# Patient Record
Sex: Male | Born: 1960
Health system: Southern US, Community
[De-identification: ages and names within clinical notes are randomized; demographics above are authoritative.]

## PROBLEM LIST (undated history)

## (undated) DIAGNOSIS — F32A Depression, unspecified: Secondary | ICD-10-CM

## (undated) DIAGNOSIS — K219 Gastro-esophageal reflux disease without esophagitis: Secondary | ICD-10-CM

## (undated) DIAGNOSIS — F329 Major depressive disorder, single episode, unspecified: Secondary | ICD-10-CM

## (undated) DIAGNOSIS — J302 Other seasonal allergic rhinitis: Secondary | ICD-10-CM

## (undated) HISTORY — DX: Depression, unspecified: F32.A

## (undated) HISTORY — DX: Gastro-esophageal reflux disease without esophagitis: K21.9

## (undated) HISTORY — DX: Major depressive disorder, single episode, unspecified: F32.9

---

## 2001-02-21 ENCOUNTER — Emergency Department (HOSPITAL_COMMUNITY): Admission: EM | Admit: 2001-02-21 | Discharge: 2001-02-21 | Payer: Self-pay | Admitting: Emergency Medicine

## 2001-02-21 ENCOUNTER — Encounter: Payer: Self-pay | Admitting: Emergency Medicine

## 2004-08-03 ENCOUNTER — Emergency Department (HOSPITAL_COMMUNITY): Admission: EM | Admit: 2004-08-03 | Discharge: 2004-08-03 | Payer: Self-pay | Admitting: *Deleted

## 2006-08-13 ENCOUNTER — Emergency Department (HOSPITAL_COMMUNITY): Admission: EM | Admit: 2006-08-13 | Discharge: 2006-08-13 | Payer: Self-pay | Admitting: Emergency Medicine

## 2006-09-19 ENCOUNTER — Emergency Department (HOSPITAL_COMMUNITY): Admission: EM | Admit: 2006-09-19 | Discharge: 2006-09-19 | Payer: Self-pay | Admitting: Emergency Medicine

## 2009-06-28 ENCOUNTER — Emergency Department (HOSPITAL_COMMUNITY): Admission: EM | Admit: 2009-06-28 | Discharge: 2009-06-30 | Payer: Self-pay | Admitting: Emergency Medicine

## 2010-11-11 LAB — DIFFERENTIAL
Basophils Absolute: 0 10*3/uL (ref 0.0–0.1)
Basophils Relative: 0 % (ref 0–1)
Eosinophils Absolute: 0.1 10*3/uL (ref 0.0–0.7)
Eosinophils Relative: 1 % (ref 0–5)
Lymphocytes Relative: 31 % (ref 12–46)
Lymphs Abs: 1.7 10*3/uL (ref 0.7–4.0)
Monocytes Absolute: 0.2 10*3/uL (ref 0.1–1.0)
Monocytes Relative: 4 % (ref 3–12)
Neutro Abs: 3.4 10*3/uL (ref 1.7–7.7)
Neutrophils Relative %: 64 % (ref 43–77)

## 2010-11-11 LAB — BASIC METABOLIC PANEL
BUN: 8 mg/dL (ref 6–23)
CO2: 29 mEq/L (ref 19–32)
Calcium: 9.7 mg/dL (ref 8.4–10.5)
Chloride: 107 mEq/L (ref 96–112)
Creatinine, Ser: 0.84 mg/dL (ref 0.4–1.5)
GFR calc Af Amer: >60 mL/min (ref >60)
GFR calc non Af Amer: >60 mL/min (ref >60)
Glucose, Bld: 87 mg/dL (ref 70–99)
Potassium: 3.9 mEq/L (ref 3.5–5.1)
Sodium: 143 mEq/L (ref 135–145)

## 2010-11-11 LAB — CBC
HCT: 46.9 % (ref 39.0–52.0)
Hemoglobin: 15.5 g/dL (ref 13.0–17.0)
MCHC: 33.1 g/dL (ref 30.0–36.0)
MCV: 92.9 fL (ref 78.0–100.0)
Platelets: 268 10*3/uL (ref 150–400)
RBC: 5.05 MIL/uL (ref 4.22–5.81)
RDW: 13.8 % (ref 11.5–15.5)
WBC: 5.4 10*3/uL (ref 4.0–10.5)

## 2010-11-11 LAB — RAPID URINE DRUG SCREEN, HOSP PERFORMED
Amphetamines: NOT DETECTED
Barbiturates: NOT DETECTED
Benzodiazepines: NOT DETECTED
Cocaine: NOT DETECTED
Opiates: NOT DETECTED
Tetrahydrocannabinol: POSITIVE — AB

## 2010-11-11 LAB — ETHANOL: Alcohol, Ethyl (B): <5 mg/dL (ref 0–10)

## 2012-09-03 ENCOUNTER — Emergency Department (HOSPITAL_COMMUNITY)
Admission: EM | Admit: 2012-09-03 | Discharge: 2012-09-03 | Disposition: A | Discharge status: Home / Self Care | Payer: Self-pay | Attending: Emergency Medicine | Admitting: Emergency Medicine

## 2012-09-03 ENCOUNTER — Encounter (HOSPITAL_COMMUNITY): Payer: Self-pay | Admitting: Emergency Medicine

## 2012-09-03 DIAGNOSIS — R112 Nausea with vomiting, unspecified: Secondary | ICD-10-CM | POA: Insufficient documentation

## 2012-09-03 DIAGNOSIS — R42 Dizziness and giddiness: Secondary | ICD-10-CM | POA: Insufficient documentation

## 2012-09-03 DIAGNOSIS — R001 Bradycardia, unspecified: Secondary | ICD-10-CM

## 2012-09-03 DIAGNOSIS — R1033 Periumbilical pain: Secondary | ICD-10-CM | POA: Insufficient documentation

## 2012-09-03 DIAGNOSIS — F17200 Nicotine dependence, unspecified, uncomplicated: Secondary | ICD-10-CM | POA: Insufficient documentation

## 2012-09-03 DIAGNOSIS — R109 Unspecified abdominal pain: Secondary | ICD-10-CM

## 2012-09-03 DIAGNOSIS — I498 Other specified cardiac arrhythmias: Secondary | ICD-10-CM | POA: Insufficient documentation

## 2012-09-03 DIAGNOSIS — R1110 Vomiting, unspecified: Secondary | ICD-10-CM

## 2012-09-03 LAB — COMPREHENSIVE METABOLIC PANEL
AST: 19 U/L (ref 0–37)
Albumin: 3.5 g/dL (ref 3.5–5.2)
CO2: 27 mEq/L (ref 19–32)
Calcium: 9 mg/dL (ref 8.4–10.5)
Creatinine, Ser: 0.95 mg/dL (ref 0.50–1.35)
GFR calc non Af Amer: >90 mL/min (ref >90)

## 2012-09-03 LAB — CBC WITH DIFFERENTIAL/PLATELET
Basophils Absolute: 0 10*3/uL (ref 0.0–0.1)
Basophils Relative: 0 % (ref 0–1)
Eosinophils Relative: 1 % (ref 0–5)
HCT: 41.2 % (ref 39.0–52.0)
MCH: 30.5 pg (ref 26.0–34.0)
MCHC: 33.5 g/dL (ref 30.0–36.0)
MCV: 90.9 fL (ref 78.0–100.0)
Monocytes Absolute: 0.4 10*3/uL (ref 0.1–1.0)
RDW: 12.9 % (ref 11.5–15.5)

## 2012-09-03 LAB — URINALYSIS, ROUTINE W REFLEX MICROSCOPIC
Glucose, UA: NEGATIVE mg/dL
Leukocytes, UA: NEGATIVE
Protein, ur: 30 mg/dL — AB

## 2012-09-03 LAB — ETHANOL: Alcohol, Ethyl (B): <11 mg/dL (ref 0–11)

## 2012-09-03 LAB — URINE MICROSCOPIC-ADD ON

## 2012-09-03 MED ORDER — ONDANSETRON HCL 4 MG/2ML IJ SOLN
4.0000 mg | Freq: Once | INTRAMUSCULAR | Status: AC
  Administered 2012-09-03: 4 mg via INTRAVENOUS
  Filled 2012-09-03: qty 2

## 2012-09-03 NOTE — ED Provider Notes (Signed)
History     CSN: [id number]  Arrival date & time 09/03/12  0043   First MD Initiated Contact with Patient 09/03/12 0103      Chief Complaint  Patient presents with  . Abdominal Pain     Patient is a 52 y.o. male presenting with abdominal pain. The history is provided by the patient.  Abdominal Pain The primary symptoms of the illness include abdominal pain, nausea and vomiting. The primary symptoms of the illness do not include fever, shortness of breath, diarrhea, hematochezia or dysuria. The current episode started 1 to 2 hours ago. The onset of the illness was sudden. The problem has been rapidly improving.  The patient has not had a change in bowel habit. Symptoms associated with the illness do not include constipation or back pain.  pt reports he ate fish, and soon after developed periumbilical pain and vomiting.  He denies diarrhea - he reports he actually had normal nonbloody BM.  No cp.  No sob.  No syncope.  He reports he did have brief dizziness immediately after vomiting but now resolved.  He reports his symptoms are essentially resolved.  He is otherwise well without any medical problems  PMH - none  Past Surgical History  Procedure Date  . No past surgeries     History reviewed. No pertinent family history.  History  Substance Use Topics  . Smoking status: Current Every Day Smoker -- 1.0 packs/day  . Smokeless tobacco: Not on file  . Alcohol Use: No      Review of Systems  Constitutional: Negative for fever.  Respiratory: Negative for chest tightness and shortness of breath.   Cardiovascular: Negative for chest pain.  Gastrointestinal: Positive for nausea, vomiting and abdominal pain. Negative for diarrhea, constipation, blood in stool and hematochezia.  Genitourinary: Negative for dysuria and testicular pain.  Musculoskeletal: Negative for back pain.  Neurological: Negative for syncope and weakness.  Psychiatric/Behavioral: Negative for agitation.  All  other systems reviewed and are negative.    Allergies  Review of patient's allergies indicates no known allergies.  Home Medications  No current outpatient prescriptions on file.  BP 108/42  Pulse 43  Temp 97.7 F (36.5 C) (Oral)  Resp 18  SpO2 100%  Physical Exam CONSTITUTIONAL: Well developed/well nourished HEAD AND FACE: Normocephalic/atraumatic EYES: EOMI/PERRL, no icterus ENMT: Mucous membranes moist NECK: supple no meningeal signs SPINE:entire spine nontender CV: S1/S2 noted, no murmurs/rubs/gallops noted LUNGS: Lungs are clear to auscultation bilaterally, no apparent distress ABDOMEN: soft, nontender, no rebound or guarding, +BS GU:no cva tenderness NEURO: Pt is awake/alert, moves all extremitiesx4 EXTREMITIES: pulses normal, full ROM SKIN: warm, color normal PSYCH: no abnormalities of mood noted  ED Course  Procedures    Labs Reviewed  CBC WITH DIFFERENTIAL  COMPREHENSIVE METABOLIC PANEL  LIPASE, BLOOD  URINALYSIS, ROUTINE W REFLEX MICROSCOPIC   By the time of my eval pt was already feeling better He had no symptoms here.  His abdominal exam was benign without focal tenderness on repeat exam.  He had no vomiting.  He reports feeling well.  Labs reassuring.  He had no lower abdominal/inguinal pain.  My suspicion for acute abdominal  process is low.  I gave him strict return precautions and what to monitor for over next 8-12 hours  For his sinus bradycardia - he is asymptomatic without any dizziness/syncope.  His HR quickly improves while in the room.  I have referred him to cardiology for this.     MDM  Nursing notes including past medical history and social history reviewed and considered in documentation Labs/vital reviewed and considered        Date: 09/03/2012  Rate: 41  Rhythm: sinus bradycardia  QRS Axis: normal  Intervals: normal  ST/T Wave abnormalities: normal  Conduction Disutrbances:none  Narrative Interpretation:   Old EKG Reviewed:  none available at time of interpretation    Joya Gaskins, MD 09/03/12 [phone]

## 2012-09-03 NOTE — ED Notes (Signed)
Pt states no pain at this time. Pt denies any complaints at this time.

## 2012-09-03 NOTE — ED Notes (Signed)
Patient complaining of abdominal pain, nausea, and vomiting that started two hours ago.  Denies diarrhea and changes in urine.  Denies chest pain.

## 2012-09-10 ENCOUNTER — Emergency Department (HOSPITAL_COMMUNITY)
Admission: EM | Admit: 2012-09-10 | Discharge: 2012-09-11 | Disposition: A | Discharge status: Home / Self Care | Payer: Self-pay | Attending: Emergency Medicine | Admitting: Emergency Medicine

## 2012-09-10 ENCOUNTER — Encounter (HOSPITAL_COMMUNITY): Payer: Self-pay

## 2012-09-10 DIAGNOSIS — R112 Nausea with vomiting, unspecified: Secondary | ICD-10-CM | POA: Insufficient documentation

## 2012-09-10 DIAGNOSIS — R509 Fever, unspecified: Secondary | ICD-10-CM | POA: Insufficient documentation

## 2012-09-10 DIAGNOSIS — K4090 Unilateral inguinal hernia, without obstruction or gangrene, not specified as recurrent: Secondary | ICD-10-CM | POA: Insufficient documentation

## 2012-09-10 DIAGNOSIS — R109 Unspecified abdominal pain: Secondary | ICD-10-CM | POA: Insufficient documentation

## 2012-09-10 DIAGNOSIS — F17200 Nicotine dependence, unspecified, uncomplicated: Secondary | ICD-10-CM | POA: Insufficient documentation

## 2012-09-10 NOTE — ED Notes (Signed)
Patient presents with c/o abdominal pain, nausea and vomiting that started about 10 pm. States that he ate some homemade baked beans around 4 pm although no one else who ate this is having any sx.

## 2012-09-11 ENCOUNTER — Encounter (HOSPITAL_COMMUNITY): Payer: Self-pay | Admitting: Radiology

## 2012-09-11 ENCOUNTER — Emergency Department (HOSPITAL_COMMUNITY): Payer: Self-pay

## 2012-09-11 LAB — URINALYSIS, ROUTINE W REFLEX MICROSCOPIC
Hgb urine dipstick: NEGATIVE
Ketones, ur: 15 mg/dL — AB
Leukocytes, UA: NEGATIVE
Protein, ur: NEGATIVE mg/dL
Urobilinogen, UA: 1 mg/dL (ref 0.0–1.0)

## 2012-09-11 LAB — CBC WITH DIFFERENTIAL/PLATELET
Eosinophils Relative: 0 % (ref 0–5)
HCT: 40.7 % (ref 39.0–52.0)
Hemoglobin: 13.5 g/dL (ref 13.0–17.0)
Lymphocytes Relative: 13 % (ref 12–46)
Lymphs Abs: 1.3 10*3/uL (ref 0.7–4.0)
MCV: 89.3 fL (ref 78.0–100.0)
Monocytes Absolute: 0.5 10*3/uL (ref 0.1–1.0)
Monocytes Relative: 5 % (ref 3–12)
RBC: 4.56 MIL/uL (ref 4.22–5.81)
WBC: 10.2 10*3/uL (ref 4.0–10.5)

## 2012-09-11 LAB — COMPREHENSIVE METABOLIC PANEL
CO2: 27 mEq/L (ref 19–32)
Calcium: 9.3 mg/dL (ref 8.4–10.5)
Creatinine, Ser: 0.96 mg/dL (ref 0.50–1.35)
GFR calc Af Amer: >90 mL/min (ref >90)
GFR calc non Af Amer: >90 mL/min (ref >90)
Glucose, Bld: 123 mg/dL — ABNORMAL HIGH (ref 70–99)
Total Bilirubin: 0.3 mg/dL (ref 0.3–1.2)

## 2012-09-11 MED ORDER — ONDANSETRON HCL 4 MG/2ML IJ SOLN
4.0000 mg | Freq: Once | INTRAMUSCULAR | Status: AC
  Administered 2012-09-11: 4 mg via INTRAVENOUS
  Filled 2012-09-11: qty 2

## 2012-09-11 MED ORDER — SODIUM CHLORIDE 0.9 % IV SOLN
1000.0000 mL | INTRAVENOUS | Status: DC
  Administered 2012-09-11: 1000 mL via INTRAVENOUS

## 2012-09-11 MED ORDER — SODIUM CHLORIDE 0.9 % IV SOLN
1000.0000 mL | Freq: Once | INTRAVENOUS | Status: AC
  Administered 2012-09-11: 1000 mL via INTRAVENOUS

## 2012-09-11 MED ORDER — IOHEXOL 300 MG/ML  SOLN
50.0000 mL | Freq: Once | INTRAMUSCULAR | Status: AC | PRN
  Administered 2012-09-11: 50 mL via ORAL

## 2012-09-11 MED ORDER — METOCLOPRAMIDE HCL 10 MG PO TABS: 10.0000 mg | ORAL_TABLET | Freq: Four times a day (QID) | ORAL | Status: DC | PRN

## 2012-09-11 MED ORDER — OXYCODONE-ACETAMINOPHEN 5-325 MG PO TABS: 1.0000 | ORAL_TABLET | ORAL | Status: DC | PRN

## 2012-09-11 MED ORDER — MORPHINE SULFATE 4 MG/ML IJ SOLN
4.0000 mg | Freq: Once | INTRAMUSCULAR | Status: DC
  Filled 2012-09-11: qty 1

## 2012-09-11 MED ORDER — IOHEXOL 300 MG/ML  SOLN
100.0000 mL | Freq: Once | INTRAMUSCULAR | Status: AC | PRN
  Administered 2012-09-11: 100 mL via INTRAVENOUS

## 2012-09-11 NOTE — ED Notes (Signed)
Patient transported to CT 

## 2012-09-11 NOTE — ED Provider Notes (Signed)
History     CSN: [id number]  Arrival date & time 09/10/12  2332   First MD Initiated Contact with Patient 09/10/12 2359      Chief Complaint  Patient presents with  . Abdominal Pain    (Consider location/radiation/quality/duration/timing/severity/associated sxs/prior treatment) Patient is a 52 y.o. male presenting with abdominal pain. The history is provided by the patient.  Abdominal Pain The primary symptoms of the illness include abdominal pain.  He states that he eats them being cystectomy in which he thinks may have been tainted. This evening, he developed crampy lower abdominal pain associated nausea and vomiting. Pain was severe and he rated it at 10/10. Pain has he's somewhat and is now rated at 5/10. He no longer is complaining of nausea. He denies diarrhea and states normal bowel movement earlier today. He had subjective fever and did break out in a sweat. He denies ethanol consumption. He did notice that pain was worse if he coughed.  History reviewed. No pertinent past medical history.  Past Surgical History  Procedure Date  . No past surgeries     No family history on file.  History  Substance Use Topics  . Smoking status: Current Every Day Smoker -- 1.0 packs/day  . Smokeless tobacco: Never Used  . Alcohol Use: No      Review of Systems  Gastrointestinal: Positive for abdominal pain.  All other systems reviewed and are negative.    Allergies  Review of patient's allergies indicates no known allergies.  Home Medications   Current Outpatient Rx  Name  Route  Sig  Dispense  Refill  . LORATADINE 10 MG PO TABS   Oral   Take 10 mg by mouth daily as needed. For allergies           BP 119/69  Pulse 46  Temp 97.4 F (36.3 C) (Oral)  Resp 18  SpO2 100%  Physical Exam  Nursing note and vitals reviewed.  52 year old male, resting comfortably and in no acute distress. Vital signs are significant for bradycardia with heart rate of 46. Oxygen  saturation is 100%, which is normal. Head is normocephalic and atraumatic. PERRLA, EOMI. Oropharynx is clear. Neck is nontender and supple without adenopathy or JVD. Back is nontender and there is no CVA tenderness. Lungs are clear without rales, wheezes, or rhonchi. Chest is nontender. Heart has regular rate and rhythm without murmur. Abdomen is soft, flat, with moderate tenderness in the left lower quadrant and mild tenderness in the right lower quadrant. There is no rebound or guarding. There are no masses or hepatosplenomegaly and peristalsis is slightly hypoactive. Extremities have no cyanosis or edema, full range of motion is present. Skin is warm and dry without rash. Neurologic: Mental status is normal, cranial nerves are intact, there are no motor or sensory deficits.  ED Course  Procedures (including critical care time)  Results for orders placed during the hospital encounter of 09/10/12  CBC WITH DIFFERENTIAL      Component Value Range   WBC 10.2  4.0 - 10.5 K/uL   RBC 4.56  4.22 - 5.81 MIL/uL   Hemoglobin 13.5  13.0 - 17.0 g/dL   HCT 84.6  96.2 - 95.2 %   MCV 89.3  78.0 - 100.0 fL   MCH 29.6  26.0 - 34.0 pg   MCHC 33.2  30.0 - 36.0 g/dL   RDW 84.1  32.4 - 40.1 %   Platelets 259  150 - 400 K/uL  Neutrophils Relative 82 (*) 43 - 77 %   Neutro Abs 8.3 (*) 1.7 - 7.7 K/uL   Lymphocytes Relative 13  12 - 46 %   Lymphs Abs 1.3  0.7 - 4.0 K/uL   Monocytes Relative 5  3 - 12 %   Monocytes Absolute 0.5  0.1 - 1.0 K/uL   Eosinophils Relative 0  0 - 5 %   Eosinophils Absolute 0.0  0.0 - 0.7 K/uL   Basophils Relative 0  0 - 1 %   Basophils Absolute 0.0  0.0 - 0.1 K/uL  COMPREHENSIVE METABOLIC PANEL      Component Value Range   Sodium 140  135 - 145 mEq/L   Potassium 3.6  3.5 - 5.1 mEq/L   Chloride 105  96 - 112 mEq/L   CO2 27  19 - 32 mEq/L   Glucose, Bld 123 (*) 70 - 99 mg/dL   BUN 10  6 - 23 mg/dL   Creatinine, Ser 5.62  0.50 - 1.35 mg/dL   Calcium 9.3  8.4 - 13.0  mg/dL   Total Protein 6.6  6.0 - 8.3 g/dL   Albumin 3.7  3.5 - 5.2 g/dL   AST 15  0 - 37 U/L   ALT 10  0 - 53 U/L   Alkaline Phosphatase 51  39 - 117 U/L   Total Bilirubin 0.3  0.3 - 1.2 mg/dL   GFR calc non Af Amer >90  >90 mL/min   GFR calc Af Amer >90  >90 mL/min  LIPASE, BLOOD      Component Value Range   Lipase 41  11 - 59 U/L  URINALYSIS, ROUTINE W REFLEX MICROSCOPIC      Component Value Range   Color, Urine YELLOW  YELLOW   APPearance HAZY (*) CLEAR   Specific Gravity, Urine 1.026  1.005 - 1.030   pH 5.5  5.0 - 8.0   Glucose, UA NEGATIVE  NEGATIVE mg/dL   Hgb urine dipstick NEGATIVE  NEGATIVE   Bilirubin Urine NEGATIVE  NEGATIVE   Ketones, ur 15 (*) NEGATIVE mg/dL   Protein, ur NEGATIVE  NEGATIVE mg/dL   Urobilinogen, UA 1.0  0.0 - 1.0 mg/dL   Nitrite NEGATIVE  NEGATIVE   Leukocytes, UA NEGATIVE  NEGATIVE   Ct Abdomen Pelvis W Contrast  09/11/2012  *RADIOLOGY REPORT*  Clinical Data: Abdominal pain, nausea, and vomiting.  CT ABDOMEN AND PELVIS WITH CONTRAST  Technique:  Multidetector CT imaging of the abdomen and pelvis was performed following the standard protocol during bolus administration of intravenous contrast.  Contrast: OMNIPAQUE IOHEXOL 300 MG/ML  SOLN  Comparison: The  Findings: Dependent atelectasis or fibrosis in the lung bases. Calcified granulomas are present.  Multiple sub centimeter circumscribed low attenuation lesions in the liver likely representing small cysts.  Low attenuation change adjacent to the falciform ligament is probably focal fatty infiltration.  No discrete solid nodules are demonstrated in the liver.  No bile duct dilatation.  The gallbladder, spleen, kidneys, and abdominal aorta are unremarkable.  There is a nodule in the left adrenal gland measuring 13 mm diameter.  Hounsfield unit measurements are indeterminate although by size criteria is likely represents an adenoma.  Mildly prominent retroperitoneal lymph nodes are present, with periaortic  nodes measuring up to about 9.6 mm.  This is nonspecific but likely to be reactive.  The stomach is not abnormally distended.  Lower abdominal small bowel loops are upper limits of normal caliber but contrast material flows through  suggesting no evidence of complete obstruction.  There is decompression of the terminal ileum and partial small bowel obstruction is not excluded.  Liquid stool throughout the nondistended colon suggesting colitis.  No free air or free fluid in the abdomen.  Pelvis:  There is a left inguinal hernia containing fat with infiltration suggesting fat necrosis.  No definite bowel content. The appendix is normal.  No evidence of diverticulitis.  The prostate gland is mildly enlarged.  Bladder wall is not thickened. No free or loculated pelvic fluid collections.  No significant pelvic lymphadenopathy.  Degenerative changes in the lumbar spine.  IMPRESSION: Prominence of the distal small bowel with decompressed ileum suggesting possible partial small bowel obstruction.  Liquid stool in the colon consistent with infectious or inflammatory process. Left inguinal hernia containing fat with stranding suggesting fat necrosis.   Original Report Authenticated By: Burman Nieves, M.D.       1. Abdominal pain   2. Nausea & vomiting   3. Left inguinal hernia       MDM  Abdominal pain and nausea which may be a viral illness and maybe food poisoning. He will be given IV hydration and laboratory workup has been initiated. Old records are reviewed and he had an ED visit about one week ago with a virtually identical presentation. Because of recurrent symptoms, CT scan will be obtained.  A CT shows evidence of a left inguinal hernia with probable fat necrosis and this probably is what is causing his pain. He does not have clinical signs and symptoms of colitis. Repeat abdominal exam is benign. He will be discharged with prescriptions for metoclopramide and Percocet and is to return should symptoms  worsen.      Dione Booze, MD 09/11/12 [phone]

## 2012-10-16 ENCOUNTER — Observation Stay (HOSPITAL_COMMUNITY)
Admission: EM | Admit: 2012-10-16 | Discharge: 2012-10-17 | Disposition: A | Discharge status: Home / Self Care | Payer: MEDICAID | Attending: General Surgery | Admitting: General Surgery

## 2012-10-16 ENCOUNTER — Encounter (HOSPITAL_COMMUNITY): Payer: Self-pay | Admitting: Anesthesiology

## 2012-10-16 ENCOUNTER — Encounter (HOSPITAL_COMMUNITY): Admission: EM | Disposition: A | Discharge status: Home / Self Care | Payer: Self-pay | Source: Home / Self Care

## 2012-10-16 ENCOUNTER — Encounter (HOSPITAL_COMMUNITY): Payer: Self-pay | Admitting: Emergency Medicine

## 2012-10-16 ENCOUNTER — Emergency Department (HOSPITAL_COMMUNITY): Payer: Self-pay | Admitting: Anesthesiology

## 2012-10-16 DIAGNOSIS — K4030 Unilateral inguinal hernia, with obstruction, without gangrene, not specified as recurrent: Principal | ICD-10-CM | POA: Insufficient documentation

## 2012-10-16 DIAGNOSIS — K460 Unspecified abdominal hernia with obstruction, without gangrene: Secondary | ICD-10-CM

## 2012-10-16 HISTORY — PX: INGUINAL HERNIA REPAIR: SHX194

## 2012-10-16 HISTORY — PX: HERNIA REPAIR: SHX51

## 2012-10-16 HISTORY — DX: Other seasonal allergic rhinitis: J30.2

## 2012-10-16 LAB — CBC WITH DIFFERENTIAL/PLATELET
Basophils Absolute: 0 10*3/uL (ref 0.0–0.1)
Basophils Relative: 0 % (ref 0–1)
Eosinophils Relative: 0 % (ref 0–5)
Lymphocytes Relative: 10 % — ABNORMAL LOW (ref 12–46)
MCHC: 34.5 g/dL (ref 30.0–36.0)
MCV: 89 fL (ref 78.0–100.0)
Monocytes Absolute: 0.4 10*3/uL (ref 0.1–1.0)
Platelets: 241 10*3/uL (ref 150–400)
RDW: 13.1 % (ref 11.5–15.5)
WBC: 9.8 10*3/uL (ref 4.0–10.5)

## 2012-10-16 LAB — POCT I-STAT, CHEM 8
BUN: 12 mg/dL (ref 6–23)
BUN: 11 mg/dL (ref 6–23)
Chloride: 109 mEq/L (ref 96–112)
Chloride: 109 mEq/L (ref 96–112)
HCT: 42 % (ref 39.0–52.0)
HCT: 38 % — ABNORMAL LOW (ref 39.0–52.0)
Sodium: 140 mEq/L (ref 135–145)
Sodium: 143 mEq/L (ref 135–145)
TCO2: 30 mmol/L (ref 0–100)
TCO2: 30 mmol/L (ref 0–100)

## 2012-10-16 SURGERY — REPAIR, HERNIA, INGUINAL, ADULT
Anesthesia: General | Site: Groin | Laterality: Left | Wound class: Clean

## 2012-10-16 MED ORDER — BUPIVACAINE HCL (PF) 0.25 % IJ SOLN
INTRAMUSCULAR | Status: AC
  Filled 2012-10-16: qty 30

## 2012-10-16 MED ORDER — SODIUM CHLORIDE 0.9 % IV SOLN
INTRAVENOUS | Status: DC
  Administered 2012-10-16 (×4): via INTRAVENOUS
  Administered 2012-10-17: 125 mL/h via INTRAVENOUS

## 2012-10-16 MED ORDER — 0.9 % SODIUM CHLORIDE (POUR BTL) OPTIME
TOPICAL | Status: DC | PRN
  Administered 2012-10-16: 1000 mL

## 2012-10-16 MED ORDER — SUCCINYLCHOLINE CHLORIDE 20 MG/ML IJ SOLN
INTRAMUSCULAR | Status: DC | PRN
  Administered 2012-10-16: 80 mg via INTRAVENOUS

## 2012-10-16 MED ORDER — LIDOCAINE HCL (CARDIAC) 20 MG/ML IV SOLN
INTRAVENOUS | Status: DC | PRN
  Administered 2012-10-16: 100 mg via INTRAVENOUS

## 2012-10-16 MED ORDER — CEFAZOLIN SODIUM-DEXTROSE 2-3 GM-% IV SOLR
2.0000 g | INTRAVENOUS | Status: AC
  Administered 2012-10-16: 2 g via INTRAVENOUS
  Filled 2012-10-16: qty 50

## 2012-10-16 MED ORDER — PROPOFOL 10 MG/ML IV BOLUS
INTRAVENOUS | Status: DC | PRN
  Administered 2012-10-16: 200 mg via INTRAVENOUS

## 2012-10-16 MED ORDER — CEFAZOLIN SODIUM 1-5 GM-% IV SOLN
2.0000 g | Freq: Once | INTRAVENOUS | Status: AC
  Administered 2012-10-16: 2 g via INTRAVENOUS

## 2012-10-16 MED ORDER — ONDANSETRON HCL 4 MG/2ML IJ SOLN: 4.0000 mg | Freq: Four times a day (QID) | INTRAMUSCULAR | Status: DC | PRN

## 2012-10-16 MED ORDER — LACTATED RINGERS IV SOLN: INTRAVENOUS | Status: DC | PRN

## 2012-10-16 MED ORDER — ONDANSETRON HCL 4 MG PO TABS: 4.0000 mg | ORAL_TABLET | Freq: Four times a day (QID) | ORAL | Status: DC | PRN

## 2012-10-16 MED ORDER — BUPIVACAINE HCL 0.25 % IJ SOLN
INTRAMUSCULAR | Status: DC | PRN
  Administered 2012-10-16: 10 mL

## 2012-10-16 MED ORDER — FENTANYL CITRATE 0.05 MG/ML IJ SOLN
INTRAMUSCULAR | Status: DC | PRN
  Administered 2012-10-16: 75 ug via INTRAVENOUS
  Administered 2012-10-16: 50 ug via INTRAVENOUS
  Administered 2012-10-16: 75 ug via INTRAVENOUS
  Administered 2012-10-16: 50 ug via INTRAVENOUS

## 2012-10-16 MED ORDER — PROPRANOLOL HCL 1 MG/ML IV SOLN
INTRAVENOUS | Status: DC | PRN
  Administered 2012-10-16: .5 mg via INTRAVENOUS

## 2012-10-16 MED ORDER — OXYCODONE-ACETAMINOPHEN 5-325 MG PO TABS
1.0000 | ORAL_TABLET | ORAL | Status: DC | PRN
  Administered 2012-10-16 – 2012-10-17 (×2): 2 via ORAL
  Filled 2012-10-16 (×2): qty 2

## 2012-10-16 MED ORDER — MORPHINE SULFATE 4 MG/ML IJ SOLN
4.0000 mg | Freq: Once | INTRAMUSCULAR | Status: AC
  Administered 2012-10-16: 4 mg via INTRAVENOUS
  Filled 2012-10-16: qty 1

## 2012-10-16 MED ORDER — HYDROMORPHONE HCL PF 1 MG/ML IJ SOLN
INTRAMUSCULAR | Status: AC
  Filled 2012-10-16: qty 1

## 2012-10-16 MED ORDER — HYDROMORPHONE HCL PF 1 MG/ML IJ SOLN: 1.0000 mg | INTRAMUSCULAR | Status: DC | PRN

## 2012-10-16 MED ORDER — ONDANSETRON HCL 4 MG/2ML IJ SOLN: 4.0000 mg | Freq: Once | INTRAMUSCULAR | Status: DC | PRN

## 2012-10-16 MED ORDER — HYDROMORPHONE HCL PF 1 MG/ML IJ SOLN
0.2500 mg | INTRAMUSCULAR | Status: DC | PRN
  Administered 2012-10-16 (×2): 0.5 mg via INTRAVENOUS

## 2012-10-16 MED ORDER — CEFAZOLIN SODIUM 1-5 GM-% IV SOLN
INTRAVENOUS | Status: AC
  Filled 2012-10-16: qty 100

## 2012-10-16 MED ORDER — ONDANSETRON HCL 4 MG/2ML IJ SOLN
INTRAMUSCULAR | Status: DC | PRN
  Administered 2012-10-16: 4 mg via INTRAVENOUS

## 2012-10-16 SURGICAL SUPPLY — 62 items
ADH SKN CLS APL DERMABOND .7 (GAUZE/BANDAGES/DRESSINGS) ×1
APL SKNCLS STERI-STRIP NONHPOA (GAUZE/BANDAGES/DRESSINGS) ×1
BENZOIN TINCTURE PRP APPL 2/3 (GAUZE/BANDAGES/DRESSINGS) ×2 IMPLANT
BLADE SURG 15 STRL LF DISP TIS (BLADE) ×1 IMPLANT
BLADE SURG 15 STRL SS (BLADE) ×2
BLADE SURG ROTATE 9660 (MISCELLANEOUS) ×1 IMPLANT
CHLORAPREP W/TINT 26ML (MISCELLANEOUS) ×2 IMPLANT
CLOTH BEACON ORANGE TIMEOUT ST (SAFETY) ×2 IMPLANT
COVER SURGICAL LIGHT HANDLE (MISCELLANEOUS) ×2 IMPLANT
DECANTER SPIKE VIAL GLASS SM (MISCELLANEOUS) ×2 IMPLANT
DERMABOND ADVANCED (GAUZE/BANDAGES/DRESSINGS) ×1
DERMABOND ADVANCED .7 DNX12 (GAUZE/BANDAGES/DRESSINGS) IMPLANT
DRAIN PENROSE 1/2X12 LTX STRL (WOUND CARE) ×1 IMPLANT
DRAPE LAPAROSCOPIC ABDOMINAL (DRAPES) ×1 IMPLANT
DRAPE LAPAROTOMY TRNSV 102X78 (DRAPE) IMPLANT
DRAPE UTILITY 15X26 W/TAPE STR (DRAPE) ×6 IMPLANT
DRSG TEGADERM 4X4.75 (GAUZE/BANDAGES/DRESSINGS) ×1 IMPLANT
ELECT CAUTERY BLADE 6.4 (BLADE) ×2 IMPLANT
ELECT REM PT RETURN 9FT ADLT (ELECTROSURGICAL) ×2
ELECTRODE REM PT RTRN 9FT ADLT (ELECTROSURGICAL) ×1 IMPLANT
GAUZE SPONGE 4X4 16PLY XRAY LF (GAUZE/BANDAGES/DRESSINGS) ×2 IMPLANT
GLOVE BIO SURGEON STRL SZ7.5 (GLOVE) ×2 IMPLANT
GLOVE BIOGEL PI IND STRL 6.5 (GLOVE) IMPLANT
GLOVE BIOGEL PI IND STRL 7.0 (GLOVE) IMPLANT
GLOVE BIOGEL PI IND STRL 8 (GLOVE) ×1 IMPLANT
GLOVE BIOGEL PI INDICATOR 6.5 (GLOVE) ×2
GLOVE BIOGEL PI INDICATOR 7.0 (GLOVE) ×2
GLOVE BIOGEL PI INDICATOR 8 (GLOVE) ×1
GLOVE ECLIPSE 6.5 STRL STRAW (GLOVE) ×1 IMPLANT
GLOVE SURG SS PI 7.0 STRL IVOR (GLOVE) ×2 IMPLANT
GOWN STRL NON-REIN LRG LVL3 (GOWN DISPOSABLE) ×6 IMPLANT
GOWN STRL REIN XL XLG (GOWN DISPOSABLE) ×2 IMPLANT
KIT BASIN OR (CUSTOM PROCEDURE TRAY) ×2 IMPLANT
KIT ROOM TURNOVER OR (KITS) ×2 IMPLANT
MESH ULTRAPRO 3X6 7.6X15CM (Mesh General) ×1 IMPLANT
NDL HYPO 25GX1X1/2 BEV (NEEDLE) ×1 IMPLANT
NEEDLE HYPO 25GX1X1/2 BEV (NEEDLE) ×2 IMPLANT
NS IRRIG 1000ML POUR BTL (IV SOLUTION) ×2 IMPLANT
PACK SURGICAL SETUP 50X90 (CUSTOM PROCEDURE TRAY) ×2 IMPLANT
PAD ARMBOARD 7.5X6 YLW CONV (MISCELLANEOUS) ×2 IMPLANT
PENCIL BUTTON HOLSTER BLD 10FT (ELECTRODE) ×2 IMPLANT
SPECIMEN JAR SMALL (MISCELLANEOUS) IMPLANT
SPONGE GAUZE 4X4 12PLY (GAUZE/BANDAGES/DRESSINGS) ×1 IMPLANT
SPONGE INTESTINAL PEANUT (DISPOSABLE) IMPLANT
STRIP CLOSURE SKIN 1/2X4 (GAUZE/BANDAGES/DRESSINGS) ×1 IMPLANT
SUT MNCRL AB 4-0 PS2 18 (SUTURE) ×2 IMPLANT
SUT PDS AB 0 CT 36 (SUTURE) IMPLANT
SUT PROLENE 2 0 SH DA (SUTURE) ×2 IMPLANT
SUT SILK 2 0 SH (SUTURE) ×2 IMPLANT
SUT SILK 3 0 (SUTURE)
SUT SILK 3-0 18XBRD TIE 12 (SUTURE) ×1 IMPLANT
SUT VIC AB 0 CT2 27 (SUTURE) ×2 IMPLANT
SUT VIC AB 2-0 SH 27 (SUTURE) ×2
SUT VIC AB 2-0 SH 27X BRD (SUTURE) ×1 IMPLANT
SUT VIC AB 3-0 SH 27 (SUTURE) ×2
SUT VIC AB 3-0 SH 27XBRD (SUTURE) ×1 IMPLANT
SYR CONTROL 10ML LL (SYRINGE) ×2 IMPLANT
TOWEL OR 17X24 6PK STRL BLUE (TOWEL DISPOSABLE) ×2 IMPLANT
TOWEL OR 17X26 10 PK STRL BLUE (TOWEL DISPOSABLE) ×2 IMPLANT
TRAY FOLEY CATH 14FR (SET/KITS/TRAYS/PACK) ×1 IMPLANT
TUBE CONNECTING 12X1/4 (SUCTIONS) IMPLANT
YANKAUER SUCT BULB TIP NO VENT (SUCTIONS) IMPLANT

## 2012-10-16 NOTE — Anesthesia Preprocedure Evaluation (Addendum)
Anesthesia Evaluation  Patient identified by MRN, date of birth, ID band Patient awake    Reviewed: Allergy & Precautions, H&P , NPO status , Patient's Chart, lab work & pertinent test results  Airway Mallampati: I TM Distance: >3 FB Neck ROM: full    Dental  (+) Teeth Intact   Pulmonary          Cardiovascular Rhythm:regular Rate:Normal     Neuro/Psych    GI/Hepatic   Endo/Other    Renal/GU      Musculoskeletal   Abdominal   Peds  Hematology   Anesthesia Other Findings   Reproductive/Obstetrics                          Anesthesia Physical Anesthesia Plan  ASA: II  Anesthesia Plan: General   Post-op Pain Management:    Induction: Intravenous  Airway Management Planned: LMA and Oral ETT  Additional Equipment:   Intra-op Plan:   Post-operative Plan: Extubation in OR  Informed Consent: I have reviewed the patients History and Physical, chart, labs and discussed the procedure including the risks, benefits and alternatives for the proposed anesthesia with the patient or authorized representative who has indicated his/her understanding and acceptance.     Plan Discussed with: CRNA, Anesthesiologist and Surgeon  Anesthesia Plan Comments:         Anesthesia Quick Evaluation

## 2012-10-16 NOTE — H&P (Signed)
Logan Bailey is an 52 y.o. male.   Chief Complaint: Nausea, vomiting, and abdominal pain HPI: This is a 52 year old gentleman with a known left inguinal hernia. He had a CAT scan in February demonstrating a left inguinal hernia containing only omentum. Last night he started having increasing left groin pain, nausea, and vomiting. The pain is described as both sharp and cramping. It is moderate to severe. It is now referring into the abdomen. His last bowel movement was yesterday. He has no other complaints.  History reviewed. No pertinent past medical history.  Past Surgical History  Procedure Laterality Date  . No past surgeries      No family history on file. Social History:  reports that he has been smoking.  He has never used smokeless tobacco. He reports that he uses illicit drugs (Marijuana). He reports that he does not drink alcohol.  Allergies: No Known Allergies   (Not in a hospital admission)  No results found for this or any previous visit (from the past 48 hour(s)). No results found.  Review of Systems  Constitutional: Negative.   HENT: Negative.   Eyes: Negative.   Respiratory: Negative.   Cardiovascular: Negative.   Gastrointestinal: Positive for nausea, vomiting and abdominal pain.  Genitourinary: Negative.   Musculoskeletal: Negative.   Skin: Negative.   Neurological: Negative.   Endo/Heme/Allergies: Negative.   Psychiatric/Behavioral: Negative.     Blood pressure 129/74, pulse 48, temperature 98 F (36.7 C), temperature source Oral, resp. rate 18, SpO2 100.00%. Physical Exam  Constitutional: He is oriented to person, place, and time. He appears well-developed and well-nourished.  He is mildly uncomfortable in appearance  HENT:  Head: Normocephalic and atraumatic.  Right Ear: External ear normal.  Left Ear: External ear normal.  Nose: Nose normal.  Mouth/Throat: Oropharynx is clear and moist. No oropharyngeal exudate.  Eyes: Conjunctivae are normal.  Pupils are equal, round, and reactive to light. Right eye exhibits no discharge. Left eye exhibits no discharge. No scleral icterus.  Neck: Normal range of motion. Neck supple. No tracheal deviation present.  Cardiovascular: Regular rhythm, normal heart sounds and intact distal pulses.   bradycardia  Respiratory: Effort normal and breath sounds normal. No respiratory distress. He has no wheezes.  GI: Soft. He exhibits no distension.  Abdomen is flat. There is an incarcerated left inguinal hernia which is quite tender. I cannot reduce the hernia. The rest of the abdomen is soft and nontender  Musculoskeletal: Normal range of motion. He exhibits no edema and no tenderness.  Lymphadenopathy:    He has no cervical adenopathy.  Neurological: He is alert and oriented to person, place, and time.  Skin: Skin is warm and dry. No rash noted. He is not diaphoretic. No erythema.  Psychiatric: His behavior is normal. Judgment normal.     Assessment/Plan Incarcerated left inguinal hernia.  Repair is recommended urgently. This may only contained omentum, but it is difficult to tell especially given his symptoms. I discussed this with him in detail. I discussed the risks of surgery which includes but is not limited to bleeding, infection, injury to surrounding structures, nerve entrapment, chronic pain, possible need for bowel resection, possible need for laparotomy. He understands and wishes to proceed.  BLACKMAN,DOUGLAS A 10/16/2012, 5:37 AM

## 2012-10-16 NOTE — ED Notes (Signed)
Surgery at bedside.

## 2012-10-16 NOTE — ED Provider Notes (Signed)
History     CSN: [id number]  Arrival date & time 10/16/12  4098   First MD Initiated Contact with Patient 10/16/12 [phone]      Chief Complaint  Patient presents with  . Abdominal Pain    (Consider location/radiation/quality/duration/timing/severity/associated sxs/prior treatment) HPI Patient complains of pain at site of hernia onset 10 PM yesterday. Symptoms accompanied by vomiting 3 times. He denies nausea at present. Last bowel movement yesterday evening, normal. Patient last ate at 6 PM last night. No fever pain is nonradiating at left inguinal region. Severe and sharp in quality. No other associated symptoms. History reviewed. No pertinent past medical history. Past medical history negative Past Surgical History  Procedure Laterality Date  . No past surgeries      No family history on file.  History  Substance Use Topics  . Smoking status: Current Every Day Smoker -- 1.00 packs/day  . Smokeless tobacco: Never Used  . Alcohol Use: No      Review of Systems  Constitutional: Negative.   HENT: Negative.   Respiratory: Negative.   Cardiovascular: Negative.   Gastrointestinal: Positive for vomiting and abdominal pain.  Musculoskeletal: Negative.   Skin: Negative.   Neurological: Negative.   Psychiatric/Behavioral: Negative.   All other systems reviewed and are negative.    Allergies  Review of patient's allergies indicates no known allergies.  Home Medications  No current outpatient prescriptions on file.  BP 129/74  Pulse 48  Resp 18  SpO2 100%  Physical Exam  Nursing note and vitals reviewed. Constitutional: He appears well-developed and well-nourished. He appears distressed.  Appears uncomfortable Glasgow Coma Score 15  HENT:  Head: Normocephalic and atraumatic.  Eyes: Conjunctivae are normal. Pupils are equal, round, and reactive to light.  Neck: Neck supple. No tracheal deviation present. No thyromegaly present.  Cardiovascular: Regular rhythm.    No murmur heard. Bradycardic  Pulmonary/Chest: Effort normal and breath sounds normal.  Abdominal: Soft. Bowel sounds are normal. He exhibits mass. He exhibits no distension. There is no tenderness.  Left inguinal hernia, golf ball size, exquisitely tender, not red or warm  Genitourinary: Penis normal.  Musculoskeletal: Normal range of motion. He exhibits no edema and no tenderness.  Neurological: He is alert. Coordination normal.  Skin: Skin is warm and dry. No rash noted.  Psychiatric: He has a normal mood and affect.    ED Course  Procedures (including critical care time)  Labs Reviewed  CBC WITH DIFFERENTIAL   No results found.  Date: 10/16/2012  Rate: 50  Rhythm: sinus bradycardia  QRS Axis: normal  Intervals: normal  ST/T Wave abnormalities: normal  Conduction Disutrbances:none  Narrative Interpretation:   Old EKG Reviewed: No significant change from 09/03/2012 interpreted by me   No diagnosis found.  Results for orders placed during the hospital encounter of 10/16/12  CBC WITH DIFFERENTIAL      Result Value Range   WBC 9.8  4.0 - 10.5 K/uL   RBC 4.63  4.22 - 5.81 MIL/uL   Hemoglobin 14.2  13.0 - 17.0 g/dL   HCT 47.8  29.5 - 62.1 %   MCV 89.0  78.0 - 100.0 fL   MCH 30.7  26.0 - 34.0 pg   MCHC 34.5  30.0 - 36.0 g/dL   RDW 30.8  65.7 - 84.6 %   Platelets 241  150 - 400 K/uL   Neutrophils Relative 86 (*) 43 - 77 %   Neutro Abs 8.5 (*) 1.7 - 7.7 K/uL   Lymphocytes  Relative 10 (*) 12 - 46 %   Lymphs Abs 1.0  0.7 - 4.0 K/uL   Monocytes Relative 4  3 - 12 %   Monocytes Absolute 0.4  0.1 - 1.0 K/uL   Eosinophils Relative 0  0 - 5 %   Eosinophils Absolute 0.0  0.0 - 0.7 K/uL   Basophils Relative 0  0 - 1 %   Basophils Absolute 0.0  0.0 - 0.1 K/uL  POCT I-STAT, CHEM 8      Result Value Range   Sodium 140  135 - 145 mEq/L   Potassium 6.9 (*) 3.5 - 5.1 mEq/L   Chloride 109  96 - 112 mEq/L   BUN 12  6 - 23 mg/dL   Creatinine, Ser 1.61  0.50 - 1.35 mg/dL    Glucose, Bld 096 (*) 70 - 99 mg/dL   Calcium, Ion 0.45 (*) 1.12 - 1.23 mmol/L   TCO2 30  0 - 100 mmol/L   Hemoglobin 14.3  13.0 - 17.0 g/dL   HCT 40.9  81.1 - 91.4 %   Comment NOTIFIED PHYSICIAN    POCT I-STAT, CHEM 8      Result Value Range   Sodium 143  135 - 145 mEq/L   Potassium 5.7 (*) 3.5 - 5.1 mEq/L   Chloride 109  96 - 112 mEq/L   BUN 11  6 - 23 mg/dL   Creatinine, Ser 7.82  0.50 - 1.35 mg/dL   Glucose, Bld 956 (*) 70 - 99 mg/dL   Calcium, Ion 2.13 (*) 1.12 - 1.23 mmol/L   TCO2 30  0 - 100 mmol/L   Hemoglobin 12.9 (*) 13.0 - 17.0 g/dL   HCT 08.6 (*) 57.8 - 46.9 %   No results found.  6:45 AM pain improved after treatment with morphine. MDM  Inguinal hernia felt to be incarcerated. Bradycardia felt to be inconsequential. Spoke with Dr. Magnus Ivan who came to see patient to evaluate for surgery and admission Diagnosis#1incarcerated left inguinal hernia #65mild hyperkalemia        Doug Sou, MD 10/16/12 [phone]

## 2012-10-16 NOTE — ED Notes (Signed)
Per ems-- pt c/o umbilical pain for some time, has been dx with umbilical hernia. No new pain tonight. Some nausea. Vs wnl.

## 2012-10-16 NOTE — Preoperative (Signed)
Beta Blockers   Reason not to administer Beta Blockers:Not Applicable 

## 2012-10-16 NOTE — Transfer of Care (Signed)
Immediate Anesthesia Transfer of Care Note  Patient: Logan Bailey  Procedure(s) Performed: Procedure(s): HERNIA REPAIR INGUINAL ADULT (Left)  Patient Location: PACU  Anesthesia Type:General  Level of Consciousness: sedated  Airway & Oxygen Therapy: Patient Spontanous Breathing and Patient connected to face mask oxygen  Post-op Assessment: Report given to PACU RN, Post -op Vital signs reviewed and stable and Patient moving all extremities X 4  Post vital signs: Reviewed and stable  Complications: No apparent anesthesia complications

## 2012-10-16 NOTE — Anesthesia Postprocedure Evaluation (Signed)
  Anesthesia Post-op Note  Patient: Logan Bailey  Procedure(s) Performed: Procedure(s): HERNIA REPAIR INGUINAL ADULT (Left)  Patient Location: PACU  Anesthesia Type:General  Level of Consciousness: awake, alert , oriented and patient cooperative  Airway and Oxygen Therapy: Patient Spontanous Breathing  Post-op Pain: mild  Post-op Assessment: Post-op Vital signs reviewed, Patient's Cardiovascular Status Stable, Respiratory Function Stable, Patent Airway, No signs of Nausea or vomiting and Pain level controlled  Post-op Vital Signs: stable  Complications: No apparent anesthesia complications

## 2012-10-16 NOTE — Op Note (Signed)
Pre Operative Diagnosis:  Incarcerated left inguinal hernia  Post Operative Diagnosis: same  Procedure: open left inguinal hernia.  Surgeon: Dr. Axel Filler  Assistant: none  Anesthesia: GETA  EBL: less than 5 cc  Complications: none  Counts: reported as correct x 2  Findings:  The patient left indirect inguinal hernia.  The hernia was reduced upon induction of anesthesia, and it was seen previously to be incarcerated.  Indications for procedure:  The patient is a 52 year old male who presented to the ED for a left inguinal area. The patient had been seen in the ED several days ago with left inguinal pain. CT scan revealed incarceration of fat. The patient presented today with similar symptoms is to proceed with fixation of left.  Details of the procedure:The patient was taken back to the operating room. The patient was placed in supine position with bilateral SCDs in place. After appropriate anitbiotics were confirmed, a time-out was confirmed and all facts were verified.  A 4 cm incision made just 1 cm superior to the inguinal ligament Bovie cautery was used to maintain hemostasis and dissection was taken down through Scarpa's fascia to the external oblique.  A stab incision was made laterally and the underlying tissue was brought to dissect away with Metzenbaum scissors to the external inguinal ring. The external oblique was elevated and the traumatic wound was dissected away from the external oblique superiorly and inferiorly. At the pubic tubercle was able to isolate the spermatic cord from the surrounding tissue and a Penrose drain was placed around this. I proceeded to dissect away the cremasteric muscles which were thickened around the cord. There was no incarcerated hernia at this time is to reduce to induction.  The spermatic vessels and the vas deferens was identified medially and protected all portions of the case. The Penrose in place around these structures.  Cremasterics were  removed off the hernia sac which was entered after we seen that this was a a indirect inguinal hernia dissection was taken down to the epigastric vessels.  The hernia sac was inspected there was no bowel or fat was contained within the hernia sac, and there was no sliding component.  High ligation of the hernia sac into place and was ligated with a 0 Vicryl tie. This was retracted back into the abdomen. A 3 x 6 centimeter Prolene piece a mesh was then cut to shape and anchored to the pubic tubercle using a 2-0 Prolene stitch. This was then run to the shelving edge of the external oblique inferiorly, and superiorly to the conjoined tendon. The tails were brought together and tucked under the external oblique muscle. The spermatic cord was not tight.  It was then checked for hemostasis which was excellent. The external oblique was then reapproximated using a 2-0 Vicryl in a running fashion. Scarpa's fascia is reapproximated with 3-0 Vicryl running fashion. The skin was then reapproximated for Monocryl in subcuticular fashion. The skin was then dressed with Dermabond.The patient was taken to the recovery room in stable condition.

## 2012-10-16 NOTE — ED Notes (Signed)
Critical labs shown to Dr.Jacubowitz, he requested that it be redrawn phlebotomy notified

## 2012-10-16 NOTE — Anesthesia Procedure Notes (Signed)
Procedure Name: Intubation Date/Time: 10/16/2012 8:48 AM Performed by: Quentin Ore Pre-anesthesia Checklist: Patient identified, Emergency Drugs available, Suction available, Patient being monitored and Timeout performed Patient Re-evaluated:Patient Re-evaluated prior to inductionOxygen Delivery Method: Circle system utilized Preoxygenation: Pre-oxygenation with 100% oxygen Intubation Type: Rapid sequence Laryngoscope Size: Mac and 3 Grade View: Grade I Tube type: Oral Tube size: 7.5 mm Number of attempts: 1 Airway Equipment and Method: Stylet and LTA kit utilized Placement Confirmation: ETT inserted through vocal cords under direct vision,  positive ETCO2 and breath sounds checked- equal and bilateral Secured at: 22 cm Tube secured with: Tape Dental Injury: Teeth and Oropharynx as per pre-operative assessment

## 2012-10-17 LAB — CBC
HCT: 38.6 % — ABNORMAL LOW (ref 39.0–52.0)
Hemoglobin: 12.7 g/dL — ABNORMAL LOW (ref 13.0–17.0)
MCH: 30.1 pg (ref 26.0–34.0)
MCV: 91.5 fL (ref 78.0–100.0)
RBC: 4.22 MIL/uL (ref 4.22–5.81)

## 2012-10-17 LAB — BASIC METABOLIC PANEL
CO2: 27 mEq/L (ref 19–32)
Calcium: 8.1 mg/dL — ABNORMAL LOW (ref 8.4–10.5)
Glucose, Bld: 91 mg/dL (ref 70–99)
Sodium: 139 mEq/L (ref 135–145)

## 2012-10-17 MED ORDER — OXYCODONE-ACETAMINOPHEN 5-325 MG PO TABS: 1.0000 | ORAL_TABLET | ORAL | Status: DC | PRN

## 2012-10-17 MED ORDER — ACETAMINOPHEN 325 MG PO TABS: ORAL_TABLET | ORAL | Status: DC

## 2012-10-17 NOTE — Progress Notes (Signed)
1 Day Post-Op  Subjective: Feels fine, has not been out of bed , waiting on a regular breakfast.  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  Objective: Vital signs in last 24 hours: Temp:  [97.2 F (36.2 C)-98.4 F (36.9 C)] 97.7 F (36.5 C) (03/11 0535) Pulse Rate:  [42-83] 51 (03/11 0535) Resp:  [8-20] 18 (03/11 0535) BP: (102-130)/(59-86) 106/59 mmHg (03/11 0535) SpO2:  [96 %-100 %] 97 % (03/11 0535) Weight:  [168 lb (76.204 kg)] 168 lb (76.204 kg) (03/10 1402)   Diet: Regular. Afebrile, VSS,  Intake/Output from previous day: 03/10 0701 - 03/11 0700 In: 4352.5 [P.O.:840; I.V.:3512.5] Out: 2425 [Urine:2400; Blood:25] Intake/Output this shift: Total I/O In: -  Out: 300 [Urine:300]  General appearance: alert, cooperative and no distress Resp: clear to auscultation bilaterally GI: soft, still a little tender, ice pack on Left groin. +BS no distension incision looks good.  Lab Results:   Recent Labs  10/16/12 0515 10/16/12 0604 10/16/12 0635  WBC 9.8  --   --   HGB 14.2 14.3 12.9*  HCT 41.2 42.0 38.0*  PLT 241  --   --     BMET  Recent Labs  10/16/12 0604 10/16/12 0635  NA 140 143  K 6.9* 5.7*  CL 109 109  GLUCOSE 122* 108*  BUN 12 11  CREATININE 0.90 0.90   PT/INR No results found for this basename: LABPROT, INR,  in the last 72 hours  No results found for this basename: AST, ALT, ALKPHOS, BILITOT, PROT, ALBUMIN,  in the last 168 hours   Lipase     Component Value Date/Time   LIPASE 41 09/11/2012 0013     Studies/Results: No results found.  Medications:    Assessment/Plan Incarcerated left inguinal hernia, s/p open left inguinal hernia, 10/16/2012  Axel Filler, MD  K+ elevated.    Plan:  I'm going to recheck labs, and if OK plan to send home later today.   LOS: 1 day    JENNINGS,WILLARD 10/17/2012

## 2012-10-17 NOTE — Progress Notes (Signed)
I have seen and examined the patient and agree with PA-Jennings note K+ WNL OK for home F/u in 2 weeks.

## 2012-10-17 NOTE — Discharge Summary (Signed)
Patient ID: Logan Bailey MRN: [id number] DOB/AGE: 1960/10/26 52 y.o.  Admit date: 10/16/2012 Discharge date: 10/17/2012  Procedures: open left inguinal hernia  Consults: None  Reason for Admission: This is a 52 year old gentleman with a known left inguinal hernia. He had a CAT scan in February demonstrating a left inguinal hernia containing only omentum. Last night he started having increasing left groin pain, nausea, and vomiting. The pain is described as both sharp and cramping. It is moderate to severe. It is now referring into the abdomen. His last bowel movement was yesterday. He has no other complaints.  Admission Diagnoses:  Incarcerated left inguinal hernia  Hospital Course: the patient was admitted and underwent a left inguinal hernia repair.  He tolerated it well.  On POD#1, he was tolerating a regular diet and his pain was well controlled.  He was stable for dc home.  Discharge Diagnoses:  1. Incarcerated left inguinal hernia, s/p open repair  Discharge Medications:   Medication List    TAKE these medications       acetaminophen 325 MG tablet  Commonly known as:  TYLENOL  Do not take more than 4000 mg of tylenol per day (24 hours)  Tylenol is in your percocet so you have to count it.     oxyCODONE-acetaminophen 5-325 MG per tablet  Commonly known as:  PERCOCET/ROXICET  Take 1-2 tablets by mouth every 4 (four) hours as needed.        Discharge Instructions:     Follow-up Information   Follow up with Lajean Saver, MD. Schedule an appointment as soon as possible for a visit in 2 weeks.   Contact information:   [address] [place] [address] [phone]       Signed: Letha Cape 10/17/2012, 1:06 PM

## 2012-10-18 ENCOUNTER — Encounter (HOSPITAL_COMMUNITY): Payer: Self-pay | Admitting: General Surgery

## 2015-04-11 ENCOUNTER — Emergency Department (HOSPITAL_COMMUNITY)
Admission: EM | Admit: 2015-04-11 | Discharge: 2015-04-11 | Disposition: A | Discharge status: Home / Self Care | Payer: Self-pay | Attending: Emergency Medicine | Admitting: Emergency Medicine

## 2015-04-11 ENCOUNTER — Encounter (HOSPITAL_COMMUNITY): Payer: Self-pay | Admitting: *Deleted

## 2015-04-11 DIAGNOSIS — Z720 Tobacco use: Secondary | ICD-10-CM | POA: Insufficient documentation

## 2015-04-11 DIAGNOSIS — Y9389 Activity, other specified: Secondary | ICD-10-CM | POA: Insufficient documentation

## 2015-04-11 DIAGNOSIS — Y998 Other external cause status: Secondary | ICD-10-CM | POA: Insufficient documentation

## 2015-04-11 DIAGNOSIS — T782XXA Anaphylactic shock, unspecified, initial encounter: Secondary | ICD-10-CM | POA: Insufficient documentation

## 2015-04-11 DIAGNOSIS — Y929 Unspecified place or not applicable: Secondary | ICD-10-CM | POA: Insufficient documentation

## 2015-04-11 DIAGNOSIS — W57XXXA Bitten or stung by nonvenomous insect and other nonvenomous arthropods, initial encounter: Secondary | ICD-10-CM | POA: Insufficient documentation

## 2015-04-11 MED ORDER — FAMOTIDINE IN NACL 20-0.9 MG/50ML-% IV SOLN
20.0000 mg | Freq: Once | INTRAVENOUS | Status: AC
  Administered 2015-04-11: 20 mg via INTRAVENOUS
  Filled 2015-04-11: qty 50

## 2015-04-11 MED ORDER — METHYLPREDNISOLONE SODIUM SUCC 125 MG IJ SOLR
125.0000 mg | Freq: Once | INTRAMUSCULAR | Status: AC
  Administered 2015-04-11: 125 mg via INTRAVENOUS
  Filled 2015-04-11: qty 2

## 2015-04-11 MED ORDER — RANITIDINE HCL 150 MG PO CAPS: 150.0000 mg | ORAL_CAPSULE | Freq: Two times a day (BID) | ORAL | Status: DC

## 2015-04-11 MED ORDER — PREDNISONE 10 MG PO TABS: 60.0000 mg | ORAL_TABLET | Freq: Every day | ORAL | Status: DC

## 2015-04-11 MED ORDER — DIPHENHYDRAMINE HCL 25 MG PO TABS: 25.0000 mg | ORAL_TABLET | Freq: Four times a day (QID) | ORAL | Status: DC

## 2015-04-11 MED ORDER — EPINEPHRINE 0.3 MG/0.3ML IJ SOAJ: 0.3000 mg | Freq: Once | INTRAMUSCULAR | Status: DC

## 2015-04-11 MED ORDER — EPINEPHRINE 0.3 MG/0.3ML IJ SOAJ: 0.3000 mg | Freq: Once | INTRAMUSCULAR | Status: AC

## 2015-04-11 MED ORDER — EPINEPHRINE 0.3 MG/0.3ML IJ SOAJ
INTRAMUSCULAR | Status: AC
  Administered 2015-04-11: 0.3 mg via INTRAMUSCULAR
  Filled 2015-04-11: qty 0.3

## 2015-04-11 MED ORDER — DIPHENHYDRAMINE HCL 50 MG/ML IJ SOLN
50.0000 mg | Freq: Once | INTRAMUSCULAR | Status: AC
  Administered 2015-04-11: 50 mg via INTRAVENOUS
  Filled 2015-04-11: qty 1

## 2015-04-11 NOTE — ED Provider Notes (Signed)
CSN: [id number]     Arrival date & time 04/11/15  1234 History   First MD Initiated Contact with Patient 04/11/15 1247     Chief Complaint  Patient presents with  . Allergic Reaction     (Consider location/radiation/quality/duration/timing/severity/associated sxs/prior Treatment) HPI Comments: The pt presents after sustaining a yellow jacket sting to the back of the head - this was acute in onset, constant pain and associated with immediate onset of rash, itching, sob and facial swelling - sx are persistent, severe and nothing makes better or worse - denies hx of allergic reactions in the past.  Patient is a 54 y.o. male presenting with allergic reaction. The history is provided by the patient.  Allergic Reaction   Past Medical History  Diagnosis Date  . Seasonal allergies    Past Surgical History  Procedure Laterality Date  . No past surgeries    . Hernia repair  10/16/2012    incarcerated  . Inguinal hernia repair Left 10/16/2012    Procedure: HERNIA REPAIR INGUINAL ADULT;  Surgeon: Axel Filler, MD;  Location: [facility] OR;  Service: General;  Laterality: Left;   No family history on file. Social History  Substance Use Topics  . Smoking status: Current Every Day Smoker -- 1.00 packs/day    Types: Cigarettes  . Smokeless tobacco: Never Used  . Alcohol Use: No    Review of Systems  All other systems reviewed and are negative.     Allergies  Review of patient's allergies indicates no known allergies.  Home Medications   Prior to Admission medications   Medication Sig Start Date End Date Taking? Authorizing Provider  acetaminophen (TYLENOL) 325 MG tablet Do not take more than 4000 mg of tylenol per day (24 hours) Tylenol is in your percocet so you have to count it. 10/17/12   Sherrie George, PA-C  oxyCODONE-acetaminophen (PERCOCET/ROXICET) 5-325 MG per tablet Take 1-2 tablets by mouth every 4 (four) hours as needed. 10/17/12   Sherrie George, PA-C   BP 136/83 mmHg   Pulse 48  Resp 11  SpO2 100% Physical Exam  Constitutional: He appears well-developed and well-nourished. No distress.  HENT:  Head: Normocephalic and atraumatic.  Mouth/Throat: Oropharynx is clear and moist. No oropharyngeal exudate.  Diffuse facial swelling over the lips and periorbital areas.  OP clear, phonation normal.  Eyes: Conjunctivae and EOM are normal. Pupils are equal, round, and reactive to light. Right eye exhibits no discharge. Left eye exhibits no discharge. No scleral icterus.  Neck: Normal range of motion. Neck supple. No JVD present. No thyromegaly present.  Cardiovascular: Normal rate, regular rhythm, normal heart sounds and intact distal pulses.  Exam reveals no gallop and no friction rub.   No murmur heard. Pulmonary/Chest: Effort normal and breath sounds normal. No respiratory distress. He has no wheezes. He has no rales.  Abdominal: Soft. Bowel sounds are normal. He exhibits no distension and no mass. There is no tenderness.  Musculoskeletal: Normal range of motion. He exhibits no edema or tenderness.  Lymphadenopathy:    He has no cervical adenopathy.  Neurological: He is alert. Coordination normal.  Skin: Skin is warm and dry. Rash noted. There is erythema.  Psychiatric: He has a normal mood and affect. His behavior is normal.  Nursing note and vitals reviewed.   ED Course  Procedures (including critical care time) Labs Review Labs Reviewed - No data to display  Imaging Review No results found. I have personally reviewed and evaluated these images and lab results  as part of my medical decision-making.  MDM   Final diagnoses:  None    Diffuse urticarial rash, likely has anaphylaxis, IM epi given on arrival, meds as below - fluids, observe at least 4 hours.Pt in agreement with paln.    Filed Vitals:   04/11/15 1400 04/11/15 1415 04/11/15 1430 04/11/15 1445  BP: 129/82 141/86 133/84 136/83  Pulse: 50 50 49 48  Resp: SpO2: 100% 100%  100% 100%   Change of shift - care signed out to Dr. Loretha Stapler.  Eber Hong, MD 04/11/15 [phone]

## 2015-04-11 NOTE — Discharge Instructions (Signed)

## 2015-04-11 NOTE — ED Notes (Signed)
Pt in from home c/o SOB with angioedema onset post yellow jacket being stung today on the back of his head, pt A&O x4, follows commands, speaks in complete sentences, NAD

## 2015-04-11 NOTE — ED Provider Notes (Signed)
6:27 PM observed for several hours without recurrence of symptoms.  Stable for dc home.  Given return precautions.  Dc with epi pen, steroids, benadryl, zantac.    Clinical Impression: 1. Anaphylaxis, initial encounter       Blake Divine, MD 04/11/15 [phone]

## 2015-04-11 NOTE — ED Notes (Signed)
Dr. Hyacinth Meeker made aware that patients heart rate in the low 40's or high 30's.  Advised by Dr. Hyacinth Meeker to walk the patient around the ED, monitoring HR on the dinamap.  Will give patient a [place] sandwich and beverage.

## 2016-10-31 ENCOUNTER — Encounter (HOSPITAL_COMMUNITY): Payer: Self-pay | Admitting: Emergency Medicine

## 2016-10-31 ENCOUNTER — Emergency Department (HOSPITAL_COMMUNITY): Payer: Self-pay

## 2016-10-31 ENCOUNTER — Emergency Department (HOSPITAL_COMMUNITY)
Admission: EM | Admit: 2016-10-31 | Discharge: 2016-10-31 | Disposition: A | Discharge status: Home / Self Care | Payer: Self-pay | Attending: Emergency Medicine | Admitting: Emergency Medicine

## 2016-10-31 DIAGNOSIS — R202 Paresthesia of skin: Secondary | ICD-10-CM | POA: Insufficient documentation

## 2016-10-31 DIAGNOSIS — F17210 Nicotine dependence, cigarettes, uncomplicated: Secondary | ICD-10-CM | POA: Insufficient documentation

## 2016-10-31 DIAGNOSIS — Z79899 Other long term (current) drug therapy: Secondary | ICD-10-CM | POA: Insufficient documentation

## 2016-10-31 DIAGNOSIS — G629 Polyneuropathy, unspecified: Secondary | ICD-10-CM

## 2016-10-31 DIAGNOSIS — M5416 Radiculopathy, lumbar region: Secondary | ICD-10-CM | POA: Insufficient documentation

## 2016-10-31 LAB — CBC
HCT: 44.9 % (ref 39.0–52.0)
Hemoglobin: 14.7 g/dL (ref 13.0–17.0)
MCH: 29.6 pg (ref 26.0–34.0)
MCHC: 32.7 g/dL (ref 30.0–36.0)
MCV: 90.5 fL (ref 78.0–100.0)
PLATELETS: 276 10*3/uL (ref 150–400)
RBC: 4.96 MIL/uL (ref 4.22–5.81)
RDW: 13.1 % (ref 11.5–15.5)
WBC: 4.8 10*3/uL (ref 4.0–10.5)

## 2016-10-31 LAB — BASIC METABOLIC PANEL
Anion gap: 7 (ref 5–15)
BUN: 9 mg/dL (ref 6–20)
CALCIUM: 9.3 mg/dL (ref 8.9–10.3)
CO2: 27 mmol/L (ref 22–32)
CREATININE: 0.98 mg/dL (ref 0.61–1.24)
Chloride: 107 mmol/L (ref 101–111)
GFR calc Af Amer: >60 mL/min (ref >60)
GLUCOSE: 85 mg/dL (ref 65–99)
POTASSIUM: 4.5 mmol/L (ref 3.5–5.1)
SODIUM: 141 mmol/L (ref 135–145)

## 2016-10-31 LAB — MAGNESIUM: Magnesium: 2.1 mg/dL (ref 1.7–2.4)

## 2016-10-31 MED ORDER — DEXAMETHASONE 4 MG PO TABS
10.0000 mg | ORAL_TABLET | Freq: Once | ORAL | Status: AC
  Administered 2016-10-31: 10 mg via ORAL
  Filled 2016-10-31: qty 3

## 2016-10-31 MED ORDER — CYCLOBENZAPRINE HCL 10 MG PO TABS: 10.0000 mg | ORAL_TABLET | Freq: Three times a day (TID) | ORAL | 0 refills | Status: DC | PRN

## 2016-10-31 MED ORDER — KETOROLAC TROMETHAMINE 60 MG/2ML IM SOLN
60.0000 mg | Freq: Once | INTRAMUSCULAR | Status: AC
  Administered 2016-10-31: 60 mg via INTRAMUSCULAR
  Filled 2016-10-31: qty 2

## 2016-10-31 MED ORDER — IBUPROFEN 600 MG PO TABS
600.0000 mg | ORAL_TABLET | Freq: Three times a day (TID) | ORAL | 0 refills | Status: DC | PRN
Start: 2016-10-31 — End: 2016-11-09

## 2016-10-31 NOTE — ED Notes (Signed)
Pt in x-ray at this time

## 2016-10-31 NOTE — ED Triage Notes (Signed)
Pt from home with c/o bilateral toe to leg and left hand numbness ongoing for months but feels worse today.  Pt in NAD, ambulatory, stroke neg, A&O.

## 2016-10-31 NOTE — ED Provider Notes (Signed)
[facility]-EMERGENCY DEPT Provider Note   CSN: [id number] Arrival date & time: 10/31/16  1215   By signing my name below, I, Clarisse GougeXavier Herndon, attest that this documentation has been prepared under the direction and in the presence of Shaune Pollackameron Leanda Padmore, MD. Electronically signed, Clarisse GougeXavier Herndon, ED Scribe. 10/31/16. 3:01 PM.   History   Chief Complaint Chief Complaint  Patient presents with  . Numbness   The history is provided by the patient and medical records. No language interpreter was used.    HPI Comments: Logan Bailey is a 56 y.o. male who presents to the Emergency Department complaining of worsened chronic bilateral extremity numbness. Pt reports associated back pain that is aching, throbbing, and cramp like. Pt adds pressure-like low back pain He notes numbness and pain radiating from the big toes of both lower extremities into ankles and the lower legs; he also notes tingling and cramping in bilateral fingers L > R. He notes his symptoms are mildly relieved with massages.. Pt is otherwise healthy though he takes zantac PRN for reflux. 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 Pt reports bone spurs in lower extremities. Pt denies chills, diaphoresis, difficulty walking, numbness in the buttocks, chest pain, recent trauma, incontinence and difficulty urinating or defecating.  Past Medical History:  Diagnosis Date  . Seasonal allergies     There are no active problems to display for this patient.   Past Surgical History:  Procedure Laterality Date  . HERNIA REPAIR  10/16/2012   incarcerated  . INGUINAL HERNIA REPAIR Left 10/16/2012   Procedure: HERNIA REPAIR INGUINAL ADULT;  Surgeon: Axel FillerArmando Ramirez, MD;  Location: [facility] OR;  Service: General;  Laterality: Left;  . NO PAST SURGERIES         Home Medications    Prior to Admission medications   Medication Sig Start Date End Date Taking? Authorizing Provider  acetaminophen (TYLENOL) 325 MG tablet Do not take more than  4000 mg of tylenol per day (24 hours) Tylenol is in your percocet so you have to count it. Patient not taking: Reported on 04/11/2015 10/17/12   Sherrie GeorgeWillard Jennings, PA-C  cyclobenzaprine (FLEXERIL) 10 MG tablet Take 1 tablet (10 mg total) by mouth 3 (three) times daily as needed for muscle spasms. 10/31/16   Shaune Pollackameron Ashawnti Tangen, MD  diphenhydrAMINE (BENADRYL) 25 MG tablet Take 1 tablet (25 mg total) by mouth every 6 (six) hours. 04/11/15   Blake DivineJohn Wofford, MD  EPINEPHrine (ADRENACLICK) 0.3 mg/0.3 mL IJ SOAJ injection Inject 0.3 mLs (0.3 mg total) into the muscle once. 04/11/15   Blake DivineJohn Wofford, MD  ibuprofen (ADVIL,MOTRIN) 600 MG tablet Take 1 tablet (600 mg total) by mouth every 8 (eight) hours as needed for moderate pain or cramping. 10/31/16   Shaune Pollackameron Sheri Prows, MD  oxyCODONE-acetaminophen (PERCOCET/ROXICET) 5-325 MG per tablet Take 1-2 tablets by mouth every 4 (four) hours as needed. Patient not taking: Reported on 04/11/2015 10/17/12   Sherrie GeorgeWillard Jennings, PA-C  predniSONE (DELTASONE) 10 MG tablet Take 6 tablets (60 mg total) by mouth daily. 04/12/15   Blake DivineJohn Wofford, MD  ranitidine (ZANTAC) 150 MG capsule Take 1 capsule (150 mg total) by mouth 2 (two) times daily. 04/11/15   Blake DivineJohn Wofford, MD    Family History History reviewed. No pertinent family history.  Social History Social History  Substance Use Topics  . Smoking status: Current Every Day Smoker    Packs/day: 1.00    Types: Cigarettes  . Smokeless tobacco: Never Used  . Alcohol use No  Allergies   Patient has no known allergies.   Review of Systems Review of Systems  Constitutional: Negative for chills.  Cardiovascular: Negative for chest pain.  Gastrointestinal: Negative for constipation.  Genitourinary: Negative for difficulty urinating and enuresis.  Musculoskeletal: Positive for arthralgias, back pain and myalgias. Negative for gait problem and joint swelling.  Skin: Negative for wound.  Allergic/Immunologic: Negative for immunocompromised  state.  Neurological: Positive for numbness.  Hematological: Does not bruise/bleed easily.  All other systems reviewed and are negative.    Physical Exam Updated Vital Signs BP (!) 131/96 (BP Location: Right Arm)   Pulse 78   Temp 97.5 F (36.4 C) (Oral)   Resp 18   Ht 6\' 2"  (1.88 m)   SpO2 99%   Physical Exam  Constitutional: He is oriented to person, place, and time. He appears well-developed and well-nourished. No distress.  HENT:  Head: Normocephalic and atraumatic.  Eyes: Conjunctivae are normal.  Neck: Neck supple.  Cardiovascular: Normal rate, regular rhythm and normal heart sounds.  Exam reveals no friction rub.   No murmur heard. Pulmonary/Chest: Effort normal and breath sounds normal. No respiratory distress. He has no wheezes. He has no rales.  Abdominal: He exhibits no distension.  Musculoskeletal: He exhibits no edema.  Neurological: He is alert and oriented to person, place, and time. He has normal strength. He is not disoriented. No cranial nerve deficit or sensory deficit. He exhibits normal muscle tone. Gait normal. GCS eye subscore is 4. GCS verbal subscore is 5. GCS motor subscore is 6.  Skin: Skin is warm. Capillary refill takes less than 2 seconds.  Psychiatric: He has a normal mood and affect.  Nursing note and vitals reviewed.   Spine Exam: Inspection/Palpation: Mild bilateral paraspinal TTP over lower lumbar spine, worse over SI joints. No deformity. No midline TTP. No overlying skin changes Strength: 5/5 throughout LE bilaterally (hip flexion/extension, adduction/abduction; knee flexion/extension; foot dorsiflexion/plantarflexion, inversion/eversion; great toe inversion) Sensation: Intact to light touch in proximal and distal LE bilaterally Reflexes: 2+ quadriceps and achilles reflexes   ED Treatments / Results  DIAGNOSTIC STUDIES: Oxygen Saturation is 99% on RA, normal by my interpretation.    COORDINATION OF CARE: 1:09 PM Discussed treatment  plan with pt at bedside and pt agreed to plan. Will order labs, imaging and medications.  Labs (all labs ordered are listed, but only abnormal results are displayed) Labs Reviewed  CBC  BASIC METABOLIC PANEL  MAGNESIUM    EKG  EKG Interpretation None       Radiology Dg Lumbar Spine 2-3 Views  Result Date: 10/31/2016 CLINICAL DATA:  Low back pain and bilateral leg pain for months, worsening leg numbness today, no known injury EXAM: LUMBAR SPINE - 2-3 VIEW COMPARISON:  09/11/2012 FINDINGS: Three views of the lumbar spine submitted. No acute fracture or subluxation. There is mild anterior spurring upper and lower endplate of L3 and L5 vertebral body. Mild to moderate anterior spurring upper and lower endplate of L4 vertebral body. Alignment and vertebral body heights are preserved. Mild disc space flattening with minimal anterior spurring at L5-S1 level. IMPRESSION: No acute fracture or subluxation. Mild degenerative changes as described above. Electronically Signed   By: Natasha Mead M.D.   On: 10/31/2016 14:01    Procedures Procedures (including critical care time)  Medications Ordered in ED Medications  ketorolac (TORADOL) injection 60 mg (60 mg Intramuscular Given 10/31/16 1450)  dexamethasone (DECADRON) tablet 10 mg (10 mg Oral Given 10/31/16 1450)  Initial Impression / Assessment and Plan / ED Course  I have reviewed the triage vital signs and the nursing notes.  Pertinent labs & imaging results that were available during my care of the patient were reviewed by me and considered in my medical decision making (see chart for details).    56 yo M with PMHx as above here with mild bilateral lower back pain with intermittent cramp like pain in bilateral legs. Pt also endorsing transient "tingling" sensation in hands and legs when he begins cramping. On arrival, VSS and WNL. Exam as above. No focal neurological deficits. No midline back TTP.  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 DDx includes peripheral neuropathy given his stocking glove distribution. He has no midline C or T spine TTP and do not suspect higher spine injury. No focal neuro deficits and distribution, history is not c/w TIA or CVA. No LE weakness, normal reflexes, and do not suspect cauda equina; no loss of bowel or bladder fxn. No fever, chills, or infectious sx to suggest osteo or epidural abscess. No h/o IVDU. Will check plain films, basic labs given neuropathy, and re-assess.  Plain films show no acute fx, but do show some degenerative changes. Labs are o/w very reassuring. Suspect his pain is 2/2 muscle spasms/cramps 2/2 physical labor, with possible mild lumbar radiculopathy. Will treat with NSAIDs, flexeril, and d/c with outpt follow-up. Return precautions given.  Final Clinical Impressions(s) / ED Diagnoses   Final diagnoses:  Paresthesias  Lumbar radiculopathy  Neuropathy (HCC)    New Prescriptions Discharge Medication List as of 10/31/2016  2:36 PM    START taking these medications   Details  cyclobenzaprine (FLEXERIL) 10 MG tablet Take 1 tablet (10 mg total) by mouth 3 (three) times daily as needed for muscle spasms., Starting Sun 10/31/2016, Print    ibuprofen (ADVIL,MOTRIN) 600 MG tablet Take 1 tablet (600 mg total) by mouth every 8 (eight) hours as needed for moderate pain or cramping., Starting Sun 10/31/2016, Print        I personally performed the services described in this documentation, which was scribed in my presence. The recorded information has been reviewed and is accurate.       Shaune Pollack, MD 10/31/16 1501

## 2016-10-31 NOTE — ED Notes (Signed)
Declined W/C at D/C and was escorted to lobby by RN. 

## 2016-10-31 NOTE — Discharge Instructions (Signed)
-   drink plenty of fluids - take ibuprofen (prescribed) every 8 hours for 5 days, then as needed - take muscle relaxant as needed; this takes 1-2 days to start working

## 2016-11-09 ENCOUNTER — Encounter (INDEPENDENT_AMBULATORY_CARE_PROVIDER_SITE_OTHER): Payer: Self-pay | Admitting: Physician Assistant

## 2016-11-09 ENCOUNTER — Ambulatory Visit (INDEPENDENT_AMBULATORY_CARE_PROVIDER_SITE_OTHER): Payer: Self-pay | Admitting: Physician Assistant

## 2016-11-09 VITALS — BP 116/72 | HR 71 | Temp 97.8°F | Ht 74.0 in | Wt 155.8 lb

## 2016-11-09 DIAGNOSIS — R202 Paresthesia of skin: Secondary | ICD-10-CM

## 2016-11-09 DIAGNOSIS — L84 Corns and callosities: Secondary | ICD-10-CM

## 2016-11-09 MED ORDER — GABAPENTIN 300 MG PO CAPS: 300.0000 mg | ORAL_CAPSULE | Freq: Three times a day (TID) | ORAL | 1 refills | Status: DC

## 2016-11-09 NOTE — Patient Instructions (Signed)

## 2016-11-09 NOTE — Progress Notes (Signed)
   Subjective:  Patient ID: Logan Bailey, male    DOB: 04/27/1961  Age: 56 y.o. MRN: [id number]  CC: calluses and f/u ED for LBP   HPI Logan Bailey is a 56 y.o. male with no pertinent PMH presents for f/u of ED visit. ED visit on 10/31/16 with muscular pain in the left lower back, feet, and hands. Has taken cyclobenzaprine and naproxen with relief. Still has mild pain but he has not finished treatment. He would also like to address multiple calluses on the soles of his feet. Calluses are painful when walking. Has applied bandages in attempt to cushion. Calluses have been present for many years.     ROS Review of Systems  Constitutional: Negative for chills, fever and malaise/fatigue.  Eyes: Negative for blurred vision.  Respiratory: Negative for shortness of breath.   Cardiovascular: Negative for chest pain and palpitations.  Gastrointestinal: Negative for abdominal pain and nausea.  Genitourinary: Negative for dysuria and hematuria.  Musculoskeletal: Positive for back pain. Negative for joint pain and myalgias.       Bilateral foot pain due to calluses  Skin: Negative for rash.  Neurological: Negative for tingling and headaches.  Psychiatric/Behavioral: Negative for depression. The patient is not nervous/anxious.     Objective:  BP 116/72 (BP Location: Left Arm, Patient Position: Sitting, Cuff Size: Normal)   Pulse 71   Temp 97.8 F (36.6 C) (Oral)   Ht  (1.88 m)   Wt 155 lb 12.8 oz (70.7 kg)   SpO2 100%   BMI 20.00 kg/m   BP/Weight 11/09/2016 10/31/2016 04/11/2015  Systolic BP 116 131 100  Diastolic BP 72 96 88  Wt. (Lbs) 155.8 - -  BMI 20 - -      Physical Exam  Constitutional: He is oriented to person, place, and time.  Thin, NAD, polite  HENT:  Head: Normocephalic and atraumatic.  Eyes: No scleral icterus.  Neck: Normal range of motion. Neck supple. No thyromegaly present.  Cardiovascular: Normal rate, regular rhythm and normal heart sounds.    Pulmonary/Chest: Effort normal and breath sounds normal.  Abdominal: Soft. Bowel sounds are normal. There is no tenderness.  Musculoskeletal: He exhibits no edema.  Neurological: He is alert and oriented to person, place, and time. No cranial nerve deficit. He exhibits normal muscle tone. Coordination normal.  UE/LE strength 5/5 bilaterally and light touch sensation intact bilaterally.   Skin: Skin is warm and dry. No rash noted. No erythema. No pallor.  Psychiatric: He has a normal mood and affect. His behavior is normal. Thought content normal.  Vitals reviewed.    Assessment & Plan:   1. Plantar callus - Multiple calluses on soles of feet - Ambulatory referral to Podiatry  2. Paresthesia - gabapentin (NEURONTIN) 300 MG capsule; Take 1 capsule (300 mg total) by mouth 3 (three) times daily.  Dispense: 90 capsule; Refill: 1. Patient instructed on how to uptitrate gabapentin.  - Advised to take Vit B12 with folate  Meds ordered this encounter  Medications  . gabapentin (NEURONTIN) 300 MG capsule    Sig: Take 1 capsule (300 mg total) by mouth 3 (three) times daily.    Dispense:  90 capsule    Refill:  1    Order Specific Question:   Supervising Provider    Answer:   Quentin Angst [id number]    Follow-up: Return in about 4 weeks (around 12/07/2016) for f/u paresthesia.   Loletta Specter PA

## 2016-12-13 ENCOUNTER — Ambulatory Visit (INDEPENDENT_AMBULATORY_CARE_PROVIDER_SITE_OTHER): Payer: Self-pay | Admitting: Physician Assistant

## 2016-12-14 ENCOUNTER — Telehealth (INDEPENDENT_AMBULATORY_CARE_PROVIDER_SITE_OTHER): Payer: Self-pay | Admitting: Physician Assistant

## 2016-12-14 ENCOUNTER — Ambulatory Visit (INDEPENDENT_AMBULATORY_CARE_PROVIDER_SITE_OTHER): Payer: Self-pay | Admitting: Physician Assistant

## 2016-12-14 ENCOUNTER — Encounter (INDEPENDENT_AMBULATORY_CARE_PROVIDER_SITE_OTHER): Payer: Self-pay | Admitting: Physician Assistant

## 2016-12-14 VITALS — BP 112/57 | HR 62 | Temp 97.8°F | Resp 18 | Ht 74.0 in | Wt 172.0 lb

## 2016-12-14 DIAGNOSIS — M5442 Lumbago with sciatica, left side: Secondary | ICD-10-CM

## 2016-12-14 DIAGNOSIS — Z1159 Encounter for screening for other viral diseases: Secondary | ICD-10-CM

## 2016-12-14 DIAGNOSIS — M5441 Lumbago with sciatica, right side: Secondary | ICD-10-CM

## 2016-12-14 DIAGNOSIS — G8929 Other chronic pain: Secondary | ICD-10-CM

## 2016-12-14 DIAGNOSIS — L84 Corns and callosities: Secondary | ICD-10-CM

## 2016-12-14 DIAGNOSIS — R202 Paresthesia of skin: Secondary | ICD-10-CM

## 2016-12-14 MED ORDER — GABAPENTIN 300 MG PO CAPS: ORAL_CAPSULE | ORAL | 3 refills | Status: DC

## 2016-12-14 MED ORDER — GABAPENTIN 300 MG PO CAPS: 300.0000 mg | ORAL_CAPSULE | Freq: Three times a day (TID) | ORAL | 1 refills | Status: DC

## 2016-12-14 NOTE — Patient Instructions (Signed)
Corns and Calluses Corns are small areas of thickened skin that occur on the top, sides, or tip of a toe. They contain a cone-shaped core with a point that can press on a nerve below. This causes pain. Calluses are areas of thickened skin that can occur anywhere on the body including hands, fingers, palms, soles of the feet, and heels.Calluses are usually larger than corns. What are the causes? Corns and calluses are caused by rubbing (friction) or pressure, such as from shoes that are too tight or do not fit properly. What increases the risk? Corns are more likely to develop in people who have toe deformities, such as hammer toes. Since calluses can occur with friction to any area of the skin, calluses are more likely to develop in people who:  Work with their hands.  Wear shoes that fit poorly, shoes that are too tight, or shoes that are high-heeled.  Have toes deformities. What are the signs or symptoms? Symptoms of a corn or callus include:  A hard growth on the skin.  Pain or tenderness under the skin.  Redness and swelling.  Increased discomfort while wearing tight-fitting shoes. How is this diagnosed? Corns and calluses may be diagnosed with a medical history and physical exam. How is this treated? Corns and calluses may be treated with:  Removing the cause of the friction or pressure. This may include:  Changing your shoes.  Wearing shoe inserts (orthotics) or other protective layers in your shoes, such as a corn pad.  Wearing gloves.  Medicines to help soften skin in the hardened, thickened areas.  Reducing the size of the corn or callus by removing the dead layers of skin.  Antibiotic medicines to treat infection.  Surgery, if a toe deformity is the cause. Follow these instructions at home:  Take medicines only as directed by your health care provider.  If you were prescribed an antibiotic, finish all of it even if you start to feel better.  Wear shoes that  fit well. Avoid wearing high-heeled shoes and shoes that are too tight or too loose.  Wear any padding, protective layers, gloves, or orthotics as directed by your health care provider.  Soak your hands or feet and then use a file or pumice stone to soften your corn or callus. Do this as directed by your health care provider.  Check your corn or callus every day for signs of infection. Watch for:  Redness, swelling, or pain.  Fluid, blood, or pus. Contact a health care provider if:  Your symptoms do not improve with treatment.  You have increased redness, swelling, or pain at the site of your corn or callus.  You have fluid, blood, or pus coming from your corn or callus.  You have new symptoms. This information is not intended to replace advice given to you by your health care provider. Make sure you discuss any questions you have with your health care provider. Document Released: 05/01/2004 Document Revised: 02/13/2016 Document Reviewed: 07/22/2014 Elsevier Interactive Patient Education  2017 [employer]  

## 2016-12-14 NOTE — Progress Notes (Signed)
Subjective:  Patient ID: Logan Bailey, male    DOB: 03/29/1961  Age: 56 y.o. MRN: [id number]  CC: f/u callus and paresthesia  HPI Logan Bailey is a 56 y.o. male with no pertinent PMH presents to f/u on calluses and paresthesia. Paresthesia is ascending from toes to the lower back L > R. Described as tingling and numbness. Pt states that his LE paresthesias are reduced by 70% while on Gabapentin 300mg  TID. No side effects reported on Gabapentin. Also taking the Vit B12 that was recommended. In regards to calluses, says he has not received a call from podiatry but admits today that he does not have a phone. Does not have any other complaints or symptoms.     Outpatient Medications Prior to Visit  Medication Sig Dispense Refill  . ranitidine (ZANTAC) 150 MG capsule Take 1 capsule (150 mg total) by mouth 2 (two) times daily. 6 capsule 0  . cyclobenzaprine (FLEXERIL) 10 MG tablet Take 1 tablet (10 mg total) by mouth 3 (three) times daily as needed for muscle spasms. 30 tablet 0  . gabapentin (NEURONTIN) 300 MG capsule Take 1 capsule (300 mg total) by mouth 3 (three) times daily. 90 capsule 1   No facility-administered medications prior to visit.      ROS Review of Systems  Constitutional: Negative for chills, fever and malaise/fatigue.  Eyes: Negative for blurred vision.  Respiratory: Negative for shortness of breath.   Cardiovascular: Negative for chest pain and palpitations.  Gastrointestinal: Negative for abdominal pain and nausea.  Genitourinary: Negative for dysuria and hematuria.  Musculoskeletal: Positive for back pain. Negative for joint pain and myalgias.  Skin: Negative for rash.       Calluses on soles of feet  Neurological: Positive for tingling. Negative for headaches.  Psychiatric/Behavioral: Negative for depression. The patient is not nervous/anxious.     Objective:  BP (!) 112/57 (BP Location: Left Arm, Patient Position: Sitting, Cuff Size: Normal)   Pulse 62    Temp 97.8 F (36.6 C) (Oral)   Resp 18   Ht 6\' 2"  (1.88 m)   Wt 172 lb (78 kg)   SpO2 98%   BMI 22.08 kg/m   BP/Weight 12/14/2016 11/09/2016 10/31/2016  Systolic BP 112 116 131  Diastolic BP 57 72 96  Wt. (Lbs) 172 155.8 -  BMI 22.08 20 -      Physical Exam  Constitutional: He is oriented to person, place, and time.  Well developed, thin, NAD, polite  HENT:  Head: Normocephalic and atraumatic.  Eyes: No scleral icterus.  Neck: Normal range of motion. Neck supple. No thyromegaly present.  Cardiovascular: Normal rate, regular rhythm and normal heart sounds.   Pulmonary/Chest: Effort normal and breath sounds normal.  Musculoskeletal: He exhibits no edema.  Neurological: He is alert and oriented to person, place, and time. No cranial nerve deficit. Coordination normal.  Patellar DTR 2+ bilaterally  Skin: Skin is warm and dry. No rash noted. No erythema. No pallor.  Calluses on soles of feet  Psychiatric: He has a normal mood and affect. His behavior is normal. Thought content normal.  Vitals reviewed.    Assessment & Plan:   1. Plantar callus - Referral to podiatry made again. I asked patient to give [place] a reliable number to get in contact with him.  2. Chronic bilateral low back pain with bilateral sciatica - Increase Gabapentin 300 MG capsule One cap in am, one cap at noon, and two caps at bedtime. - Mild  degenerative changes on XR Lspine on 10/31/16.  3. Paresthesia - Comprehensive metabolic panel - HIV antibody - Uric Acid - ANA - Increase gabapentin (NEURONTIN) 300 MG capsule One cap in am, one cap at noon, and two caps at bedtime. - Mg normal, CBC normal, BMP normal on 10/31/16.  4. Need for hepatitis C screening test - Hepatitis C antibody   Meds ordered this encounter  Medications  . gabapentin (NEURONTIN) 300 MG capsule    Sig: Take 1 capsule (300 mg total) by mouth 3 (three) times daily.    Dispense:  90 capsule    Refill:  1    Order Specific Question:    Supervising Provider    Answer:   Quentin AngstJEGEDE, OLUGBEMIGA E [[id number]]    Follow-up: Return in about 4 weeks (around 01/11/2017) for full physical.   Loletta Specteroger David Gomez PA

## 2016-12-14 NOTE — Telephone Encounter (Signed)
Per patients sister, Erie NoeVanessa calling to find out why today's appt was cancelled,  I did explain to sister that appt canceled when automated system called them to confirmed. He was seen today.

## 2016-12-14 NOTE — Progress Notes (Signed)
Patient is here for FU  Patient complains of lower back pain being present and scaled at a 5 currently.  Patient has taken medication today. Patient has eaten today.

## 2016-12-15 LAB — COMPREHENSIVE METABOLIC PANEL
ALBUMIN: 4.3 g/dL (ref 3.5–5.5)
ALT: 16 IU/L (ref 0–44)
AST: 16 IU/L (ref 0–40)
Albumin/Globulin Ratio: 2.3 — ABNORMAL HIGH (ref 1.2–2.2)
Alkaline Phosphatase: 47 IU/L (ref 39–117)
BUN / CREAT RATIO: 16 (ref 9–20)
BUN: 15 mg/dL (ref 6–24)
Bilirubin Total: 0.2 mg/dL (ref 0.0–1.2)
CALCIUM: 9.1 mg/dL (ref 8.7–10.2)
CO2: 23 mmol/L (ref 18–29)
CREATININE: 0.96 mg/dL (ref 0.76–1.27)
Chloride: 105 mmol/L (ref 96–106)
GFR, EST AFRICAN AMERICAN: 102 mL/min/{1.73_m2} (ref >59)
GFR, EST NON AFRICAN AMERICAN: 89 mL/min/{1.73_m2} (ref >59)
GLUCOSE: 95 mg/dL (ref 65–99)
Globulin, Total: 1.9 g/dL (ref 1.5–4.5)
Potassium: 4.1 mmol/L (ref 3.5–5.2)
Sodium: 143 mmol/L (ref 134–144)
TOTAL PROTEIN: 6.2 g/dL (ref 6.0–8.5)

## 2016-12-15 LAB — URIC ACID: Uric Acid: 5 mg/dL (ref 3.7–8.6)

## 2016-12-15 LAB — ANA: Anti Nuclear Antibody(ANA): NEGATIVE

## 2016-12-15 LAB — HIV ANTIBODY (ROUTINE TESTING W REFLEX): HIV SCREEN 4TH GENERATION: NONREACTIVE

## 2016-12-15 LAB — HEPATITIS C ANTIBODY

## 2016-12-16 ENCOUNTER — Telehealth (INDEPENDENT_AMBULATORY_CARE_PROVIDER_SITE_OTHER): Payer: Self-pay | Admitting: Physician Assistant

## 2016-12-16 NOTE — Telephone Encounter (Signed)
Patient called requesting lab results,  Per patient call him back at ph: [phone]

## 2016-12-21 ENCOUNTER — Encounter (INDEPENDENT_AMBULATORY_CARE_PROVIDER_SITE_OTHER): Payer: Self-pay

## 2016-12-21 NOTE — Telephone Encounter (Signed)
Results mailed. Maryjean Mornempestt S Roberts, CMA

## 2017-01-14 ENCOUNTER — Ambulatory Visit (INDEPENDENT_AMBULATORY_CARE_PROVIDER_SITE_OTHER): Payer: Self-pay

## 2017-01-17 ENCOUNTER — Ambulatory Visit (INDEPENDENT_AMBULATORY_CARE_PROVIDER_SITE_OTHER): Payer: Self-pay | Admitting: Physician Assistant

## 2017-02-07 ENCOUNTER — Encounter (INDEPENDENT_AMBULATORY_CARE_PROVIDER_SITE_OTHER): Payer: Self-pay | Admitting: Physician Assistant

## 2017-02-07 ENCOUNTER — Ambulatory Visit (INDEPENDENT_AMBULATORY_CARE_PROVIDER_SITE_OTHER): Payer: Self-pay | Admitting: Physician Assistant

## 2017-02-07 VITALS — BP 124/77 | HR 41 | Temp 97.6°F | Wt 154.0 lb

## 2017-02-07 DIAGNOSIS — Z5321 Procedure and treatment not carried out due to patient leaving prior to being seen by health care provider: Secondary | ICD-10-CM

## 2017-02-07 NOTE — Progress Notes (Signed)
BP/Weight 02/07/2017 12/14/2016 11/09/2016  Systolic BP 124 112 116  Diastolic BP 77 57 72  Wt. (Lbs) 154 172 155.8  BMI 19.77 22.08 20

## 2017-02-07 NOTE — Progress Notes (Signed)
  Subjective:  Patient ID: Logan Bailey, male    DOB: 1961-02-28  Age: 56 y.o. MRN: [id number]  CC: annual exam  HPI Logan Bailey is a 56 y.o. male with a PMH of calluses and seasonal allergies presents for annual exam.     ROS ROS  Objective:  BP 124/77 (BP Location: Left Arm, Patient Position: Sitting, Cuff Size: Normal)   Pulse (!) 41   Temp 97.6 F (36.4 C) (Oral)   Wt 154 lb (69.9 kg)   SpO2 98%   BMI 19.77 kg/m   BP/Weight 02/07/2017 12/14/2016 11/09/2016  Systolic BP 124 112 116  Diastolic BP 77 57 72  Wt. (Lbs) 154 172 155.8  BMI 19.77 22.08 20      Physical Exam   Assessment & Plan:   1. Patient left without being seen  Follow-up: Return if symptoms worsen or fail to improve.   Loletta Specteroger David Gomez PA

## 2017-07-30 ENCOUNTER — Other Ambulatory Visit: Payer: Self-pay

## 2017-07-30 ENCOUNTER — Encounter (HOSPITAL_COMMUNITY): Payer: Self-pay | Admitting: Emergency Medicine

## 2017-07-30 ENCOUNTER — Emergency Department (HOSPITAL_COMMUNITY)
Admission: EM | Admit: 2017-07-30 | Discharge: 2017-08-03 | Disposition: A | Discharge status: Home / Self Care | Payer: Self-pay | Attending: Emergency Medicine | Admitting: Emergency Medicine

## 2017-07-30 DIAGNOSIS — F32A Depression, unspecified: Secondary | ICD-10-CM

## 2017-07-30 DIAGNOSIS — Z79899 Other long term (current) drug therapy: Secondary | ICD-10-CM | POA: Insufficient documentation

## 2017-07-30 DIAGNOSIS — F17210 Nicotine dependence, cigarettes, uncomplicated: Secondary | ICD-10-CM | POA: Insufficient documentation

## 2017-07-30 DIAGNOSIS — F329 Major depressive disorder, single episode, unspecified: Secondary | ICD-10-CM | POA: Insufficient documentation

## 2017-07-30 DIAGNOSIS — R440 Auditory hallucinations: Secondary | ICD-10-CM | POA: Insufficient documentation

## 2017-07-30 DIAGNOSIS — M79673 Pain in unspecified foot: Secondary | ICD-10-CM | POA: Insufficient documentation

## 2017-07-30 DIAGNOSIS — M79606 Pain in leg, unspecified: Secondary | ICD-10-CM | POA: Insufficient documentation

## 2017-07-30 DIAGNOSIS — G894 Chronic pain syndrome: Secondary | ICD-10-CM | POA: Insufficient documentation

## 2017-07-30 LAB — CBC WITH DIFFERENTIAL/PLATELET
BASOS PCT: 0 %
Basophils Absolute: 0 10*3/uL (ref 0.0–0.1)
EOS ABS: 0.1 10*3/uL (ref 0.0–0.7)
Eosinophils Relative: 2 %
HEMATOCRIT: 41.1 % (ref 39.0–52.0)
Hemoglobin: 13 g/dL (ref 13.0–17.0)
Lymphocytes Relative: 31 %
Lymphs Abs: 1.9 10*3/uL (ref 0.7–4.0)
MCH: 29.3 pg (ref 26.0–34.0)
MCHC: 31.6 g/dL (ref 30.0–36.0)
MCV: 92.8 fL (ref 78.0–100.0)
MONO ABS: 0.4 10*3/uL (ref 0.1–1.0)
MONOS PCT: 6 %
NEUTROS ABS: 3.6 10*3/uL (ref 1.7–7.7)
Neutrophils Relative %: 61 %
Platelets: 236 10*3/uL (ref 150–400)
RBC: 4.43 MIL/uL (ref 4.22–5.81)
RDW: 13.7 % (ref 11.5–15.5)
WBC: 5.9 10*3/uL (ref 4.0–10.5)

## 2017-07-30 LAB — RAPID URINE DRUG SCREEN, HOSP PERFORMED
AMPHETAMINES: NOT DETECTED
Barbiturates: NOT DETECTED
Benzodiazepines: NOT DETECTED
COCAINE: NOT DETECTED
OPIATES: NOT DETECTED
TETRAHYDROCANNABINOL: POSITIVE — AB

## 2017-07-30 LAB — COMPREHENSIVE METABOLIC PANEL
ALT: 22 U/L (ref 17–63)
ANION GAP: 6 (ref 5–15)
AST: 18 U/L (ref 15–41)
Albumin: 3.4 g/dL — ABNORMAL LOW (ref 3.5–5.0)
Alkaline Phosphatase: 54 U/L (ref 38–126)
BUN: 7 mg/dL (ref 6–20)
CHLORIDE: 110 mmol/L (ref 101–111)
CO2: 25 mmol/L (ref 22–32)
Calcium: 8.2 mg/dL — ABNORMAL LOW (ref 8.9–10.3)
Creatinine, Ser: 0.77 mg/dL (ref 0.61–1.24)
GFR calc non Af Amer: >60 mL/min (ref >60)
Glucose, Bld: 98 mg/dL (ref 65–99)
POTASSIUM: 3.9 mmol/L (ref 3.5–5.1)
SODIUM: 141 mmol/L (ref 135–145)
Total Bilirubin: 0.4 mg/dL (ref 0.3–1.2)
Total Protein: 6 g/dL — ABNORMAL LOW (ref 6.5–8.1)

## 2017-07-30 LAB — ETHANOL: Alcohol, Ethyl (B): <10 mg/dL (ref <10)

## 2017-07-30 LAB — TSH: TSH: 3.01 u[IU]/mL (ref 0.350–4.500)

## 2017-07-30 MED ORDER — FAMOTIDINE 20 MG PO TABS
20.0000 mg | ORAL_TABLET | Freq: Two times a day (BID) | ORAL | Status: DC | PRN
  Administered 2017-07-30: 20 mg via ORAL
  Filled 2017-07-30: qty 1

## 2017-07-30 MED ORDER — GABAPENTIN 100 MG PO CAPS
100.0000 mg | ORAL_CAPSULE | Freq: Three times a day (TID) | ORAL | Status: DC
  Administered 2017-07-30 – 2017-08-03 (×14): 100 mg via ORAL
  Filled 2017-07-30 (×14): qty 1

## 2017-07-30 NOTE — ED Triage Notes (Signed)
Pt. Stated, I've had feet pain which goes to my legs and my back for a year.

## 2017-07-30 NOTE — ED Triage Notes (Signed)
Tina's note  Recommends AM TTS

## 2017-07-30 NOTE — ED Notes (Signed)
Pt lying on bed watching tv. 

## 2017-07-30 NOTE — ED Notes (Signed)
Pt watching TV in bed.

## 2017-07-30 NOTE — BH Assessment (Signed)
Tele Assessment Note   Patient Name: Logan Bailey MRN: [id number] Referring Physician: Jeraldine LootsHammond, NP Location of Patient: [facility] Location of Provider: Behavioral Health TTS Department  Logan Bailey is an 56 y.o. male. Pt was a poor historian. This worker had a difficult time understanding what the Pt's current needs are. Pt gave very vague responses. The Pt denies SI/HI and AVH but states "I have a lot going on," "I need help fixing things in my life." Pt reports a Hx of mental health. Pt [facility]. Pt states he remembers he was prescribed Risperdal "a long time ago."  This writer attempted to contact the Pt's sister but was unsuccessful.  Logan Fermoina, NP recommends am psych evaluation.   Diagnosis:  F33.1 MDD  Past Medical History:  Past Medical History:  Diagnosis Date  . Seasonal allergies     Past Surgical History:  Procedure Laterality Date  . HERNIA REPAIR  10/16/2012   incarcerated  . INGUINAL HERNIA REPAIR Left 10/16/2012   Procedure: HERNIA REPAIR INGUINAL ADULT;  Surgeon: Axel FillerArmando Ramirez, MD;  Location: [facility] OR;  Service: General;  Laterality: Left;  . NO PAST SURGERIES      Family History: No family history on file.  Social History:  reports that he has been smoking cigarettes.  He has been smoking about 1.00 pack per day. he has never used smokeless tobacco. He reports that he uses drugs. Drug: Marijuana. He reports that he does not drink alcohol.  Additional Social History:  Alcohol / Drug Use Pain Medications: please see mar Prescriptions: please see mar Over the Counter: please see mar Longest period of sobriety (when/how long): NA  CIWA: CIWA-Ar BP: 128/84 Pulse Rate: (!) 58 Nausea and Vomiting: no nausea and no vomiting Tactile Disturbances: none(chronic nru) Tremor: no tremor Auditory Disturbances: very mild harshness or ability to frighten Paroxysmal Sweats: no sweat visible Visual Disturbances: not present Anxiety: no  anxiety, at ease Headache, Fullness in Head: moderately severe Agitation: normal activity Orientation and Clouding of Sensorium: cannot do serial additions or is uncertain about date CIWA-Ar Total: 6 COWS:    PATIENT STRENGTHS: (choose at least two) Active sense of humor Communication skills  Allergies: No Known Allergies  Home Medications:  (Not in a hospital admission)  OB/GYN Status:  No LMP for male patient.  General Assessment Data [facility] Assessment: In system Is this a Tele or Face-to-Face Assessment?: Tele Assessment Is this an Initial Assessment or a Re-assessment for this encounter?: Initial Assessment Marital status: Single Maiden name: NA Is patient pregnant?: No Pregnancy Status: No Living Arrangements: Alone, Other relatives Can pt return to current living arrangement?: No Admission Status: Voluntary Is patient capable of signing voluntary admission?: Yes Referral Source: Self/Family/Friend Insurance type: SP     Crisis Care Plan Living Arrangements: Alone, Other relatives Legal Guardian: Other:(self) Name of Psychiatrist: NA Name of Therapist: NA  Education Status Is patient currently in school?: No Current Grade: 10 Highest grade of school patient has completed: NA Name of school: NA Contact person: NA  Risk to self with the past 6 months Suicidal Ideation: No Has patient been a risk to self within the past 6 months prior to admission? : No Suicidal Intent: No Has patient had any suicidal intent within the past 6 months prior to admission? : No Is patient at risk for suicide?: No Suicidal Plan?: No Has patient had any suicidal plan within the past 6 months prior to  admission? : No Access to Means: No What has been your use of drugs/alcohol within the last 12 months?: NA Previous Attempts/Gestures: No How many times?: 0 Other Self Harm Risks: NA Triggers for Past Attempts: None known Intentional Self Injurious  Behavior: None Family Suicide History: No Recent stressful life event(s): Other (Comment)(unknown) Persecutory voices/beliefs?: No Depression: No Depression Symptoms: (pt denies) Substance abuse history and/or treatment for substance abuse?: No Suicide prevention information given to non-admitted patients: Not applicable  Risk to Others within the past 6 months Homicidal Ideation: No Does patient have any lifetime risk of violence toward others beyond the six months prior to admission? : No Thoughts of Harm to Others: No Current Homicidal Intent: No Current Homicidal Plan: No Access to Homicidal Means: No Identified Victim: NA History of harm to others?: No Assessment of Violence: None Noted Violent Behavior Description: NA Does patient have access to weapons?: No Criminal Charges Pending?: No Does patient have a court date: No Is patient on probation?: No  Psychosis Hallucinations: Auditory Delusions: None noted  Mental Status Report Appearance/Hygiene: Unremarkable Eye Contact: Fair Motor Activity: Freedom of movement Speech: Logical/coherent Level of Consciousness: Alert Mood: Euthymic Affect: Appropriate to circumstance Anxiety Level: None Thought Processes: Coherent, Relevant Judgement: Unimpaired Orientation: Time, Place, Situation, Person Obsessive Compulsive Thoughts/Behaviors: None  Cognitive Functioning Concentration: Normal Memory: Recent Intact, Remote Intact IQ: Average Insight: Fair Impulse Control: Fair Appetite: Fair Weight Loss: 0 Weight Gain: 0 Sleep: No Change Total Hours of Sleep: 8 Vegetative Symptoms: None  ADLScreening [facility] Assessment Services) Patient's cognitive ability adequate to safely complete daily activities?: Yes Patient able to express need for assistance with ADLs?: Yes Independently performs ADLs?: Yes (appropriate for developmental age)  Prior Inpatient Therapy Prior Inpatient Therapy: Yes Prior Therapy Dates:  2009 Prior Therapy Facilty/Provider(s): unknown Reason for Treatment: NA  Prior Outpatient Therapy Prior Outpatient Therapy: Yes Prior Therapy Dates: unknown Prior Therapy Facilty/Provider(s): [facility] Reason for Treatment: NA Does patient have an ACCT team?: No Does patient have Intensive In-House Services?  : No Does patient have [facility] services? : No Does patient have [facility] services?: No  ADL Screening (condition at time of admission) Patient's cognitive ability adequate to safely complete daily activities?: Yes Is the patient deaf or have difficulty hearing?: No Does the patient have difficulty seeing, even when wearing glasses/contacts?: No Does the patient have difficulty concentrating, remembering, or making decisions?: No Patient able to express need for assistance with ADLs?: Yes Does the patient have difficulty dressing or bathing?: No Independently performs ADLs?: Yes (appropriate for developmental age) Does the patient have difficulty walking or climbing stairs?: No Weakness of Legs: None Weakness of Arms/Hands: None       Abuse/Neglect Assessment (Assessment to be complete while patient is alone) Abuse/Neglect Assessment Can Be Completed: Yes Physical Abuse: Denies Verbal Abuse: Denies Sexual Abuse: Denies Exploitation of patient/patient's resources: Denies Self-Neglect: Denies     Merchant navy officer (For Healthcare) Does Patient Have a Medical Advance Directive?: No Would patient like information on creating a medical advance directive?: No - Patient declined    Additional Information 1:1 In Past 12 Months?: No CIRT Risk: No Elopement Risk: No Does patient have medical clearance?: Yes     Disposition:  Disposition Initial Assessment Completed for this Encounter: Yes Disposition of Patient: Re-evaluation by Psychiatry recommended  This service was provided via telemedicine using a 2-way, interactive audio and video technology.  Names of all  persons participating in this telemedicine service and their role in this encounter.  Name: Cherre Robinsina O. Role: NP  Name:  Role:   Name:  Role:  Name:  Role:     Emmit PomfretLevette,Suzi Hernan D 07/30/2017 1:02 PM

## 2017-07-30 NOTE — ED Provider Notes (Signed)
[facility] Provider Note   CSN: [id number] Arrival date & time: 07/30/17  0754     History   Chief Complaint Chief Complaint  Patient presents with  . Hallucinations    auditory  . Foot Pain  . Back Pain  . Leg Pain    HPI Logan Bailey is a 56 y.o. male who presents today for evaluation of multiple complaints. 1.  Back pain, bilateral leg pain, and bilateral foot pain: He reports that the symptoms have been present for over one year.  He reports pain in his lower back that is on both sides, feels cramping, and is unchanged from his normal.  He also reports that he has decreased sensation in his bilateral lower legs and indicates the anterior/lateral aspect.  He denies any numbness or tingling to his upper inner thighs or genitals.  His pain radiates through both extremities and into his toes.  He has not taken anything for his pains.  His pain is slightly worse, however is overall unchanged from his normal.No changes to bowel or bladder function.   2. Depression: Patient's sister reportedly told the tech that the patient tried to go to a mental health facility this morning for treatment and was told he needed to come here first for medical clearance.  Patient sister is not in the room at time of interview.  Patient reports that he did attempt to get mental health care this morning.  He reports that because of the situation he is and he feels hopeless, worthless, and no longer enjoys doing activities that he used to.  He denies any suicidal ideation or plan.  When asked about homicidal ideation or plan he says that if someone does something to him then he will want to hurt them back however he does not have any specific person or plan in mind.  He denies visual hallucinations, does endorse auditory hallucinations.  Ports that he has been hospitalized before for psychiatric concerns back in 2009.  He is not taking any medicines.  HPI  Past Medical  History:  Diagnosis Date  . Seasonal allergies     There are no active problems to display for this patient.   Past Surgical History:  Procedure Laterality Date  . HERNIA REPAIR  10/16/2012   incarcerated  . INGUINAL HERNIA REPAIR Left 10/16/2012   Procedure: HERNIA REPAIR INGUINAL ADULT;  Surgeon: Axel FillerArmando Ramirez, MD;  Location: [facility] OR;  Service: General;  Laterality: Left;  . NO PAST SURGERIES         Home Medications    Prior to Admission medications   Medication Sig Start Date End Date Taking? Authorizing Provider  aspirin 325 MG tablet Take 325 mg by mouth every 6 (six) hours as needed for mild pain.   Yes [provider]  diphenhydrAMINE (BENADRYL) 25 MG tablet Take 25 mg by mouth every 6 (six) hours as needed for itching or allergies.   Yes [provider]  ranitidine (ZANTAC) 150 MG capsule Take 1 capsule (150 mg total) by mouth 2 (two) times daily. 04/11/15  Yes Wofford, Jonny RuizJohn, MD  vitamin B-12 (CYANOCOBALAMIN) 100 MCG tablet Take 100 mcg by mouth daily.   Yes [provider]    Family History No family history on file.  Social History Social History   Tobacco Use  . Smoking status: Current Every Day Smoker    Packs/day: 1.00    Types: Cigarettes  . Smokeless tobacco: Never Used  Substance  Use Topics  . Alcohol use: No  . Drug use: Yes    Types: Marijuana     Allergies   Patient has no known allergies.   Review of Systems Review of Systems  Constitutional: Negative for chills and fever.  HENT: Negative for ear pain and sore throat.   Eyes: Negative for pain and visual disturbance.  Respiratory: Negative for cough and shortness of breath.   Cardiovascular: Negative for chest pain and palpitations.  Gastrointestinal: Negative for abdominal pain and vomiting.  Genitourinary: Negative for dysuria and hematuria.  Musculoskeletal: Positive for back pain. Negative for arthralgias, neck pain and neck stiffness.  Skin: Negative for  color change and rash.  Neurological: Positive for weakness and numbness. Negative for seizures and syncope.       Weakness, tingling in bilateral lower extremities.   Psychiatric/Behavioral: Positive for behavioral problems and dysphoric mood.  All other systems reviewed and are negative.    Physical Exam Updated Vital Signs BP 127/88 (BP Location: Right Arm)   Pulse 60   Temp 98 F (36.7 C) (Oral)   Resp 16   Ht  (1.88 m)   Wt 70.3 kg (155 lb)   SpO2 100%   BMI 19.90 kg/m   Physical Exam  Constitutional: He is oriented to person, place, and time. He appears well-developed and well-nourished.  HENT:  Head: Normocephalic and atraumatic.  Mouth/Throat: Oropharynx is clear and moist.  Eyes: Conjunctivae are normal.  Neck: Neck supple.  Cardiovascular: Normal rate, regular rhythm, normal heart sounds and intact distal pulses.  No murmur heard. 2+ radial, DP, PT pulses bilaterally.   Pulmonary/Chest: Effort normal and breath sounds normal. No respiratory distress.  Abdominal: Soft. There is no tenderness.  Musculoskeletal: He exhibits no edema or deformity.  5/5 strength to bilateral lower extremities through flexion/extension at ankle and knees.   TTP over bilateral lumbar paraspinal muscles, palpation here both re-creates and exacerbates his pain.  No midline tenderness.   Neurological: He is alert and oriented to person, place, and time.  Decreased sensation across bilateral feet, lower legs  Skin: Skin is warm and dry.  There is no obvious brusing, rashes, marks or wounds on bilateral lower legs.  There is thick skin and possible plantar warts present on sole of left foot.   Psychiatric: His affect is blunt. His speech is delayed. He is slowed and withdrawn. He exhibits a depressed mood. He expresses no homicidal and no suicidal ideation. He expresses no suicidal plans and no homicidal plans.  Nursing note and vitals reviewed.    ED Treatments / Results  Labs (all  labs ordered are listed, but only abnormal results are displayed) Labs Reviewed  COMPREHENSIVE METABOLIC PANEL - Abnormal; Notable for the following components:      Result Value   Calcium 8.2 (*)    Total Protein 6.0 (*)    Albumin 3.4 (*)    All other components within normal limits  RAPID URINE DRUG SCREEN, HOSP PERFORMED - Abnormal; Notable for the following components:   Tetrahydrocannabinol POSITIVE (*)    All other components within normal limits  ETHANOL  CBC WITH DIFFERENTIAL/PLATELET  TSH    EKG  EKG Interpretation None       Radiology No results found.  Procedures Procedures (including critical care time)  Medications Ordered in ED Medications  famotidine (PEPCID) tablet 20 mg (20 mg Oral Given 07/30/17 1325)  gabapentin (NEURONTIN) capsule 100 mg (100 mg Oral Given 07/30/17 1733)  Initial Impression / Assessment and Plan / ED Course  I have reviewed the triage vital signs and the nursing notes.  Pertinent labs & imaging results that were available during my care of the patient were reviewed by me and considered in my medical decision making (see chart for details).  Clinical Course as of Jul 30 1828  Sat Jul 30, 2017  0914 Sister told ED tech he wishes to be psych evaluated also.   [EH]  1309 TTS called, they are planning to reassess tomorrow.  He will remain voluntary.    [EH]  1314 Home meds re-ordered. Patient has not been compliant with his home meds and not taking the gabapentin secondary to noncompliance.  I have reordered this at a lower dose to start.  Previous dose was 300 mg morning, noon, and 600 mg at night.  Have ordered 100 mg 3 times a day.  [EH]  1451 Medically clear  [EH]    Clinical Course User Index [EH] Cristina Gong, PA-C    Patient presents to the emergency room for evaluation of chronic lumbar radiculopathy pain with medication noncompliance, and depression.  His lumbar radiculopathy is chronic and unchanged.  He does  not have any red flag signs including no changes to bowel/bladder function, no numbness/tingling of upper inner thighs or genitals.  2+ DP/PT pulses bilaterally.  Given chronicity, unchanged in nature I am not concerned for an acute process causing his symptoms.  Restarted him on his home gabapentin but he had not been taking for this. Psychiatrically he presents to the emergency room for depression.  He is not currently suicidal and denies a plan.  He denies homicidal ideations.  He does report occasional auditory hallucinations, however is a poor historian and is unable to elaborate.  His demeanor was flat, withdrawn, and odd affect.  He was brought to the emergency room by his sister.  He is here voluntarily.  Patient medically cleared and placed in psych hold.   Final Clinical Impressions(s) / ED Diagnoses   Final diagnoses:  None    ED Discharge Orders    None       Norman Clay 07/30/17 1833    Jacalyn Lefevre, MD 07/31/17 [phone]

## 2017-07-30 NOTE — ED Triage Notes (Signed)
Pt reported to PA  [facility] and was sent to ED to be checked in for treatment.

## 2017-07-30 NOTE — ED Notes (Signed)
Cane at bedside.

## 2017-07-31 DIAGNOSIS — F17210 Nicotine dependence, cigarettes, uncomplicated: Secondary | ICD-10-CM

## 2017-07-31 DIAGNOSIS — R443 Hallucinations, unspecified: Secondary | ICD-10-CM

## 2017-07-31 DIAGNOSIS — F1910 Other psychoactive substance abuse, uncomplicated: Secondary | ICD-10-CM

## 2017-07-31 DIAGNOSIS — F332 Major depressive disorder, recurrent severe without psychotic features: Secondary | ICD-10-CM

## 2017-07-31 DIAGNOSIS — F419 Anxiety disorder, unspecified: Secondary | ICD-10-CM

## 2017-07-31 DIAGNOSIS — R450 Nervousness: Secondary | ICD-10-CM

## 2017-07-31 DIAGNOSIS — F1210 Cannabis abuse, uncomplicated: Secondary | ICD-10-CM

## 2017-07-31 NOTE — ED Notes (Signed)
Pt up to restroom. Steady gait. Cooperative. No complaints.

## 2017-07-31 NOTE — ED Notes (Signed)
Telepsych completed.  

## 2017-07-31 NOTE — Consult Note (Signed)
Telepsych Consultation   Reason for Consult: Depression Referring Physician: Phylliss Bob, NP Location of Patient: [facility] ED Location of Provider: [facility]  Patient Identification: Logan Bailey MRN:  [id number] Principal Diagnosis: <principal problem not specified> Diagnosis:  There are no active problems to display for this patient.   Total Time spent with patient: 30 minutes  Subjective:   Logan Bailey is a 56 y.o. male patient admitted with MDD.  HPI: Per the TTS assessment completed on 07/30/17 by Logan Bailey: Logan Bailey is an 56 y.o. male. Pt was a poor historian. This worker had a difficult time understanding what the Pt's current needs are. Pt gave very vague responses. The Pt denies SI/HI and AVH but states "I have a lot going on," "I need help fixing things in my life." Pt reports a Hx of mental health. Pt states he was seen at [facility] previously. Pt states he remembers he was prescribed Risperdal "a long time ago."    On Exam: Patient was seen via tele-psych, chart reviewed with treatment team. Patient in bed, awake, alert and oriented x4. Patient reiterated the reason for this hospital admission as documented above. Patient stated, "I came to the hospital to get medical clearance". Patient reported going to [facility] to get his "mental health" medications renewed but that when he walked in with a cane, they required that he gets medical clearance. Patient is reporting that he believes that he needs to be on medications. Patient was not very clear as to what mental health medications he needs and his thoughts were disorganized. Patient denies any suicide or homicide ideations but endorsing both visual and auditory hallucinations.  Past Psychiatric History: As in H&P  Risk to Self: Suicidal Ideation: No Suicidal Intent: No Is patient at risk for suicide?: No Suicidal Plan?: No Access to Means: No What has been your use of drugs/alcohol within the last 12  months?: NA How many times?: 0 Other Self Harm Risks: NA Triggers for Past Attempts: None known Intentional Self Injurious Behavior: None Risk to Others: Homicidal Ideation: No Thoughts of Harm to Others: No Current Homicidal Intent: No Current Homicidal Plan: No Access to Homicidal Means: No Identified Victim: NA History of harm to others?: No Assessment of Violence: None Noted Violent Behavior Description: NA Does patient have access to weapons?: No Criminal Charges Pending?: No Does patient have a court date: No Prior Inpatient Therapy: Prior Inpatient Therapy: Yes Prior Therapy Dates: 2009 Prior Therapy Facilty/Provider(s): unknown Reason for Treatment: NA Prior Outpatient Therapy: Prior Outpatient Therapy: Yes Prior Therapy Dates: unknown Prior Therapy Facilty/Provider(s): [facility] Reason for Treatment: NA Does patient have an ACCT team?: No Does patient have Intensive In-House Services?  : No Does patient have [facility] services? : No Does patient have P4CC services?: No  Past Medical History:  Past Medical History:  Diagnosis Date  . Seasonal allergies     Past Surgical History:  Procedure Laterality Date  . HERNIA REPAIR  10/16/2012   incarcerated  . INGUINAL HERNIA REPAIR Left 10/16/2012   Procedure: HERNIA REPAIR INGUINAL ADULT;  Surgeon: Ralene Ok, MD;  Location: [facility];  Service: General;  Laterality: Left;  . NO PAST SURGERIES     Family History: No family history on file. Family Psychiatric  History: Unknown Social History:  Social History   Substance and Sexual Activity  Alcohol Use No     Social History   Substance and Sexual Activity  Drug Use Yes  . Types: Marijuana  Social History   Socioeconomic History  . Marital status: Divorced    Spouse name: None  . Number of children: None  . Years of education: None  . Highest education level: None  Social Needs  . Financial resource strain: None  . Food insecurity - worry: None  . Food  insecurity - inability: None  . Transportation needs - medical: None  . Transportation needs - non-medical: None  Occupational History  . None  Tobacco Use  . Smoking status: Current Every Day Smoker    Packs/day: 1.00    Types: Cigarettes  . Smokeless tobacco: Never Used  Substance and Sexual Activity  . Alcohol use: No  . Drug use: Yes    Types: Marijuana  . Sexual activity: None  Other Topics Concern  . None  Social History Narrative  . None   Additional Social History:    Allergies:  No Known Allergies  Labs:  Results for orders placed or performed during the hospital encounter of 07/30/17 (from the past 48 hour(s))  Comprehensive metabolic panel     Status: Abnormal   Collection Time: 07/30/17 11:38 AM  Result Value Ref Range   Sodium 141 135 - 145 mmol/L   Potassium 3.9 3.5 - 5.1 mmol/L   Chloride 110 101 - 111 mmol/L   CO2 25 22 - 32 mmol/L   Glucose, Bld 98 65 - 99 mg/dL   BUN 7 6 - 20 mg/dL   Creatinine, Ser 0.77 0.61 - 1.24 mg/dL   Calcium 8.2 (L) 8.9 - 10.3 mg/dL   Total Protein 6.0 (L) 6.5 - 8.1 g/dL   Albumin 3.4 (L) 3.5 - 5.0 g/dL   AST 18 15 - 41 U/L   ALT 22 17 - 63 U/L   Alkaline Phosphatase 54 38 - 126 U/L   Total Bilirubin 0.4 0.3 - 1.2 mg/dL   GFR calc non Af Amer >60 >60 mL/min   GFR calc Af Amer >60 >60 mL/min    Comment: (NOTE) The eGFR has been calculated using the CKD EPI equation. This calculation has not been validated in all clinical situations. eGFR's persistently <60 mL/min signify possible Chronic Kidney Disease.    Anion gap 6 5 - 15  Ethanol     Status: None   Collection Time: 07/30/17 11:38 AM  Result Value Ref Range   Alcohol, Ethyl (B) <10 <10 mg/dL    Comment:        LOWEST DETECTABLE LIMIT FOR SERUM ALCOHOL IS 10 mg/dL FOR MEDICAL PURPOSES ONLY   CBC with Diff     Status: None   Collection Time: 07/30/17 11:38 AM  Result Value Ref Range   WBC 5.9 4.0 - 10.5 K/uL   RBC 4.43 4.22 - 5.81 MIL/uL   Hemoglobin 13.0  13.0 - 17.0 g/dL   HCT 41.1 39.0 - 52.0 %   MCV 92.8 78.0 - 100.0 fL   MCH 29.3 26.0 - 34.0 pg   MCHC 31.6 30.0 - 36.0 g/dL   RDW 13.7 11.5 - 15.5 %   Platelets 236 150 - 400 K/uL   Neutrophils Relative % 61 %   Neutro Abs 3.6 1.7 - 7.7 K/uL   Lymphocytes Relative 31 %   Lymphs Abs 1.9 0.7 - 4.0 K/uL   Monocytes Relative 6 %   Monocytes Absolute 0.4 0.1 - 1.0 K/uL   Eosinophils Relative 2 %   Eosinophils Absolute 0.1 0.0 - 0.7 K/uL   Basophils Relative 0 %   Basophils  Absolute 0.0 0.0 - 0.1 K/uL  TSH     Status: None   Collection Time: 07/30/17 11:38 AM  Result Value Ref Range   TSH 3.010 0.350 - 4.500 uIU/mL    Comment: Performed by a 3rd Generation assay with a functional sensitivity of <=0.01 uIU/mL.  Urine rapid drug screen (hosp performed)     Status: Abnormal   Collection Time: 07/30/17  1:27 PM  Result Value Ref Range   Opiates NONE DETECTED NONE DETECTED   Cocaine NONE DETECTED NONE DETECTED   Benzodiazepines NONE DETECTED NONE DETECTED   Amphetamines NONE DETECTED NONE DETECTED   Tetrahydrocannabinol POSITIVE (A) NONE DETECTED   Barbiturates NONE DETECTED NONE DETECTED    Comment: (NOTE) DRUG SCREEN FOR MEDICAL PURPOSES ONLY.  IF CONFIRMATION IS NEEDED FOR ANY PURPOSE, NOTIFY LAB WITHIN 5 DAYS. LOWEST DETECTABLE LIMITS FOR URINE DRUG SCREEN Drug Class                     Cutoff (ng/mL) Amphetamine and metabolites    1000 Barbiturate and metabolites    200 Benzodiazepine                 809 Tricyclics and metabolites     300 Opiates and metabolites        300 Cocaine and metabolites        300 THC                            50     Medications:  Current Facility-Administered Medications  Medication Dose Route Frequency Provider Last Rate Last Dose  . famotidine (PEPCID) tablet 20 mg  20 mg Oral BID PRN Lorin Glass, PA-C   20 mg at 07/30/17 1325  . gabapentin (NEURONTIN) capsule 100 mg  100 mg Oral TID Lorin Glass, PA-C   100 mg at 07/31/17  9833   Current Outpatient Medications  Medication Sig Dispense Refill  . aspirin 325 MG tablet Take 325 mg by mouth every 6 (six) hours as needed for mild pain.    . diphenhydrAMINE (BENADRYL) 25 MG tablet Take 25 mg by mouth every 6 (six) hours as needed for itching or allergies.    . ranitidine (ZANTAC) 150 MG capsule Take 1 capsule (150 mg total) by mouth 2 (two) times daily. 6 capsule 0  . vitamin B-12 (CYANOCOBALAMIN) 100 MCG tablet Take 100 mcg by mouth daily.      Musculoskeletal: UTA via camera  Psychiatric Specialty Exam: Physical Exam  Nursing note and vitals reviewed.   Review of Systems  Psychiatric/Behavioral: Positive for depression, hallucinations and substance abuse. Negative for suicidal ideas. The patient is nervous/anxious. The patient does not have insomnia.   All other systems reviewed and are negative.   Blood pressure 129/80, pulse (!) 56, temperature 98 F (36.7 C), temperature source Oral, resp. rate 18, height _0  (1.88 m), weight 70.3 kg (155 lb), SpO2 100 %.Body mass index is 19.9 kg/m.  General Appearance: on hospital scrub  Eye Contact:  Good  Speech:  Clear and Coherent and Normal Rate  Volume:  Normal  Mood:  Anxious and Depressed  Affect:  Congruent  Thought Process:  Disorganized  Orientation:  Full (Time, Place, and Person)  Thought Content:  Illogical and Rumination  Suicidal Thoughts:  No  Homicidal Thoughts:  No  Memory:  Immediate;   Good Recent;   Good Remote;   Fair  Judgement:  Poor  Insight:  Shallow  Psychomotor Activity:  Normal  Concentration:  Concentration: Good and Attention Span: Good  Recall:  Good  Fund of Knowledge:  Good  Language:  Good  Akathisia:  Negative  Handed:  Right  AIMS (if indicated):     Assets:  Communication Skills Desire for Improvement Financial Resources/Insurance Housing Physical Health Social Support  ADL's:  Intact  Cognition:  WNL  Sleep:        Treatment Plan Summary: Daily  contact with patient to assess and evaluate symptoms and progress in treatment, Medication management and Plan to admit inpatient  Disposition: Recommend psychiatric Inpatient admission when medically cleared.  This service was provided via telemedicine using a 2-way, interactive audio and video technology.  Names of all persons participating in this telemedicine service and their role in this encounter. Name: Dinesh Ulysse. Pes[place] Role: Patient  Name: Justina A. Okonkwo  Role: NP           Vicenta Aly, NP 07/31/2017 10:57 AM

## 2017-07-31 NOTE — ED Notes (Signed)
Pt ambulated to RR and back to room with cane

## 2017-08-01 NOTE — ED Notes (Signed)
Pt given graham crackers and PB for snack

## 2017-08-01 NOTE — Progress Notes (Addendum)
Patient continues to meet inpatient criteria per NP.  Patient has been referred to the following: [facility] No bed [place] No bed [facility] denied due to acuity. [facility]  LCSW will continue to follow up with referrals and disposition of needs  3:54 PM CSW called [facility] to check status.  Patient denied due to acuity.  Timmothy EulerJean T. Kaylyn LimSutter, MSW, LCSWA Disposition Clinical Social Work [phone] (cell) [phone] (office)   1:56 PM LCSW [facility] and current under review. Requested additional information to be sent which was completed.  Awaiting review and call back to see if they can accept patient for admission.  Deretha EmoryHannah Coble LCSW, MSW Clinical Social Work: Optician, dispensingystem Wide Float Coverage for :  [phone]

## 2017-08-01 NOTE — ED Notes (Signed)
Pt escorted to shower room by sitter and tech

## 2017-08-01 NOTE — ED Notes (Signed)
Appetite good.  

## 2017-08-01 NOTE — [facility] Counselor (Signed)
Re-assessment:   Per Inetta Fermoina, NP, patient recommended for inpatient. TTS to seek placement.

## 2017-08-01 NOTE — [facility] Counselor (Signed)
Re-assessment:   Patient report he feels good now because he had a confused mind. Report due to life situations and health situations he felt helplessness.   Patient was a poor historian when asked about past mental health diagnosis. Patient could not express his past diagnosis. Patient report he lives with his sister.   Patient denise SI, HI, AV and HV.   TTS Clinical research associatewriter called patient sister Jill AlexandersSally Bryant. Sister report due to past traumatic event over 20 year ago (patient killed his girlfriend/spent time in prison) patient has been having mental health issues of paranoia such as a person is out of harm him. Patient's sister expressed as long as patient is on his medication he is fine, then he stops taking his medication because he feels that he is fine. Sister report patient always thinks someone is out to get him. Report patient is paranoid to taking pills due to thinking the pills is going to do something to him.  Patient sister with feels comfortable with patient coming home.

## 2017-08-01 NOTE — ED Notes (Signed)
Pt ambulatory to restroom

## 2017-08-02 NOTE — ED Notes (Signed)
Visitor at bedside.

## 2017-08-02 NOTE — ED Notes (Signed)
Breakfast tray delivered

## 2017-08-02 NOTE — ED Provider Notes (Addendum)
Resting in bed comfortably.  Denies complaint   Doug SouJacubowitz, Brittanie Dosanjh, MD 08/02/17 [phone] Alert ambulatory, no distress, stable    Doug SouJacubowitz, Antoni Stefan, MD 08/03/17 [phone]

## 2017-08-02 NOTE — ED Notes (Signed)
Pt ambulatory to restroom

## 2017-08-02 NOTE — ED Notes (Addendum)
Regular Diet Ordered for Dinner. The Pt.Logan Bailey Was given Sprite and [place] CloudGraham Crackers, w/ Peanut Butter for Snack.

## 2017-08-02 NOTE — ED Notes (Signed)
Pt escorted to shower with toiletries.  Bed linens changed.

## 2017-08-02 NOTE — ED Notes (Signed)
Pt given decaf coffee. 

## 2017-08-02 NOTE — BH Assessment (Signed)
Reassessment: Pt states that he feels better, but he continues to have disorganized thinking and has difficulty explaining, "I was under a whole lot of pressure", talking about insurance issues, pt has some tangential thoughts about "human nature" and denies having thoughts of harming others, "I am always going to feel depressed because I can't really do anything...there is always that process of trying to un-do something that was already done..things that happened recently..you have to let go of the past, but people want to still bring it up". Pt denies SI, HI, AVH, but admits to wanting to get on medication he says costs too much $1, 000). TTS continuing to seek placement.

## 2017-08-03 ENCOUNTER — Encounter (HOSPITAL_COMMUNITY): Payer: Self-pay | Admitting: Registered Nurse

## 2017-08-03 MED ORDER — ONDANSETRON HCL 4 MG PO TABS: 4.0000 mg | ORAL_TABLET | Freq: Three times a day (TID) | ORAL | Status: DC | PRN

## 2017-08-03 MED ORDER — ZOLPIDEM TARTRATE 5 MG PO TABS: 5.0000 mg | ORAL_TABLET | Freq: Every evening | ORAL | Status: DC | PRN

## 2017-08-03 MED ORDER — IBUPROFEN 400 MG PO TABS: 600.0000 mg | ORAL_TABLET | Freq: Four times a day (QID) | ORAL | Status: DC | PRN

## 2017-08-03 MED ORDER — ACETAMINOPHEN 325 MG PO TABS: 650.0000 mg | ORAL_TABLET | ORAL | Status: DC | PRN

## 2017-08-03 MED ORDER — ALUM & MAG HYDROXIDE-SIMETH 200-200-20 MG/5ML PO SUSP: 30.0000 mL | Freq: Four times a day (QID) | ORAL | Status: DC | PRN

## 2017-08-03 NOTE — ED Notes (Signed)
Feet assesed with ni noted open wounds, no swelling redness or drainage. Bilateral positive DP pulses.

## 2017-08-03 NOTE — ED Notes (Addendum)
Edp at bedside °

## 2017-08-03 NOTE — Consult Note (Signed)
  Logan Bailey, 56 y.o., male patient [facility] to [facility] with feet pain.  Patient seen via telepsych by this provider; chart reviewed and consulted with Dr. Lucianne MussKumar on 08/03/17.  On evaluation Logan Bailey reports "I went to [facility] and they told me I needed to come here for my feet pain."  Patient states that he has pain "from my feet all the way to my back."  Patient reports that he told them that his pain can make him feel like his mind is bad sometimes.  "They think everybody that come here is mental.  I didn't come for mental.  I came cause my bad feet hurt.  I came to get help for my feet."  Patient denies suicidal/homicidal/self-harm ideation; when asked if he wanted to kill or hurt himself patient responded "heck nawl." When asked if he was hearing or seeing things that others didn't patient stated "No, I have before though" ; patient also denies paranoia   During evaluation Logan Bailey is alert/oriented x 4; calm/cooperative with pleasant affect.  He does not appear to be responding to internal/external stimuli.  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  Patient denies suicidal/self-harm/homicidal ideation, psychosis, and paranoia.  Patient answered question appropriately.  Patient psychiatrically cleared.    Recommendation:  Patient to follow up with current psychiatric outpatient provider.  Referral ACTT services  Disposition:   No evidence of imminent risk to self or others at present.   Patient does not meet criteria for psychiatric inpatient admission  Shuvon B. Rankin, NP.

## 2017-08-03 NOTE — ED Notes (Signed)
Pt sitting up in bed awaiting  TTS on telepsych.

## 2017-08-03 NOTE — ED Notes (Signed)
edp made aware of pt wanting to leave. Pt made aware that edp will see him next.

## 2017-08-03 NOTE — ED Notes (Signed)
Pt oob to bathroom with steady gait. Minimal use of cane.

## 2017-08-03 NOTE — ED Notes (Signed)
Pt would like to leave at this time. Pt was explained that he has to talk to the doctor before the patient.

## 2017-08-03 NOTE — ED Provider Notes (Signed)
Pt seen and evaluated by the psychiatric team and felt to be stable for discharge from a mental health standpoint. Please see consultation note for complete details   Azalia Bilisampos, Nataniel Gasper, MD 08/03/17 [phone]

## 2017-08-03 NOTE — ED Notes (Signed)
EDP Jacubowitz evaluated the patient.

## 2017-08-16 ENCOUNTER — Encounter (INDEPENDENT_AMBULATORY_CARE_PROVIDER_SITE_OTHER): Payer: Self-pay | Admitting: Physician Assistant

## 2017-08-16 ENCOUNTER — Other Ambulatory Visit: Payer: Self-pay

## 2017-08-16 ENCOUNTER — Ambulatory Visit (INDEPENDENT_AMBULATORY_CARE_PROVIDER_SITE_OTHER): Payer: Self-pay | Admitting: Physician Assistant

## 2017-08-16 VITALS — BP 130/78 | HR 49 | Temp 97.7°F | Wt 159.8 lb

## 2017-08-16 DIAGNOSIS — G8929 Other chronic pain: Secondary | ICD-10-CM

## 2017-08-16 DIAGNOSIS — L84 Corns and callosities: Secondary | ICD-10-CM

## 2017-08-16 DIAGNOSIS — F418 Other specified anxiety disorders: Secondary | ICD-10-CM

## 2017-08-16 DIAGNOSIS — M5441 Lumbago with sciatica, right side: Secondary | ICD-10-CM

## 2017-08-16 DIAGNOSIS — M5442 Lumbago with sciatica, left side: Secondary | ICD-10-CM

## 2017-08-16 MED ORDER — PREDNISONE 20 MG PO TABS: 20.0000 mg | ORAL_TABLET | Freq: Every day | ORAL | 0 refills | Status: DC

## 2017-08-16 MED ORDER — ESCITALOPRAM OXALATE 20 MG PO TABS
20.0000 mg | ORAL_TABLET | Freq: Every day | ORAL | 2 refills | Status: DC
Start: 2017-08-16 — End: 2017-11-08

## 2017-08-16 MED ORDER — GABAPENTIN 300 MG PO CAPS: 300.0000 mg | ORAL_CAPSULE | Freq: Three times a day (TID) | ORAL | 3 refills | Status: DC

## 2017-08-16 NOTE — Patient Instructions (Signed)
Persistent Depressive Disorder Persistent depressive disorder (PDD) is a mental health condition. PDD causes symptoms of low-level depression for 2 years or longer. It may also be called long-term (chronic) depression or dysthymia. PDD may include episodes of more severe depression that last for about 2 weeks (major depressive disorder or MDD). PDD can affect the way you think, feel, and sleep. This condition may also affect your relationships. You may be more likely to get sick if you have PDD. Symptoms of PDD occur for most of the day and may include:  Feeling tired (fatigue).  Low energy.  Eating too much or too little.  Sleeping too much or too little.  Feeling restless or agitated.  Feeling hopeless.  Feeling worthless or guilty.  Feeling worried or nervous (anxiety).  Trouble concentrating or making decisions.  Low self-esteem.  A negative way of looking at things (outlook).  Not being able to have fun or feel pleasure.  Avoiding interacting with people.  Getting angry or annoyed easily (irritability).  Acting aggressive or angry.  Follow these instructions at home: Activity  Go back to your normal activities as told by your doctor.  Exercise regularly as told by your doctor. General instructions  Take over-the-counter and prescription medicines only as told by your doctor.  Do not drink alcohol. Or, limit how much alcohol you drink to no more than 1 drink a day for nonpregnant women and 2 drinks a day for men. One drink equals 12 oz of beer, 5 oz of wine, or 1 oz of hard liquor. Alcohol can affect any antidepressant medicines you are taking. Talk with your doctor about your alcohol use.  Eat a healthy diet and get plenty of sleep.  Find activities that you enjoy each day.  Consider joining a support group. Your doctor may be able to suggest a support group.  Keep all follow-up visits as told by your doctor. This is important. Where to find more  information: National Alliance on Mental Illness  www.nami.org  U.S. National Institute of Mental Health  www.nimh.nih.gov  National Suicide Prevention Lifeline  1-800-273-TALK (1-800-273-8255). This is free, 24-hour help.  Contact a doctor if:  Your symptoms get worse.  You have new symptoms.  You have trouble sleeping or doing your daily activities. Get help right away if:  You self-harm.  You have serious thoughts about hurting yourself or others.  You see, hear, taste, smell, or feel things that are not there (hallucinate). This information is not intended to replace advice given to you by your health care provider. Make sure you discuss any questions you have with your health care provider. Document Released: 07/07/2015 Document Revised: 03/19/2016 Document Reviewed: 03/19/2016 Elsevier Interactive Patient Education  2017 Elsevier Inc.  

## 2017-08-16 NOTE — Progress Notes (Signed)
Subjective:  Patient ID: Logan Bailey, male    DOB: [birth year]  Age: 57 y.o. MRN: [id number]  CC: hospital f/u  HPI Logan Bailey is a 57 y.o. male with a medical history of paresthesia, lumbar radiculopathy, calluses, and hernia repair presents on hospital f/u. Went to ED on 07/30/17 complaining of back pain, bilateral leg pain, and bilateral foot pain. Pt also reported depression. ED diagnosed with chronic pain syndrome and depression. Prescribed gabapentin which has been mildly effective. Still has back pain and paresthesia to a moderate degree with his current gabapentin dose. Pain is currently 5/10. Pain 10/10 at the worst.  XR of Lspine 10 months ago revealed mild degenerative changes.     Also complains of multiple painful calluses on the feet. Has not been to the podiatrist. He tries to pad the calluses as best as he could to relieve pain.    Patient describes depression since 4s after getting out of prison. PHQ9 score 13 and GAD 7 score 14. Feels the main driver of his depression is his overall situation of health, finances, and some losses in his life. Symptoms include irritability, fatigue, some anhedonia, insomnia, and hopelessness.       Outpatient Medications Prior to Visit  Medication Sig Dispense Refill  . diphenhydrAMINE (BENADRYL) 25 MG tablet Take 25 mg by mouth every 6 (six) hours as needed for itching or allergies.    . ranitidine (ZANTAC) 150 MG capsule Take 1 capsule (150 mg total) by mouth 2 (two) times daily. 6 capsule 0  . vitamin B-12 (CYANOCOBALAMIN) 100 MCG tablet Take 100 mcg by mouth daily.    Marland Kitchen aspirin 325 MG tablet Take 325 mg by mouth every 6 (six) hours as needed for mild pain.     No facility-administered medications prior to visit.      ROS Review of Systems  Constitutional: Negative for chills, fever and malaise/fatigue.  Eyes: Negative for blurred vision.  Respiratory: Negative for shortness of breath.   Cardiovascular: Negative for  chest pain and palpitations.  Gastrointestinal: Negative for abdominal pain and nausea.  Genitourinary: Negative for dysuria and hematuria.  Musculoskeletal: Positive for back pain. Negative for joint pain and myalgias.  Skin: Negative for rash.       Calluses on feet  Neurological: Negative for tingling and headaches.  Psychiatric/Behavioral: Positive for depression. The patient is nervous/anxious.     Objective:  BP 130/78 (BP Location: Left Arm, Patient Position: Sitting, Cuff Size: Normal)   Pulse (!) 49   Temp 97.7 F (36.5 C) (Oral)   Wt 159 lb 12.8 oz (72.5 kg)   SpO2 98%   BMI 20.52 kg/m   BP/Weight 08/16/2017 08/03/2017 07/30/2017  Systolic BP 130 122 -  Diastolic BP 78 77 -  Wt. (Lbs) 159.8 - 155  BMI 20.52 - 19.9      Physical Exam  Constitutional: He is oriented to person, place, and time.  Well developed, thin, NAD, polite  HENT:  Head: Normocephalic and atraumatic.  Eyes: No scleral icterus.  Neck: Normal range of motion. Neck supple. No thyromegaly present.  Cardiovascular: Normal rate, regular rhythm and normal heart sounds.  Pulmonary/Chest: Effort normal and breath sounds normal.  Musculoskeletal: He exhibits no edema.  Neurological: He is alert and oriented to person, place, and time. No cranial nerve deficit.  Normal gait considering use of cane for ambulation  Skin: Skin is warm and dry. No rash noted. No erythema. No pallor.  Multiple calluses on the  foot bilaterally  Psychiatric: He has a normal mood and affect. His behavior is normal. Thought content normal.  Vitals reviewed.    Assessment & Plan:    1. Chronic bilateral low back pain with bilateral sciatica - Begin predniSONE (DELTASONE) 20 MG tablet; Take 1 tablet (20 mg total) by mouth daily with breakfast.  Dispense: 7 tablet; Refill: 0 - Begin gabapentin (NEURONTIN) 300 MG capsule; Take 1 capsule (300 mg total) by mouth 3 (three) times daily.  Dispense: 90 capsule; Refill: 3  2. Callus  of foot - Ambulatory referral to Podiatry  3. Depression with anxiety - Begin escitalopram (LEXAPRO) 20 MG tablet; Take 1 tablet (20 mg total) by mouth daily.  Dispense: 30 tablet; Refill: 2   Meds ordered this encounter  Medications  . predniSONE (DELTASONE) 20 MG tablet    Sig: Take 1 tablet (20 mg total) by mouth daily with breakfast.    Dispense:  7 tablet    Refill:  0    Order Specific Question:   Supervising Provider    Answer:   Quentin AngstJEGEDE, OLUGBEMIGA E [id number]]  . gabapentin (NEURONTIN) 300 MG capsule    Sig: Take 1 capsule (300 mg total) by mouth 3 (three) times daily.    Dispense:  90 capsule    Refill:  3    Order Specific Question:   Supervising Provider    Answer:   Quentin AngstJEGEDE, OLUGBEMIGA E [id number]]  . escitalopram (LEXAPRO) 20 MG tablet    Sig: Take 1 tablet (20 mg total) by mouth daily.    Dispense:  30 tablet    Refill:  2    Order Specific Question:   Supervising Provider    Answer:   Quentin AngstJEGEDE, OLUGBEMIGA E [id number]]    Follow-up: Return in about 4 weeks (around 09/13/2017) for depression with anxiety. Back pain.   Loletta Specteroger David Lezette Kitts PA

## 2017-08-17 LAB — CBC WITH DIFFERENTIAL/PLATELET
BASOS ABS: 0 10*3/uL (ref 0.0–0.2)
BASOS: 0 %
EOS (ABSOLUTE): 0.1 10*3/uL (ref 0.0–0.4)
Eos: 2 %
HEMOGLOBIN: 14.2 g/dL (ref 13.0–17.7)
Hematocrit: 42.8 % (ref 37.5–51.0)
IMMATURE GRANS (ABS): 0 10*3/uL (ref 0.0–0.1)
Immature Granulocytes: 0 %
LYMPHS ABS: 2.4 10*3/uL (ref 0.7–3.1)
LYMPHS: 43 %
MCH: 29.5 pg (ref 26.6–33.0)
MCHC: 33.2 g/dL (ref 31.5–35.7)
MCV: 89 fL (ref 79–97)
MONOCYTES: 5 %
Monocytes Absolute: 0.3 10*3/uL (ref 0.1–0.9)
Neutrophils Absolute: 2.7 10*3/uL (ref 1.4–7.0)
Neutrophils: 50 %
Platelets: 312 10*3/uL (ref 150–379)
RBC: 4.82 x10E6/uL (ref 4.14–5.80)
RDW: 14.2 % (ref 12.3–15.4)
WBC: 5.5 10*3/uL (ref 3.4–10.8)

## 2017-08-17 LAB — COMPREHENSIVE METABOLIC PANEL
A/G RATIO: 1.7 (ref 1.2–2.2)
ALT: 13 IU/L (ref 0–44)
AST: 14 IU/L (ref 0–40)
Albumin: 4.2 g/dL (ref 3.5–5.5)
Alkaline Phosphatase: 59 IU/L (ref 39–117)
BILIRUBIN TOTAL: 0.2 mg/dL (ref 0.0–1.2)
BUN / CREAT RATIO: 10 (ref 9–20)
BUN: 9 mg/dL (ref 6–24)
CALCIUM: 9.5 mg/dL (ref 8.7–10.2)
CHLORIDE: 102 mmol/L (ref 96–106)
CO2: 24 mmol/L (ref 20–29)
Creatinine, Ser: 0.87 mg/dL (ref 0.76–1.27)
GFR, EST AFRICAN AMERICAN: 112 mL/min/{1.73_m2} (ref >59)
GFR, EST NON AFRICAN AMERICAN: 96 mL/min/{1.73_m2} (ref >59)
Globulin, Total: 2.5 g/dL (ref 1.5–4.5)
Glucose: 76 mg/dL (ref 65–99)
POTASSIUM: 4.1 mmol/L (ref 3.5–5.2)
Sodium: 144 mmol/L (ref 134–144)
TOTAL PROTEIN: 6.7 g/dL (ref 6.0–8.5)

## 2017-08-17 LAB — TSH: TSH: 2.9 u[IU]/mL (ref 0.450–4.500)

## 2017-08-18 ENCOUNTER — Encounter (INDEPENDENT_AMBULATORY_CARE_PROVIDER_SITE_OTHER): Payer: Self-pay

## 2017-08-18 ENCOUNTER — Telehealth (INDEPENDENT_AMBULATORY_CARE_PROVIDER_SITE_OTHER): Payer: Self-pay

## 2017-08-18 NOTE — Telephone Encounter (Signed)
Called every number on file for patient. One rings busy the other has a voicemail box that is not set up yet, called patients job and he is not working. Will mail results. Maryjean Mornempestt S Japheth Diekman, CMA

## 2017-08-18 NOTE — Telephone Encounter (Signed)
-----   Message from Loletta Specteroger David Gomez, PA-C sent at 08/17/2017  9:24 AM EST ----- Labs are normal. Please notify patient. Thank you.

## 2017-08-19 ENCOUNTER — Telehealth: Payer: Self-pay | Admitting: Licensed Clinical Social Worker

## 2017-08-19 NOTE — Telephone Encounter (Signed)
[facility] attempted to contact pt to follow up on behavioral health screens from prior appointment.   The numbers on file had a busy signal and/or had no voicemail set up. [facility] dialed pt's work number and was informed it is an incorrect number for pt's work.

## 2017-09-13 ENCOUNTER — Ambulatory Visit (INDEPENDENT_AMBULATORY_CARE_PROVIDER_SITE_OTHER): Payer: Self-pay | Admitting: Physician Assistant

## 2017-09-13 ENCOUNTER — Encounter (INDEPENDENT_AMBULATORY_CARE_PROVIDER_SITE_OTHER): Payer: Self-pay | Admitting: Physician Assistant

## 2017-09-13 VITALS — BP 115/68 | HR 58 | Temp 98.4°F | Resp 18 | Ht 74.0 in | Wt 163.0 lb

## 2017-09-13 DIAGNOSIS — M5441 Lumbago with sciatica, right side: Secondary | ICD-10-CM

## 2017-09-13 DIAGNOSIS — F418 Other specified anxiety disorders: Secondary | ICD-10-CM

## 2017-09-13 DIAGNOSIS — Z23 Encounter for immunization: Secondary | ICD-10-CM

## 2017-09-13 DIAGNOSIS — M5442 Lumbago with sciatica, left side: Secondary | ICD-10-CM

## 2017-09-13 DIAGNOSIS — G8929 Other chronic pain: Secondary | ICD-10-CM

## 2017-09-13 DIAGNOSIS — Z1211 Encounter for screening for malignant neoplasm of colon: Secondary | ICD-10-CM

## 2017-09-13 NOTE — Progress Notes (Signed)
Subjective:  Patient ID: Logan Bailey, male    DOB: [birth year]  Age: 57 y.o. MRN: [id number]  CC: back and anxiety  HPI Logan Bailey is a 57 y.o. male with a medical history of paresthesia, lumbar radiculopathy, calluses, and hernia repair presents on f/u of chronic bilateral LBP w/bilateral sciatica and depression with anxiety. Prescribed prednisone 20 mg and gabapentin 300 mg nearly one month ago. States the medications have helped drastically reduce his LBP with sciatica by 75%.      Pt prescribed Lexapro 20 mg nearly one month ago for anxiety and depression. His PHQ9 and GAD7 score was 13 and 7 respectively. PHQ9 and GAD7 scores are 5 and 4 respectively today in clinic. Says he is feeling much better and is more resilient to stress.       Outpatient Medications Prior to Visit  Medication Sig Dispense Refill  . aspirin 325 MG tablet Take 325 mg by mouth every 6 (six) hours as needed for mild pain.    . diphenhydrAMINE (BENADRYL) 25 MG tablet Take 25 mg by mouth every 6 (six) hours as needed for itching or allergies.    Marland Kitchen. escitalopram (LEXAPRO) 20 MG tablet Take 1 tablet (20 mg total) by mouth daily. 30 tablet 2  . gabapentin (NEURONTIN) 300 MG capsule Take 1 capsule (300 mg total) by mouth 3 (three) times daily. 90 capsule 3  . predniSONE (DELTASONE) 20 MG tablet Take 1 tablet (20 mg total) by mouth daily with breakfast. 7 tablet 0  . ranitidine (ZANTAC) 150 MG capsule Take 1 capsule (150 mg total) by mouth 2 (two) times daily. 6 capsule 0  . vitamin B-12 (CYANOCOBALAMIN) 100 MCG tablet Take 100 mcg by mouth daily.     No facility-administered medications prior to visit.      ROS Review of Systems  Constitutional: Negative for chills, fever and malaise/fatigue.  Eyes: Negative for blurred vision.  Respiratory: Negative for shortness of breath.   Cardiovascular: Negative for chest pain and palpitations.  Gastrointestinal: Negative for abdominal pain and nausea.   Genitourinary: Negative for dysuria and hematuria.  Musculoskeletal: Positive for back pain (mild ). Negative for joint pain and myalgias.  Skin: Negative for rash.  Neurological: Negative for tingling and headaches.  Psychiatric/Behavioral: Negative for depression. The patient is not nervous/anxious.     Objective:  Ht 6\' 2"  (1.88 m)   Wt 163 lb (73.9 kg)   BMI 20.93 kg/m   BP/Weight 09/13/2017 08/16/2017 08/03/2017  Systolic BP - 130 122  Diastolic BP - 78 77  Wt. (Lbs) 163 159.8 -  BMI 20.93 20.52 -      Physical Exam  Constitutional: He is oriented to person, place, and time.  Well developed, well nourished, NAD, polite  HENT:  Head: Normocephalic and atraumatic.  Eyes: No scleral icterus.  Neck: Normal range of motion. Neck supple. No thyromegaly present.  Pulmonary/Chest: Effort normal.  Musculoskeletal: He exhibits no edema.  Full aROM of back with mild pain elicited on right rotation. Mild to moderately increased muscular tonicity of the T and L spine paraspinals.   Neurological: He is alert and oriented to person, place, and time. No cranial nerve deficit. Coordination normal.  Skin: Skin is warm and dry. No rash noted. No erythema. No pallor.  Psychiatric: He has a normal mood and affect. His behavior is normal. Thought content normal.  Vitals reviewed.    Assessment & Plan:    1. Chronic bilateral low back pain with  bilateral sciatica - Continue Gabapentin 300 mg TID  2. Depression with anxiety - Continue Lexapro 20 mg qday  3. Need for prophylactic vaccination with combined diphtheria-tetanus-pertussis (DTP) vaccine - Tdap vaccine greater than or equal to 7yo IM  4. Need for prophylactic vaccination and inoculation against influenza - Flu Vaccine QUAD 6+ mos PF IM (Fluarix Quad PF)  5. Special screening for malignant neoplasms, colon - Fecal occult blood, imunochemical     Follow-up: Return in about 8 weeks (around 11/08/2017) for depression with  anxiety.   Loletta Specter PA

## 2017-09-13 NOTE — Patient Instructions (Signed)
Sciatica Sciatica is pain, numbness, weakness, or tingling along your sciatic nerve. The sciatic nerve starts in the lower back and goes down the back of each leg. Sciatica happens when this nerve is pinched or has pressure put on it. Sciatica usually goes away on its own or with treatment. Sometimes, sciatica may keep coming back (recur). Follow these instructions at home: Medicines  Take over-the-counter and prescription medicines only as told by your doctor.  Do not drive or use heavy machinery while taking prescription pain medicine. Managing pain  If directed, put ice on the affected area. ? Put ice in a plastic bag. ? Place a towel between your skin and the bag. ? Leave the ice on for 20 minutes, 2-3 times a day.  After icing, apply heat to the affected area before you exercise or as often as told by your doctor. Use the heat source that your doctor tells you to use, such as a moist heat pack or a heating pad. ? Place a towel between your skin and the heat source. ? Leave the heat on for 20-30 minutes. ? Remove the heat if your skin turns bright red. This is especially important if you are unable to feel pain, heat, or cold. You may have a greater risk of getting burned. Activity  Return to your normal activities as told by your doctor. Ask your doctor what activities are safe for you. ? Avoid activities that make your sciatica worse.  Take short rests during the day. Rest in a lying or standing position. This is usually better than sitting to rest. ? When you rest for a long time, do some physical activity or stretching between periods of rest. ? Avoid sitting for a long time without moving. Get up and move around at least one time each hour.  Exercise and stretch regularly, as told by your doctor.  Do not lift anything that is heavier than 10 lb (4.5 kg) while you have symptoms of sciatica. ? Avoid lifting heavy things even when you do not have symptoms. ? Avoid lifting heavy  things over and over.  When you lift objects, always lift in a way that is safe for your body. To do this, you should: ? Bend your knees. ? Keep the object close to your body. ? Avoid twisting. General instructions  Use good posture. ? Avoid leaning forward when you are sitting. ? Avoid hunching over when you are standing.  Stay at a healthy weight.  Wear comfortable shoes that support your feet. Avoid wearing high heels.  Avoid sleeping on a mattress that is too soft or too hard. You might have less pain if you sleep on a mattress that is firm enough to support your back.  Keep all follow-up visits as told by your doctor. This is important. Contact a doctor if:  You have pain that: ? Wakes you up when you are sleeping. ? Gets worse when you lie down. ? Is worse than the pain you have had in the past. ? Lasts longer than 4 weeks.  You lose weight for without trying. Get help right away if:  You cannot control when you pee (urinate) or poop (have a bowel movement).  You have weakness in any of these areas and it gets worse. ? Lower back. ? Lower belly (pelvis). ? Butt (buttocks). ? Legs.  You have redness or swelling of your back.  You have a burning feeling when you pee. This information is not intended to replace   advice given to you by your health care provider. Make sure you discuss any questions you have with your health care provider. Document Released: 05/04/2008 Document Revised: 01/01/2016 Document Reviewed: 04/04/2015 Elsevier Interactive Patient Education  2018 Elsevier Inc.  

## 2017-09-23 ENCOUNTER — Ambulatory Visit: Payer: Self-pay | Attending: Internal Medicine

## 2017-09-24 LAB — FECAL OCCULT BLOOD, IMMUNOCHEMICAL: Fecal Occult Bld: NEGATIVE

## 2017-09-29 ENCOUNTER — Telehealth (INDEPENDENT_AMBULATORY_CARE_PROVIDER_SITE_OTHER): Payer: Self-pay | Admitting: *Deleted

## 2017-09-29 NOTE — Telephone Encounter (Signed)
MA unable to reach patient due to phone ringing busy. !!!Please inform patient of FIT test being negative and bieng completed again in one year!!!

## 2017-09-29 NOTE — Telephone Encounter (Signed)
-----   Message from Loletta Specteroger David Gomez, PA-C sent at 09/26/2017  5:24 PM EST ----- FIT negative.

## 2017-09-30 MED FILL — GABAPENTIN 300 MG CAPSULE: 300 | 30 days supply | Qty: 90 | Fill #0

## 2017-10-14 MED FILL — ESCITALOPRAM 20 MG TABLET: 20 | 30 days supply | Qty: 30 | Fill #0

## 2017-11-03 MED FILL — GABAPENTIN 300 MG CAPSULE: 300 | 30 days supply | Qty: 90 | Fill #1

## 2017-11-08 ENCOUNTER — Encounter (INDEPENDENT_AMBULATORY_CARE_PROVIDER_SITE_OTHER): Payer: Self-pay | Admitting: Physician Assistant

## 2017-11-08 ENCOUNTER — Ambulatory Visit (INDEPENDENT_AMBULATORY_CARE_PROVIDER_SITE_OTHER): Payer: Self-pay | Admitting: Physician Assistant

## 2017-11-08 ENCOUNTER — Other Ambulatory Visit: Payer: Self-pay

## 2017-11-08 VITALS — BP 126/76 | HR 53 | Temp 97.4°F | Ht 74.0 in | Wt 166.8 lb

## 2017-11-08 DIAGNOSIS — M5441 Lumbago with sciatica, right side: Secondary | ICD-10-CM

## 2017-11-08 DIAGNOSIS — F418 Other specified anxiety disorders: Secondary | ICD-10-CM

## 2017-11-08 DIAGNOSIS — M5442 Lumbago with sciatica, left side: Secondary | ICD-10-CM

## 2017-11-08 DIAGNOSIS — G8929 Other chronic pain: Secondary | ICD-10-CM

## 2017-11-08 MED ORDER — GABAPENTIN 300 MG PO CAPS: 600.0000 mg | ORAL_CAPSULE | Freq: Three times a day (TID) | ORAL | 5 refills | Status: DC

## 2017-11-08 MED ORDER — ESCITALOPRAM OXALATE 20 MG PO TABS: 20.0000 mg | ORAL_TABLET | Freq: Every day | ORAL | 5 refills | Status: DC

## 2017-11-08 MED FILL — ESCITALOPRAM 20 MG TABLET: 20 | 30 days supply | Qty: 30 | Fill #0

## 2017-11-08 NOTE — Patient Instructions (Signed)
Escitalopram tablets What is this medicine? ESCITALOPRAM (es sye TAL oh pram) is used to treat depression and certain types of anxiety. This medicine may be used for other purposes; ask your health care provider or pharmacist if you have questions. COMMON BRAND NAME(S): Lexapro What should I tell my health care provider before I take this medicine? They need to know if you have any of these conditions: -bipolar disorder or a family history of bipolar disorder -diabetes -glaucoma -heart disease -kidney or liver disease -receiving electroconvulsive therapy -seizures (convulsions) -suicidal thoughts, plans, or attempt by you or a family member -an unusual or allergic reaction to escitalopram, the related drug citalopram, other medicines, foods, dyes, or preservatives -pregnant or trying to become pregnant -breast-feeding How should I use this medicine? Take this medicine by mouth with a glass of water. Follow the directions on the prescription label. You can take it with or without food. If it upsets your stomach, take it with food. Take your medicine at regular intervals. Do not take it more often than directed. Do not stop taking this medicine suddenly except upon the advice of your doctor. Stopping this medicine too quickly may cause serious side effects or your condition may worsen. A special MedGuide will be given to you by the pharmacist with each prescription and refill. Be sure to read this information carefully each time. Talk to your pediatrician regarding the use of this medicine in children. Special care may be needed. Overdosage: If you think you have taken too much of this medicine contact a poison control center or emergency room at once. NOTE: This medicine is only for you. Do not share this medicine with others. What if I miss a dose? If you miss a dose, take it as soon as you can. If it is almost time for your next dose, take only that dose. Do not take double or extra  doses. What may interact with this medicine? Do not take this medicine with any of the following medications: -certain medicines for fungal infections like fluconazole, itraconazole, ketoconazole, posaconazole, voriconazole -cisapride -citalopram -dofetilide -dronedarone -linezolid -MAOIs like Carbex, Eldepryl, Marplan, Nardil, and Parnate -methylene blue (injected into a vein) -pimozide -thioridazine -ziprasidone This medicine may also interact with the following medications: -alcohol -amphetamines -aspirin and aspirin-like medicines -carbamazepine -certain medicines for depression, anxiety, or psychotic disturbances -certain medicines for migraine headache like almotriptan, eletriptan, frovatriptan, naratriptan, rizatriptan, sumatriptan, zolmitriptan -certain medicines for sleep -certain medicines that treat or prevent blood clots like warfarin, enoxaparin, dalteparin -cimetidine -diuretics -fentanyl -furazolidone -isoniazid -lithium -metoprolol -NSAIDs, medicines for pain and inflammation, like ibuprofen or naproxen -other medicines that prolong the QT interval (cause an abnormal heart rhythm) -procarbazine -rasagiline -supplements like St. John's wort, kava kava, valerian -tramadol -tryptophan This list may not describe all possible interactions. Give your health care provider a list of all the medicines, herbs, non-prescription drugs, or dietary supplements you use. Also tell them if you smoke, drink alcohol, or use illegal drugs. Some items may interact with your medicine. What should I watch for while using this medicine? Tell your doctor if your symptoms do not get better or if they get worse. Visit your doctor or health care professional for regular checks on your progress. Because it may take several weeks to see the full effects of this medicine, it is important to continue your treatment as prescribed by your doctor. Patients and their families should watch out for  new or worsening thoughts of suicide or depression. Also watch out for   sudden changes in feelings such as feeling anxious, agitated, panicky, irritable, hostile, aggressive, impulsive, severely restless, overly excited and hyperactive, or not being able to sleep. If this happens, especially at the beginning of treatment or after a change in dose, call your health care professional. You may get drowsy or dizzy. Do not drive, use machinery, or do anything that needs mental alertness until you know how this medicine affects you. Do not stand or sit up quickly, especially if you are an older patient. This reduces the risk of dizzy or fainting spells. Alcohol may interfere with the effect of this medicine. Avoid alcoholic drinks. Your mouth may get dry. Chewing sugarless gum or sucking hard candy, and drinking plenty of water may help. Contact your doctor if the problem does not go away or is severe. What side effects may I notice from receiving this medicine? Side effects that you should report to your doctor or health care professional as soon as possible: -allergic reactions like skin rash, itching or hives, swelling of the face, lips, or tongue -anxious -black, tarry stools -changes in vision -confusion -elevated mood, decreased need for sleep, racing thoughts, impulsive behavior -eye pain -fast, irregular heartbeat -feeling faint or lightheaded, falls -feeling agitated, angry, or irritable -hallucination, loss of contact with reality -loss of balance or coordination -loss of memory -painful or prolonged erections -restlessness, pacing, inability to keep still -seizures -stiff muscles -suicidal thoughts or other mood changes -trouble sleeping -unusual bleeding or bruising -unusually weak or tired -vomiting Side effects that usually do not require medical attention (report to your doctor or health care professional if they continue or are bothersome): -changes in appetite -change in sex  drive or performance -headache -increased sweating -indigestion, nausea -tremors This list may not describe all possible side effects. Call your doctor for medical advice about side effects. You may report side effects to FDA at 1-800-FDA-1088. Where should I keep my medicine? Keep out of reach of children. Store at room temperature between 15 and 30 degrees C (59 and 86 degrees F). Throw away any unused medicine after the expiration date. NOTE: This sheet is a summary. It may not cover all possible information. If you have questions about this medicine, talk to your doctor, pharmacist, or health care provider.  2018 Elsevier/Gold Standard (2015-12-29 13:20:23)  

## 2017-11-08 NOTE — Progress Notes (Signed)
Subjective:  Patient ID: Logan Bailey, male    DOB: [birth year]  Age: 57 y.o. MRN: [id number]  CC: f/u anxiety and depression  HPI Logan Bailey a 57 y.o.malewith a medical history of paresthesia, lumbar radiculopathy, calluses, and hernia repair presents for f/u of anxiety and depression. PHQ9 and GAD7 scores are 5 and 4 respectively last month. PHQ9 5 and GAD7 2 in clinic today. Says he is feeling much better and is more resilient to stress. Taking Lexapro 20 mg as directed. Does not endorse suicidal ideation/intent.  Only other complaint is of mild and occasional bilateral lower extremity paresthesias attributed to his chronic lower back pain. Gabapentin has been helpful in relieving paresthesia and LBP. No saddle paresthesia, urinary incontinence, or fecal incontinence.     Outpatient Medications Prior to Visit  Medication Sig Dispense Refill  . escitalopram (LEXAPRO) 20 MG tablet Take 1 tablet (20 mg total) by mouth daily. 30 tablet 2  . gabapentin (NEURONTIN) 300 MG capsule Take 1 capsule (300 mg total) by mouth 3 (three) times daily. 90 capsule 3  . ranitidine (ZANTAC) 150 MG capsule Take 1 capsule (150 mg total) by mouth 2 (two) times daily. 6 capsule 0  . vitamin B-12 (CYANOCOBALAMIN) 100 MCG tablet Take 100 mcg by mouth daily.     . diphenhydrAMINE (BENADRYL) 25 MG tablet Take 25 mg by mouth every 6 (six) hours as needed for itching or allergies.    Marland Kitchen aspirin 325 MG tablet Take 325 mg by mouth every 6 (six) hours as needed for mild pain.     No facility-administered medications prior to visit.      ROS Review of Systems  Constitutional: Negative for chills, fever and malaise/fatigue.  Eyes: Negative for blurred vision.  Respiratory: Negative for shortness of breath.   Cardiovascular: Negative for chest pain and palpitations.  Gastrointestinal: Negative for abdominal pain and nausea.  Genitourinary: Negative for dysuria and hematuria.  Musculoskeletal: Negative for  joint pain and myalgias.  Skin: Negative for rash.  Neurological: Negative for tingling and headaches.  Psychiatric/Behavioral: Negative for depression. The patient is not nervous/anxious.     Objective:  BP 126/76 (BP Location: Left Arm, Patient Position: Sitting, Cuff Size: Normal)   Pulse (!) 53   Temp (!) 97.4 F (36.3 C) (Oral)   Ht 6\' 2"  (1.88 m)   Wt 166 lb 12.8 oz (75.7 kg)   SpO2 98%   BMI 21.42 kg/m   BP/Weight 11/08/2017 09/13/2017 08/16/2017  Systolic BP 126 115 130  Diastolic BP 76 68 78  Wt. (Lbs) 166.8 163 159.8  BMI 21.42 20.93 20.52      Physical Exam  Constitutional: He is oriented to person, place, and time.  Well developed, well nourished, NAD, polite  HENT:  Head: Normocephalic and atraumatic.  Eyes: No scleral icterus.  Neck: Normal range of motion. Neck supple. No thyromegaly present.  Cardiovascular: Normal rate, regular rhythm and normal heart sounds.  Pulmonary/Chest: Effort normal and breath sounds normal.  Musculoskeletal: He exhibits no edema.  Neurological: He is alert and oriented to person, place, and time. No cranial nerve deficit. Coordination normal.  Skin: Skin is warm and dry. No rash noted. No erythema. No pallor.  Psychiatric: He has a normal mood and affect. His behavior is normal. Thought content normal.  Vitals reviewed.    Assessment & Plan:    1. Depression with anxiety - Begin escitalopram (LEXAPRO) 20 MG tablet; Take 1 tablet (20 mg total) by mouth  daily.  Dispense: 30 tablet; Refill: 5  2. Chronic bilateral low back pain with bilateral sciatica - Increase gabapentin (NEURONTIN) 300 MG capsule; Take 2 capsules (600 mg total) by mouth 3 (three) times daily.  Dispense: 180 capsule; Refill: 5   Meds ordered this encounter  Medications  . gabapentin (NEURONTIN) 300 MG capsule    Sig: Take 2 capsules (600 mg total) by mouth 3 (three) times daily.    Dispense:  180 capsule    Refill:  5    Order Specific Question:    Supervising Provider    Answer:   Logan Bailey, Logan E [id number]]  . escitalopram (LEXAPRO) 20 MG tablet    Sig: Take 1 tablet (20 mg total) by mouth daily.    Dispense:  30 tablet    Refill:  5    Order Specific Question:   Supervising Provider    Answer:   Logan Bailey, Logan E [id number]    Follow-up: Return in about 4 months (around 03/10/2018) for f/u anxiety and paresthesia/LBP.   Logan Bailey

## 2017-11-23 MED FILL — GABAPENTIN 300 MG CAPSULE: 300 | 30 days supply | Qty: 180 | Fill #0

## 2017-12-15 MED FILL — ESCITALOPRAM 20 MG TABLET: 20 | 30 days supply | Qty: 30 | Fill #1

## 2017-12-21 ENCOUNTER — Encounter (HOSPITAL_COMMUNITY): Payer: Self-pay | Admitting: Emergency Medicine

## 2017-12-21 ENCOUNTER — Other Ambulatory Visit: Payer: Self-pay

## 2017-12-21 ENCOUNTER — Emergency Department (HOSPITAL_COMMUNITY)
Admission: EM | Admit: 2017-12-21 | Discharge: 2017-12-21 | Disposition: A | Discharge status: Home / Self Care | Payer: Self-pay | Attending: Emergency Medicine | Admitting: Emergency Medicine

## 2017-12-21 DIAGNOSIS — M5410 Radiculopathy, site unspecified: Secondary | ICD-10-CM | POA: Insufficient documentation

## 2017-12-21 DIAGNOSIS — F17210 Nicotine dependence, cigarettes, uncomplicated: Secondary | ICD-10-CM | POA: Insufficient documentation

## 2017-12-21 DIAGNOSIS — Z79899 Other long term (current) drug therapy: Secondary | ICD-10-CM | POA: Insufficient documentation

## 2017-12-21 NOTE — ED Provider Notes (Signed)
[facility]-EMERGENCY DEPT Provider Note   CSN: [id number] Arrival date & time: 12/21/17  0132     History   Chief Complaint Chief Complaint  Patient presents with  . Back Pain    HPI Logan Bailey is a 57 y.o. male.  Patient is a 57 year old male with history of prior hernia repair and what sounds like some sort of neuropathy.  He tells me he is followed by Dr. Aundria Rud from orthopedics for some sort of issue with his back.  He presents tonight with complaints of a worsening of his back pain that occurred this evening.  He states that his legs "locked up on him".  He states that he was having difficulty moving his legs and presents for evaluation of this.  This episode lasted for approximately 30 to 45 minutes, then resolved.  He is currently symptom-free.  He denies any bowel or bladder complaints.  The history is provided by the patient.  Back Pain   This is a recurrent problem. The current episode started 1 to 2 hours ago. The problem occurs constantly. The problem has been resolved. The pain is associated with no known injury. The pain is present in the lumbar spine. The quality of the pain is described as shooting. The pain radiates to the left thigh and right thigh. The pain is moderate.    Past Medical History:  Diagnosis Date  . Seasonal allergies     There are no active problems to display for this patient.   Past Surgical History:  Procedure Laterality Date  . HERNIA REPAIR  10/16/2012   incarcerated  . INGUINAL HERNIA REPAIR Left 10/16/2012   Procedure: HERNIA REPAIR INGUINAL ADULT;  Surgeon: Axel Filler, MD;  Location: [facility] OR;  Service: General;  Laterality: Left;  . NO PAST SURGERIES          Home Medications    Prior to Admission medications   Medication Sig Start Date End Date Taking? Authorizing Provider  diphenhydrAMINE (BENADRYL) 25 MG tablet Take 25 mg by mouth every 6 (six) hours as needed for itching or allergies.     [provider]  escitalopram (LEXAPRO) 20 MG tablet Take 1 tablet (20 mg total) by mouth daily. 11/08/17   Loletta Specter, PA-C  gabapentin (NEURONTIN) 300 MG capsule Take 2 capsules (600 mg total) by mouth 3 (three) times daily. 11/08/17   Loletta Specter, PA-C  ranitidine (ZANTAC) 150 MG capsule Take 1 capsule (150 mg total) by mouth 2 (two) times daily. 04/11/15   Blake Divine, MD  vitamin B-12 (CYANOCOBALAMIN) 100 MCG tablet Take 100 mcg by mouth daily.     [provider]    Family History History reviewed. No pertinent family history.  Social History Social History   Tobacco Use  . Smoking status: Current Every Day Smoker    Packs/day: 1.00    Types: Cigarettes  . Smokeless tobacco: Never Used  Substance Use Topics  . Alcohol use: No  . Drug use: Yes    Types: Marijuana     Allergies   Patient has no known allergies.   Review of Systems Review of Systems  Musculoskeletal: Positive for back pain.  All other systems reviewed and are negative.    Physical Exam Updated Vital Signs BP 120/83 (BP Location: Right Arm)   Pulse (!) 51   Temp 97.9 F (36.6 C) (Oral)   Resp 16   Ht  (1.88 m)   Wt 72.6 kg (  160 lb)   SpO2 100%   BMI 20.54 kg/m   Physical Exam  Constitutional: He is oriented to person, place, and time. He appears well-developed and well-nourished. No distress.  HENT:  Head: Normocephalic and atraumatic.  Mouth/Throat: Oropharynx is clear and moist.  Neck: Normal range of motion. Neck supple.  Cardiovascular: Normal rate and regular rhythm. Exam reveals no friction rub.  No murmur heard. Pulmonary/Chest: Effort normal and breath sounds normal. No respiratory distress. He has no wheezes. He has no rales.  Abdominal: Soft. Bowel sounds are normal. He exhibits no distension. There is no tenderness.  Musculoskeletal: Normal range of motion. He exhibits no edema.  There is mild tenderness in the soft tissues of the lumbar  region.  Neurological: He is alert and oriented to person, place, and time. Coordination normal.  Patellar and Achilles DTRs are trace and symmetrical in both lower extremities.  Strength is 5 out of 5 in both lower extremities.  He is ambulatory without difficulty.  Skin: Skin is warm and dry. He is not diaphoretic.  Nursing note and vitals reviewed.    ED Treatments / Results  Labs (all labs ordered are listed, but only abnormal results are displayed) Labs Reviewed - No data to display  EKG None  Radiology No results found.  Procedures Procedures (including critical care time)  Medications Ordered in ED Medications - No data to display   Initial Impression / Assessment and Plan / ED Course  I have reviewed the triage vital signs and the nursing notes.  Pertinent labs & imaging results that were available during my care of the patient were reviewed by me and considered in my medical decision making (see chart for details).  Patient is a very poor historian and his complaints are somewhat difficult to comprehend.  He reports that he developed an exacerbation of his chronic low back pain which caused his legs to "lock up".  This lasted for approximately a half an hour, then resolved.  He is neurologically intact and has no bowel or bladder complaints.  I am uncertain as to what caused this episode, but will make arrangements for an outpatient MRI.  He is followed by Dr. Aundria Rud for some sort of back issue, the specifics of which I am uncertain and do not have access to in the medical record.  He does have an appointment to see Dr. Aundria Rud in the near future.  I have advised him to keep this appointment to follow-up the results of this MRI.  Final Clinical Impressions(s) / ED Diagnoses   Final diagnoses:  None    ED Discharge Orders    None       Geoffery Lyons, MD 12/21/17 [phone]

## 2017-12-21 NOTE — Discharge Instructions (Addendum)
Return for your outpatient MRI at the given time.  Continue your other medications as previously prescribed.  Follow-up with Dr. Aundria Rud as previously arranged.

## 2017-12-21 NOTE — ED Notes (Signed)
Bed: RU04 Expected date:  Expected time:  Means of arrival:  Comments: 57 yo M  Back pain, neuropathy

## 2017-12-21 NOTE — ED Triage Notes (Signed)
Pt brought in by EMS from home with c/o exacerbation of his chronic back pain.  Pt reports that he has been having unrelieved pain from back down to lower legs.  Pt has arthritis.

## 2017-12-22 ENCOUNTER — Emergency Department (HOSPITAL_COMMUNITY)
Admission: EM | Admit: 2017-12-22 | Discharge: 2017-12-22 | Disposition: A | Discharge status: Home / Self Care | Payer: Medicaid Other | Attending: Emergency Medicine | Admitting: Emergency Medicine

## 2017-12-22 ENCOUNTER — Ambulatory Visit (HOSPITAL_COMMUNITY)
Admission: RE | Admit: 2017-12-22 | Discharge: 2017-12-22 | Disposition: A | Discharge status: Home / Self Care | Payer: Medicaid Other | Source: Ambulatory Visit | Attending: Emergency Medicine | Admitting: Emergency Medicine

## 2017-12-22 ENCOUNTER — Ambulatory Visit (HOSPITAL_COMMUNITY): Payer: Self-pay

## 2017-12-22 ENCOUNTER — Other Ambulatory Visit: Payer: Self-pay

## 2017-12-22 ENCOUNTER — Encounter (HOSPITAL_COMMUNITY): Payer: Self-pay | Admitting: Emergency Medicine

## 2017-12-22 DIAGNOSIS — M47817 Spondylosis without myelopathy or radiculopathy, lumbosacral region: Secondary | ICD-10-CM | POA: Insufficient documentation

## 2017-12-22 DIAGNOSIS — M48061 Spinal stenosis, lumbar region without neurogenic claudication: Secondary | ICD-10-CM | POA: Insufficient documentation

## 2017-12-22 DIAGNOSIS — F17210 Nicotine dependence, cigarettes, uncomplicated: Secondary | ICD-10-CM | POA: Insufficient documentation

## 2017-12-22 DIAGNOSIS — G8929 Other chronic pain: Secondary | ICD-10-CM

## 2017-12-22 DIAGNOSIS — M4807 Spinal stenosis, lumbosacral region: Secondary | ICD-10-CM | POA: Insufficient documentation

## 2017-12-22 DIAGNOSIS — M47816 Spondylosis without myelopathy or radiculopathy, lumbar region: Secondary | ICD-10-CM | POA: Insufficient documentation

## 2017-12-22 DIAGNOSIS — M549 Dorsalgia, unspecified: Secondary | ICD-10-CM | POA: Insufficient documentation

## 2017-12-22 DIAGNOSIS — R2990 Unspecified symptoms and signs involving the nervous system: Secondary | ICD-10-CM | POA: Insufficient documentation

## 2017-12-22 DIAGNOSIS — M545 Low back pain: Secondary | ICD-10-CM | POA: Insufficient documentation

## 2017-12-22 DIAGNOSIS — Z79899 Other long term (current) drug therapy: Secondary | ICD-10-CM | POA: Insufficient documentation

## 2017-12-22 NOTE — ED Notes (Signed)
Waiting for transport to go to MRI

## 2017-12-22 NOTE — ED Triage Notes (Signed)
Pt was seen at [facility] yesterday and was told to follow up here for an MRI of his lumbar spine. Pt currently has d/c papers with order for OP MRI lumbar spine.

## 2017-12-22 NOTE — ED Provider Notes (Signed)
[facility] EMERGENCY DEPARTMENT Provider Note   CSN: [id number] Arrival date & time: 12/22/17  1517     History   Chief Complaint Chief Complaint  Patient presents with  . Back Pain    HPI Logan Bailey is a 57 y.o. male with history of chronic back pain who presents for MRI.  Patient was seen 2 days ago [facility] and scheduled for outpatient MRI because MRI cannot be done overnight, however patient checked into the emergency department.  He needed a different order for the MRI to be completed today.  Patient's back pain is the same as usual.  Has some pain rating down his legs bilaterally.  He occasionally has paresthesia, but none today.  He denies any saddle anesthesia, loss of bowel or bladder control, urinary symptoms, fevers, history of procedure to back, known cancer, history of IVDU.  HPI  Past Medical History:  Diagnosis Date  . Seasonal allergies     There are no active problems to display for this patient.   Past Surgical History:  Procedure Laterality Date  . HERNIA REPAIR  10/16/2012   incarcerated  . INGUINAL HERNIA REPAIR Left 10/16/2012   Procedure: HERNIA REPAIR INGUINAL ADULT;  Surgeon: Axel Filler, MD;  Location: [facility] OR;  Service: General;  Laterality: Left;  . NO PAST SURGERIES          Home Medications    Prior to Admission medications   Medication Sig Start Date End Date Taking? Authorizing Provider  diphenhydrAMINE (BENADRYL) 25 MG tablet Take 25 mg by mouth every 6 (six) hours as needed for itching or allergies.    [provider]  escitalopram (LEXAPRO) 20 MG tablet Take 1 tablet (20 mg total) by mouth daily. 11/08/17   Loletta Specter, PA-C  gabapentin (NEURONTIN) 300 MG capsule Take 2 capsules (600 mg total) by mouth 3 (three) times daily. 11/08/17   Loletta Specter, PA-C  ranitidine (ZANTAC) 150 MG capsule Take 1 capsule (150 mg total) by mouth 2 (two) times daily. 04/11/15   Blake Divine, MD  vitamin B-12  (CYANOCOBALAMIN) 100 MCG tablet Take 100 mcg by mouth daily.     [provider]    Family History No family history on file.  Social History Social History   Tobacco Use  . Smoking status: Current Every Day Smoker    Packs/day: 1.00    Types: Cigarettes  . Smokeless tobacco: Never Used  Substance Use Topics  . Alcohol use: No  . Drug use: Yes    Types: Marijuana     Allergies   Patient has no known allergies.   Review of Systems Review of Systems  Constitutional: Negative for fever.  Musculoskeletal: Positive for back pain.  Neurological: Negative for numbness.     Physical Exam Updated Vital Signs BP (!) 134/94 (BP Location: Right Arm)   Pulse (!) 58   Temp 97.9 F (36.6 C) (Oral)   Resp 16   Ht  (1.88 m)   Wt 73.5 kg (162 lb)   SpO2 99%   BMI 20.80 kg/m   Physical Exam  Constitutional: He appears well-developed and well-nourished. No distress.  HENT:  Head: Normocephalic and atraumatic.  Mouth/Throat: Oropharynx is clear and moist.  Eyes: Pupils are equal, round, and reactive to light. Conjunctivae are normal. Right eye exhibits no discharge. Left eye exhibits no discharge. No scleral icterus.  Neck: Normal range of motion.  Cardiovascular: Normal rate, regular rhythm, normal heart sounds  and intact distal pulses. Exam reveals no gallop and no friction rub.  No murmur heard. Pulmonary/Chest: Effort normal and breath sounds normal. No stridor. No respiratory distress. He has no wheezes. He has no rales.  Musculoskeletal: He exhibits no edema.       Lumbar back: He exhibits tenderness and bony tenderness.       Back:  5/5 strength bilaterally, normal sensation to bilateral lower extremities  Neurological: He is alert. Coordination normal.  Skin: Skin is warm and dry. No rash noted. He is not diaphoretic. No pallor.  Psychiatric: He has a normal mood and affect.  Nursing note and vitals reviewed.    ED Treatments / Results  Labs (all  labs ordered are listed, but only abnormal results are displayed) Labs Reviewed - No data to display  EKG None  Radiology No results found.  Procedures Procedures (including critical care time)  Medications Ordered in ED Medications - No data to display   Initial Impression / Assessment and Plan / ED Course  I have reviewed the triage vital signs and the nursing notes.  Pertinent labs & imaging results that were available during my care of the patient were reviewed by me and considered in my medical decision making (see chart for details).     Patient presenting to the emergency department for previously scheduled outpatient MRI.  He checked in the emergency department without knowing the correct area to go for his MRI.  Normal neuro exam today.  No signs of cauda equina.  Will discharge to MRI and patient will be sent home from there for PCP follow-up of the MRI.  Patient understands and agrees with plan. Patient with vitals stable throughout ED course and discharged in satisfactory condition.  Final Clinical Impressions(s) / ED Diagnoses   Final diagnoses:  Chronic midline low back pain, with sciatica presence unspecified    ED Discharge Orders    None       Emi Holes, PA-C 12/22/17 1646    Azalia Bilis, MD 12/23/17 [phone]

## 2017-12-22 NOTE — ED Notes (Signed)
Call placed to MRI x 2 without answer

## 2017-12-22 NOTE — Discharge Instructions (Signed)
Your doctor will follow up on your MRI.  If you do not receive results by tomorrow, please call your doctor's office.  Please return to the emergency department if you develop any new or worsening symptoms including complete numbness in your groin, loss of bowel or bladder control, fever, inability to walk, or any other new concerning symptom.

## 2017-12-27 ENCOUNTER — Ambulatory Visit (INDEPENDENT_AMBULATORY_CARE_PROVIDER_SITE_OTHER): Payer: Self-pay | Admitting: Nurse Practitioner

## 2017-12-27 ENCOUNTER — Encounter (INDEPENDENT_AMBULATORY_CARE_PROVIDER_SITE_OTHER): Payer: Self-pay | Admitting: Nurse Practitioner

## 2017-12-27 ENCOUNTER — Other Ambulatory Visit: Payer: Self-pay

## 2017-12-27 VITALS — BP 129/74 | HR 53 | Temp 97.5°F | Ht 74.0 in | Wt 155.6 lb

## 2017-12-27 DIAGNOSIS — F17210 Nicotine dependence, cigarettes, uncomplicated: Secondary | ICD-10-CM

## 2017-12-27 DIAGNOSIS — M5440 Lumbago with sciatica, unspecified side: Secondary | ICD-10-CM

## 2017-12-27 DIAGNOSIS — B070 Plantar wart: Secondary | ICD-10-CM

## 2017-12-27 MED FILL — GABAPENTIN 300 MG CAPSULE: 300 | 30 days supply | Qty: 180 | Fill #1

## 2017-12-27 NOTE — Patient Instructions (Signed)
Plantar Warts Plantar warts are small growths on the bottom of the foot (sole). Warts are caused by a type of germ (virus). Most warts are not painful, and they usually do not cause problems. Sometimes, plantar warts can cause pain when you walk. Warts often go away on their own in time. Treatments may be done if needed. Follow these instructions at home: General instructions  Apply creams or solutions only as told by your doctor. Follow these steps if your doctor tells you to do so: ? Soak your foot in warm water. ? Remove the top layer of softened skin before you apply the medicine. You can use a pumice stone to remove the tissue. ? After you apply the medicine, put a bandage over the area of the wart. ? Repeat the process every day or as told by your doctor.  Do not scratch or pick at a wart.  Wash your hands after you touch a wart.  If a wart is painful, try putting a bandage with a hole in the middle over the wart.  Keep all follow-up visits as told by your doctor. This is important. Prevention  Wear shoes and socks. Change socks every day.  Keep your feet clean and dry.  Check your feet often.  Avoid direct contact with warts on other people. Contact a doctor if:  Your warts do not improve after treatment.  You have redness, swelling, or pain at the site of a wart.  You have bleeding from a wart, and the bleeding does not stop when you put light pressure on the wart.  You have diabetes and you get a wart. This information is not intended to replace advice given to you by your health care provider. Make sure you discuss any questions you have with your health care provider. Document Released: 08/28/2010 Document Revised: 01/01/2016 Document Reviewed: 10/21/2014 Elsevier Interactive Patient Education  2018 Elsevier Inc.  

## 2017-12-27 NOTE — Progress Notes (Signed)
Assessment & Plan:  Logan Bailey was seen today for foot pain.  Diagnoses and all orders for this visit:  Plantar warts -     Ambulatory referral to Podiatry  Tobacco dependence due to cigarettes Logan Bailey was counseled on the dangers of tobacco use, and was advised to quit. Reviewed strategies to maximize success, including removing cigarettes and smoking materials from environment, stress management and support of family/friends as well as pharmacological alternatives including: Wellbutrin, Chantix, Nicotine patch, Nicotine gum or lozenges. Smoking cessation support: smoking cessation hotline: 1-800-QUIT-NOW.  Smoking cessation classes are also available through [facility]. Call [phone] or visit our website at [email or web address].   A total of 3 minutes was spent on counseling for smoking cessation and Logan Bailey is not ready to quit.   Patient has been counseled on age-appropriate routine health concerns for screening and prevention. These are reviewed and up-to-date. Referrals have been placed accordingly. Immunizations are up-to-date or declined.    Subjective:   Chief Complaint  Patient presents with  . Foot Pain    bilateral    HPI Logan Bailey 57 y.o. male presents to office today with complaints of bilateral foot pain.  Prescribed gabapentin in the past for bilateral foot pain with radiating pain into his bilateral upper thighs.  Endorses minimal relief of pain with taking gabapentin.  Foot Pain Chronic. Patient complains of warts.  Reports this is been ongoing for over a year.  He was referred to podiatry in January however did not have insurance at that time.  Since then he has been approved for the in-house financial assistance and at this time I will refer him to podiatry.  It appears that he has multiple plantar warts on both feet.  The warts are located on the plantar side of the right foot below the great toe and the left foot below the fifth toe.   Aggravating factors: Walking and direct pressure.  Areas on both feet are very painful.  He is currently denies any cellulitic infection symptoms.  He has not been treated in the past with any salicylic acid.  Review of Systems  Constitutional: Negative for fever, malaise/fatigue and weight loss.  HENT: Negative.  Negative for nosebleeds.   Eyes: Negative.  Negative for blurred vision, double vision and photophobia.  Respiratory: Negative.  Negative for cough and shortness of breath.   Cardiovascular: Negative.  Negative for chest pain, palpitations and leg swelling.  Gastrointestinal: Negative.  Negative for heartburn, nausea and vomiting.  Musculoskeletal:       See HPI  Neurological: Negative.  Negative for dizziness, focal weakness, seizures and headaches.  Psychiatric/Behavioral: Negative.  Negative for suicidal ideas.    Past Medical History:  Diagnosis Date  . Depression   . GERD (gastroesophageal reflux disease)   . Seasonal allergies     Past Surgical History:  Procedure Laterality Date  . HERNIA REPAIR  10/16/2012   incarcerated  . INGUINAL HERNIA REPAIR Left 10/16/2012   Procedure: HERNIA REPAIR INGUINAL ADULT;  Surgeon: Axel Filler, MD;  Location: [facility] OR;  Service: General;  Laterality: Left;  . NO PAST SURGERIES      History reviewed. No pertinent family history.  Social History Reviewed with no changes to be made today.   Outpatient Medications Prior to Visit  Medication Sig Dispense Refill  . diphenhydrAMINE (BENADRYL) 25 MG tablet Take 25 mg by mouth every 6 (six) hours as needed for itching or allergies.    Marland Kitchen escitalopram (LEXAPRO) 20  MG tablet Take 1 tablet (20 mg total) by mouth daily. 30 tablet 5  . gabapentin (NEURONTIN) 300 MG capsule Take 2 capsules (600 mg total) by mouth 3 (three) times daily. 180 capsule 5  . ranitidine (ZANTAC) 150 MG capsule Take 1 capsule (150 mg total) by mouth 2 (two) times daily. 6 capsule 0  . vitamin B-12 (CYANOCOBALAMIN)  100 MCG tablet Take 100 mcg by mouth daily.      No facility-administered medications prior to visit.     No Known Allergies     Objective:    BP 129/74 (BP Location: Left Arm, Patient Position: Sitting, Cuff Size: Normal)   Pulse (!) 53   Temp (!) 97.5 F (36.4 C) (Oral)   Ht  (1.88 m)   Wt 155 lb 9.6 oz (70.6 kg)   SpO2 100%   BMI 19.98 kg/m  Wt Readings from Last 3 Encounters:  12/27/17 155 lb 9.6 oz (70.6 kg)  12/22/17 162 lb (73.5 kg)  12/21/17 160 lb (72.6 kg)    Physical Exam  Constitutional: He is oriented to person, place, and time. He appears well-developed and well-nourished. He is cooperative.  HENT:  Head: Normocephalic and atraumatic.  Eyes: EOM are normal.  Neck: Normal range of motion.  Cardiovascular: Normal rate, regular rhythm and normal heart sounds. Exam reveals no gallop and no friction rub.  No murmur heard. Pulmonary/Chest: Effort normal and breath sounds normal. No tachypnea. No respiratory distress. He has no decreased breath sounds. He has no wheezes. He has no rhonchi. He has no rales. He exhibits no tenderness.  Abdominal: Soft. Bowel sounds are normal.  Musculoskeletal: Normal range of motion. He exhibits no edema.  Feet:  Right Foot:  Skin Integrity: Positive for callus (Painful hardened callused area on plantar side under great toe) and dry skin. Negative for ulcer, blister, skin breakdown, erythema or warmth.  Left Foot:  Skin Integrity: Positive for callus (Painful hardened callused area on plantar side under fifth toe) and dry skin. Negative for ulcer, blister, skin breakdown, erythema or warmth.  Neurological: He is alert and oriented to person, place, and time. Coordination normal.  Skin: Skin is warm and dry.  Psychiatric: He has a normal mood and affect. His behavior is normal. Judgment and thought content normal.  Nursing note and vitals reviewed.      Patient has been counseled extensively about nutrition and exercise as  well as the importance of adherence with medications and regular follow-up. The patient was given clear instructions to go to ER or return to medical center if symptoms don't improve, worsen or new problems develop. The patient verbalized understanding.   Follow-up: Return in about 2 months (around 02/26/2018) for F/U Plantar warts.   Claiborne Rigg, FNP-BC [facility] [place], [place] [phone]   12/27/2017, 11:47 AM

## 2017-12-28 ENCOUNTER — Telehealth (INDEPENDENT_AMBULATORY_CARE_PROVIDER_SITE_OTHER): Payer: Self-pay

## 2017-12-28 NOTE — Telephone Encounter (Signed)
Patient voicemail has not been set up yet. Will try calling patient again. Maryjean Morn, CMA

## 2017-12-28 NOTE — Telephone Encounter (Signed)
-----   Message from Zelda W Fleming, NP sent at 12/27/2017 11:49 AM EDT ----- Patient has areas of bulging disks in the lumbar region which is likely contributing to his back pain with sciatica.  He will be referred to spine specialist for further evaluation and treatment. 

## 2017-12-30 ENCOUNTER — Telehealth (INDEPENDENT_AMBULATORY_CARE_PROVIDER_SITE_OTHER): Payer: Self-pay

## 2017-12-30 NOTE — Telephone Encounter (Signed)
-----   Message from Zelda W Fleming, NP sent at 12/27/2017 11:49 AM EDT ----- Patient has areas of bulging disks in the lumbar region which is likely contributing to his back pain with sciatica.  He will be referred to spine specialist for further evaluation and treatment. 

## 2017-12-30 NOTE — Telephone Encounter (Signed)
Patient voicemail box not set up yet. Will call back. Maryjean Morn, CMA

## 2018-01-03 ENCOUNTER — Telehealth (INDEPENDENT_AMBULATORY_CARE_PROVIDER_SITE_OTHER): Payer: Self-pay

## 2018-01-03 NOTE — Telephone Encounter (Signed)
-----   Message from Claiborne Rigg, NP sent at 12/27/2017 11:49 AM EDT ----- Patient has areas of bulging disks in the lumbar region which is likely contributing to his back pain with sciatica.  He will be referred to spine specialist for further evaluation and treatment.

## 2018-01-03 NOTE — Telephone Encounter (Signed)
Patient is aware that MRI shows areas of bulging disks in the lumbar(lower back) region which likely contributing to his back pain with sciatica. Referred to spine specialist for further treatment and evaluation. Patient informed that ortho has attempted to contact him three times on all available numbers for him. Patient provided with the number to [facility] to schedule appointment. Maryjean Morn, CMA

## 2018-01-13 ENCOUNTER — Ambulatory Visit (INDEPENDENT_AMBULATORY_CARE_PROVIDER_SITE_OTHER): Payer: Self-pay | Admitting: Podiatry

## 2018-01-13 DIAGNOSIS — Q828 Other specified congenital malformations of skin: Secondary | ICD-10-CM | POA: Insufficient documentation

## 2018-01-13 NOTE — Progress Notes (Signed)
Subjective:   Patient ID: Logan Bailey, male   DOB: 57 y.o.   MRN: [id number]   HPI 57 year old male presents the office today for concerns of painful lesions to the balls of both of his feet which is been ongoing for some time.  Denies any redness or drainage or any swelling.  No recent treatment for it.  He has no other concerns to his feet today.  Denies any open sores.   Review of Systems  All other systems reviewed and are negative.  Past Medical History:  Diagnosis Date  . Depression   . GERD (gastroesophageal reflux disease)   . Seasonal allergies     Past Surgical History:  Procedure Laterality Date  . HERNIA REPAIR  10/16/2012   incarcerated  . INGUINAL HERNIA REPAIR Left 10/16/2012   Procedure: HERNIA REPAIR INGUINAL ADULT;  Surgeon: Axel FillerArmando Ramirez, MD;  Location: [facility] OR;  Service: General;  Laterality: Left;  . NO PAST SURGERIES       Current Outpatient Medications:  .  diphenhydrAMINE (BENADRYL) 25 MG tablet, Take 25 mg by mouth every 6 (six) hours as needed for itching or allergies., Disp: , Rfl:  .  escitalopram (LEXAPRO) 20 MG tablet, Take 1 tablet (20 mg total) by mouth daily., Disp: 30 tablet, Rfl: 5 .  gabapentin (NEURONTIN) 300 MG capsule, Take 2 capsules (600 mg total) by mouth 3 (three) times daily., Disp: 180 capsule, Rfl: 5 .  ranitidine (ZANTAC) 150 MG capsule, Take 1 capsule (150 mg total) by mouth 2 (two) times daily., Disp: 6 capsule, Rfl: 0 .  vitamin B-12 (CYANOCOBALAMIN) 100 MCG tablet, Take 100 mcg by mouth daily. , Disp: , Rfl:   No Known Allergies        Objective:  Physical Exam  General: AAO x3, NAD  Dermatological: Thick hyperkeratotic lesions bilateral submetatarsal 1 and 5.  Upon debridement no underlying ulceration, drainage or any clinical signs of infection is noted.  There is no open lesions or pre-ulceration identified otherwise.  Vascular: Dorsalis Pedis artery and Posterior Tibial artery pedal pulses are 2/4 bilateral with  immedate capillary fill time.  There is no pain with calf compression, swelling, warmth, erythema.   Neruologic: Grossly intact via light touch bilateral.  Protective threshold with Semmes Wienstein monofilament intact to all pedal sites bilateral.   Musculoskeletal: No gross boney pedal deformities bilateral. No pain, crepitus, or limitation noted with foot and ankle range of motion bilateral. Muscular strength 5/5 in all groups tested bilateral.  Gait: Unassisted, Nonantalgic.      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  Discussed moisturizer to the area daily.  Offloading pads dispensed.  Follow-up as needed.  Call any questions or concerns.  Vivi BarrackMatthew R Reatha Sur DPM

## 2018-01-16 MED FILL — ESCITALOPRAM 20 MG TABLET: 20 | 30 days supply | Qty: 30 | Fill #2

## 2018-01-17 DIAGNOSIS — Q828 Other specified congenital malformations of skin: Secondary | ICD-10-CM

## 2018-01-19 ENCOUNTER — Encounter: Payer: Self-pay | Admitting: Podiatry

## 2018-01-19 ENCOUNTER — Telehealth: Payer: Self-pay | Admitting: *Deleted

## 2018-01-19 ENCOUNTER — Ambulatory Visit (INDEPENDENT_AMBULATORY_CARE_PROVIDER_SITE_OTHER): Payer: No Typology Code available for payment source | Admitting: Podiatry

## 2018-01-19 DIAGNOSIS — R252 Cramp and spasm: Secondary | ICD-10-CM

## 2018-01-19 DIAGNOSIS — B351 Tinea unguium: Secondary | ICD-10-CM

## 2018-01-19 DIAGNOSIS — I739 Peripheral vascular disease, unspecified: Secondary | ICD-10-CM

## 2018-01-19 NOTE — Telephone Encounter (Signed)
-----   Message from Vivi BarrackMatthew R Wagoner, DPM sent at 01/19/2018  1:25 PM EDT ----- Can you order arterial studies due to cramping and heaviness in legs? Thanks.

## 2018-01-19 NOTE — Telephone Encounter (Signed)
Faxed orders for ABI with TBI and arterial dopplers to [facility].

## 2018-01-23 ENCOUNTER — Ambulatory Visit (INDEPENDENT_AMBULATORY_CARE_PROVIDER_SITE_OTHER): Payer: No Typology Code available for payment source | Admitting: Orthopaedic Surgery

## 2018-01-23 ENCOUNTER — Other Ambulatory Visit (INDEPENDENT_AMBULATORY_CARE_PROVIDER_SITE_OTHER): Payer: Self-pay | Admitting: *Deleted

## 2018-01-23 ENCOUNTER — Encounter (INDEPENDENT_AMBULATORY_CARE_PROVIDER_SITE_OTHER): Payer: Self-pay | Admitting: Orthopaedic Surgery

## 2018-01-23 VITALS — BP 110/54 | HR 66 | Resp 14 | Ht 74.0 in | Wt 160.0 lb

## 2018-01-23 DIAGNOSIS — M5442 Lumbago with sciatica, left side: Secondary | ICD-10-CM

## 2018-01-23 DIAGNOSIS — M5441 Lumbago with sciatica, right side: Principal | ICD-10-CM

## 2018-01-23 DIAGNOSIS — G8929 Other chronic pain: Secondary | ICD-10-CM

## 2018-01-23 NOTE — Progress Notes (Signed)
Office Visit Note   Patient: Logan Bailey           Date of Birth: [birth year]           MRN: [id number] Visit Date: 01/23/2018              Requested by: Loletta SpecterGomez, Roger David, PA-C [address] [place], [place] [id number] PCP: Loletta SpecterGomez, Roger David, PA-C   Assessment & Plan: Visit Diagnoses:  1. Chronic left-sided low back pain with left-sided sciatica     Plan: MRI scan ordered by his primary care physician was performed 12/22/2017.  I reviewed the report demonstrating degenerative changes with disc bulging at L4-5, L5-S1 and mild to moderate degree.  Neuroforaminal stenosis at L3-4 through L5-S1.  He is experiencing claudication particularly in his left lower extremity.  Long discussion over nearly 30 minutes regarding his present problem.  I think it is worth starting with an epidural steroid injection.  We will schedule this and have him return next 3 to 4 weeks  Follow-Up Instructions: Return in about 1 month (around 02/22/2018).   Orders:  No orders of the defined types were placed in this encounter.  No orders of the defined types were placed in this encounter.     Procedures: No procedures performed   Clinical Data: No additional findings.   Subjective: Chief Complaint  Patient presents with  . Lower Back - Follow-up  . Follow-up    MRI RESULTS  Mr. Logan Bailey notes a long history of low back pain associated with discomfort into his left lower extremity.  He appears to have more trouble when he stands or walks for a length of time.  He was recently evaluated by his primary care physician who ordered an MRI scan.  Scan notes multiple areas of degenerative arthrosis in the lumbar spine with foraminal stenosis at at least 3 levels.  Experience any motor or sensory loss.  No bowel or bladder changes  HPI  Review of Systems  Constitutional: Positive for activity change.  HENT: Negative for trouble swallowing.   Eyes: Positive for pain and redness.  Respiratory:  Negative for shortness of breath.   Cardiovascular: Positive for leg swelling.  Gastrointestinal: Positive for constipation and diarrhea.  Endocrine: Positive for heat intolerance.  Genitourinary: Negative for difficulty urinating.  Musculoskeletal: Positive for back pain and gait problem.  Skin: Negative for rash.  Allergic/Immunologic: Negative for food allergies.  Neurological: Positive for numbness.  Hematological: Does not bruise/bleed easily.  Psychiatric/Behavioral: Positive for sleep disturbance.     Objective: Vital Signs: BP (!) 110/54 (BP Location: Left Arm, Patient Position: Sitting, Cuff Size: Normal)   Pulse 66   Resp 14   Ht 6\' 2"  (1.88 m)   Wt 160 lb (72.6 kg)   BMI 20.54 kg/m   Physical Exam  Constitutional: He is oriented to person, place, and time. He appears well-developed and well-nourished.  HENT:  Mouth/Throat: Oropharynx is clear and moist.  Eyes: Pupils are equal, round, and reactive to light. EOM are normal.  Pulmonary/Chest: Effort normal.  Neurological: He is alert and oriented to person, place, and time.  Skin: Skin is warm and dry.  Psychiatric: He has a normal mood and affect. His behavior is normal.    Ortho Exam oriented x3.  Comfortable sitting.  Straight leg raise negative bilaterally.  Good pulses.  No distal edema.  Motor exam intact.  Painless range of motion both hips and both knees.  No percussible tenderness of the  lumbar spine.  Specialty Comments:  No specialty comments available.  Imaging: No results found.   PMFS History: Patient Active Problem List   Diagnosis Date Noted  . Porokeratosis 01/13/2018   Past Medical History:  Diagnosis Date  . Depression   . GERD (gastroesophageal reflux disease)   . Seasonal allergies     History reviewed. No pertinent family history.  Past Surgical History:  Procedure Laterality Date  . HERNIA REPAIR  10/16/2012   incarcerated  . INGUINAL HERNIA REPAIR Left 10/16/2012   Procedure:  HERNIA REPAIR INGUINAL ADULT;  Surgeon: Axel Filler, MD;  Location: [facility] OR;  Service: General;  Laterality: Left;   Social History   Occupational History  . Not on file  Tobacco Use  . Smoking status: Current Every Day Smoker    Packs/day: 1.00    Years: 30.00    Pack years: 30.00    Types: Cigarettes  . Smokeless tobacco: Never Used  Substance and Sexual Activity  . Alcohol use: No  . Drug use: Yes    Types: Marijuana  . Sexual activity: Not on file

## 2018-01-24 ENCOUNTER — Other Ambulatory Visit: Payer: Self-pay

## 2018-01-24 ENCOUNTER — Encounter (INDEPENDENT_AMBULATORY_CARE_PROVIDER_SITE_OTHER): Payer: Self-pay | Admitting: Physician Assistant

## 2018-01-24 ENCOUNTER — Ambulatory Visit (INDEPENDENT_AMBULATORY_CARE_PROVIDER_SITE_OTHER): Payer: Self-pay | Admitting: Physician Assistant

## 2018-01-24 VITALS — BP 121/78 | HR 54 | Temp 97.7°F | Ht 74.0 in | Wt 159.0 lb

## 2018-01-24 DIAGNOSIS — F17200 Nicotine dependence, unspecified, uncomplicated: Secondary | ICD-10-CM

## 2018-01-24 DIAGNOSIS — K089 Disorder of teeth and supporting structures, unspecified: Secondary | ICD-10-CM

## 2018-01-24 MED ORDER — VARENICLINE TARTRATE 0.5 MG X 11 & 1 MG X 42 PO MISC: ORAL | 0 refills | Status: DC

## 2018-01-24 NOTE — Patient Instructions (Signed)

## 2018-01-24 NOTE — Progress Notes (Signed)
Subjective:  Patient ID: Logan Bailey, male    DOB: 17-Apr-1961  Age: 57 y.o. MRN: [id number]  CC: tobacco dependence  HPI Logan Bailey is a 57 y.o. male with a medical history of Depression, anxiety, PAD, chronic bilateral LBP, incarcerated hernia, tobacco dependence, chronic pain syndrome, GERD, onychomycosis, and plantar warts presents for tobacco dependence. Started smoking at 57 years old. Most he has ever smoked was a pack and a half a day. Still smokes a pack and a half a day. Has occasional SOB but mostly "on hot days". Endorse night sweats but only when home is warm or hot outside. Does not endorse appetite changes or unintentional weight loss. Endorses occasional left fingertip numbness. Denies CP, palpitations, presyncope, syncope, orthopnea, PND, HA, abdominal pain, f/c/n/v, rash, GI/GU sxs.        Outpatient Medications Prior to Visit  Medication Sig Dispense Refill  . diphenhydrAMINE (BENADRYL) 25 MG tablet Take 25 mg by mouth every 6 (six) hours as needed for itching or allergies.    Marland Kitchen escitalopram (LEXAPRO) 20 MG tablet Take 1 tablet (20 mg total) by mouth daily. 30 tablet 5  . gabapentin (NEURONTIN) 300 MG capsule Take 2 capsules (600 mg total) by mouth 3 (three) times daily. 180 capsule 5  . ranitidine (ZANTAC) 150 MG capsule Take 1 capsule (150 mg total) by mouth 2 (two) times daily. 6 capsule 0  . vitamin B-12 (CYANOCOBALAMIN) 100 MCG tablet Take 100 mcg by mouth daily.      No facility-administered medications prior to visit.      ROS Review of Systems  Constitutional: Negative for chills, fever and malaise/fatigue.  Eyes: Negative for blurred vision.  Respiratory: Negative for shortness of breath.   Cardiovascular: Negative for chest pain and palpitations.  Gastrointestinal: Negative for abdominal pain and nausea.  Genitourinary: Negative for dysuria and hematuria.  Musculoskeletal: Negative for joint pain and myalgias.  Skin: Negative for rash.   Neurological: Negative for tingling and headaches.  Psychiatric/Behavioral: Negative for depression. The patient is not nervous/anxious.     Objective:  BP 121/78 (BP Location: Left Arm, Patient Position: Sitting, Cuff Size: Normal)   Pulse (!) 54   Temp 97.7 F (36.5 C) (Oral)   Ht 6\' 2"  (1.88 m)   Wt 159 lb (72.1 kg)   SpO2 97%   BMI 20.41 kg/m   BP/Weight 01/24/2018 01/23/2018 12/27/2017  Systolic BP 121 110 129  Diastolic BP 78 54 74  Wt. (Lbs) 159 160 155.6  BMI 20.41 20.54 19.98      Physical Exam  Constitutional: He is oriented to person, place, and time.  Well developed, well nourished, NAD, polite  HENT:  Head: Normocephalic and atraumatic.  No oral erythroplakia or leukoplakia seen. Multiple decayed and missing teeth with sever gingivitis noted.   Eyes: No scleral icterus.  Neck: Normal range of motion. Neck supple. No thyromegaly present.  Cardiovascular: Normal rate, regular rhythm and normal heart sounds.  Pulmonary/Chest: Effort normal and breath sounds normal.  Musculoskeletal: He exhibits no edema.  Neurological: He is alert and oriented to person, place, and time.  Skin: Skin is warm and dry. No rash noted. No erythema. No pallor.  Psychiatric: He has a normal mood and affect. His behavior is normal. Thought content normal.  Vitals reviewed.    Assessment & Plan:    1. Tobacco dependence - Begin varenicline (CHANTIX STARTING MONTH PAK) 0.5 MG X 11 & 1 MG X 42 tablet; Take one 0.5 mg  tablet by mouth once daily for 3 days, then increase to one 0.5 mg tablet twice daily for 4 days, then increase to one 1 mg tablet twice daily.  Dispense: 53 tablet; Refill: 0  2. Poor dentition - Pt hold the [employer] and therefore I have given him the information to contact the [employer] [employer].    Meds ordered this encounter  Medications  . varenicline (CHANTIX STARTING MONTH PAK) 0.5 MG X 11 & 1 MG X 42 tablet    Sig: Take one 0.5 mg tablet by  mouth once daily for 3 days, then increase to one 0.5 mg tablet twice daily for 4 days, then increase to one 1 mg tablet twice daily.    Dispense:  53 tablet    Refill:  0    Order Specific Question:   Supervising Provider    Answer:   Hoy RegisterNEWLIN, ENOBONG [[id number]]    Follow-up: 4 weeks for tobacco cessation.  Loletta Specteroger David Lamel Mccarley PA

## 2018-01-24 NOTE — Progress Notes (Signed)
Subjective: 57 year old male presents the office today for concerns of some toenail discoloration to the medial aspect left second toe.  He also states that he describes the pain bump in the toe of the leg at times he describes cramping to his legs.  He states that he cannot walk for long period of time without getting cramps or pain to his leg.  Does not appear that he has had any arterial studies previously. Denies any systemic complaints such as fevers, chills, nausea, vomiting. No acute changes since last appointment, and no other complaints at this time.   Objective: AAO x3, NAD DP/PT pulses palpable and CRT less than 3 seconds however he is describing some claudication symptoms to his legs. On the medial aspect left second digit toenail there is a small amount of blood distally the nail is somewhat hypertrophic, dystrophic.  Is able to debride this and there is no extension of any hyperpigmentation of the surrounding skin.  There is no edema, erythema, drainage or pus.  There is no clinical signs of infection.  There is no open lesions or pre-ulcerative lesion identified today. No areas of pinpoint bony tenderness or pain with vibratory sensation. MMT 5/5, ROM WNL. No edema, erythema, increase in warmth to bilateral lower extremities.  No pain with calf compression, swelling, warmth, erythema  Assessment: Left second digit toenail onychodystrophy, concern for PAD  Plan: -All treatment options discussed with the patient including all alternatives, risks, complications.  -Although he has palpable pulses he is describing pain to his legs and his thigh as well.  He cannot walk for more than a block given pain.  Because of this we will order arterial studies.  I did debride the left second digit toenail without any complications or bleeding.  There is no open lesions but continue to monitor for any other issues. -Patient encouraged to call the office with any questions, concerns, change in  symptoms.   Vivi BarrackMatthew R Wagoner DPM

## 2018-01-25 ENCOUNTER — Ambulatory Visit (HOSPITAL_COMMUNITY)
Admission: RE | Admit: 2018-01-25 | Discharge: 2018-01-25 | Disposition: A | Discharge status: Home / Self Care | Payer: Self-pay | Source: Ambulatory Visit | Attending: Podiatry | Admitting: Podiatry

## 2018-01-25 ENCOUNTER — Emergency Department (HOSPITAL_COMMUNITY): Admit: 2018-01-25 | Payer: Medicaid Other | Source: Home / Self Care

## 2018-01-25 DIAGNOSIS — F17210 Nicotine dependence, cigarettes, uncomplicated: Secondary | ICD-10-CM | POA: Insufficient documentation

## 2018-01-25 DIAGNOSIS — R252 Cramp and spasm: Secondary | ICD-10-CM | POA: Insufficient documentation

## 2018-01-25 DIAGNOSIS — R9389 Abnormal findings on diagnostic imaging of other specified body structures: Secondary | ICD-10-CM | POA: Insufficient documentation

## 2018-01-25 NOTE — Progress Notes (Signed)
Bilateral ABI and TBI completed. ABIs and Doppler waveforms are within normal limits at rest. Right TBI is abnormal and the left TBI is normal. Toma DeitersVirginia Hadriel Bailey, RVS  01/25/2018 9:57 AM

## 2018-01-27 ENCOUNTER — Ambulatory Visit (INDEPENDENT_AMBULATORY_CARE_PROVIDER_SITE_OTHER): Payer: Self-pay | Admitting: Physician Assistant

## 2018-01-30 ENCOUNTER — Telehealth: Payer: Self-pay | Admitting: *Deleted

## 2018-01-30 NOTE — Telephone Encounter (Signed)
-----   Message from Vivi BarrackMatthew R Wagoner, DPM sent at 01/27/2018  6:23 AM EDT ----- Please let him know that overall his circulation is adequate. He has a little decrease in the right big toe but otherwise it is normal.

## 2018-01-30 NOTE — Telephone Encounter (Signed)
I informed pt of Dr. Gabriel RungWagoner's review of results. Pt asked if he needed another appt. I reviewed the LOV and Dr. Ardelle AntonWagoner had indicated pt could be seen as needed and I informed the pt. Pt states he has a lot of stuff pushing out of his toes and needs to be seen. I transferred to pt.

## 2018-01-30 NOTE — Telephone Encounter (Signed)
Left message on pt's mobile phone to call for circulation results. Male speaker answered pt's phone and said she would have pt call me for results.

## 2018-02-06 MED FILL — $CHANTIX STARTING MONTH BOX: 0.5 MG X 11 | 28 days supply | Qty: 53 | Fill #0

## 2018-02-06 MED FILL — GABAPENTIN 300 MG CAPSULE: 300 | 30 days supply | Qty: 180 | Fill #2

## 2018-02-17 ENCOUNTER — Encounter: Payer: Self-pay | Admitting: Podiatry

## 2018-02-17 ENCOUNTER — Ambulatory Visit (INDEPENDENT_AMBULATORY_CARE_PROVIDER_SITE_OTHER): Payer: No Typology Code available for payment source | Admitting: Podiatry

## 2018-02-17 DIAGNOSIS — Q828 Other specified congenital malformations of skin: Secondary | ICD-10-CM

## 2018-02-17 DIAGNOSIS — B351 Tinea unguium: Secondary | ICD-10-CM

## 2018-02-20 ENCOUNTER — Ambulatory Visit (INDEPENDENT_AMBULATORY_CARE_PROVIDER_SITE_OTHER): Payer: Self-pay | Admitting: Physician Assistant

## 2018-02-20 ENCOUNTER — Other Ambulatory Visit: Payer: Self-pay

## 2018-02-20 ENCOUNTER — Encounter (INDEPENDENT_AMBULATORY_CARE_PROVIDER_SITE_OTHER): Payer: Self-pay | Admitting: Physician Assistant

## 2018-02-20 VITALS — BP 129/78 | HR 52 | Temp 97.9°F | Ht 74.0 in | Wt 162.2 lb

## 2018-02-20 DIAGNOSIS — M5442 Lumbago with sciatica, left side: Secondary | ICD-10-CM

## 2018-02-20 DIAGNOSIS — M5441 Lumbago with sciatica, right side: Secondary | ICD-10-CM

## 2018-02-20 DIAGNOSIS — F418 Other specified anxiety disorders: Secondary | ICD-10-CM

## 2018-02-20 DIAGNOSIS — G8929 Other chronic pain: Secondary | ICD-10-CM

## 2018-02-20 DIAGNOSIS — F17200 Nicotine dependence, unspecified, uncomplicated: Secondary | ICD-10-CM

## 2018-02-20 MED ORDER — GABAPENTIN 300 MG PO CAPS: 600.0000 mg | ORAL_CAPSULE | Freq: Three times a day (TID) | ORAL | 5 refills | Status: DC

## 2018-02-20 MED ORDER — VARENICLINE TARTRATE 1 MG PO TABS: 1.0000 mg | ORAL_TABLET | Freq: Two times a day (BID) | ORAL | 4 refills | Status: DC

## 2018-02-20 MED ORDER — ESCITALOPRAM OXALATE 20 MG PO TABS: 20.0000 mg | ORAL_TABLET | Freq: Every day | ORAL | 5 refills | Status: DC

## 2018-02-20 MED FILL — ESCITALOPRAM 20 MG TABLET: 20 | 30 days supply | Qty: 30 | Fill #3

## 2018-02-20 NOTE — Patient Instructions (Signed)

## 2018-02-20 NOTE — Progress Notes (Signed)
Subjective: 57 year old male presents the office today for concerns of painful calluses to both of his feet.  She is the toenail of the left second toe is gotten better.  Denies any pain to the nailbed any redness or injury swelling.  No other concerns today.  Objective: AAO x3, NAD DP/PT pulses palpable and CRT less than 3 seconds  Hyperkeratotic lesions bilateral symmetrical 1 and 5.  Upon debridement there is no underlying ulceration drainage or signs of infection.  Toenails appear to be doing much better there is no pain in the nails there is no cellulitis or drainage specifically the right second digit toenail.  No open lesions identified. No pain with calf compression, swelling, warmth, erythema  Assessment: Left second digit toenail onychodystrophy, porokeratosis  Plan: -All treatment options discussed with the patient including all alternatives, risks, complications.  -I reviewed the vascular studies with him again today.  The toenail appears to be doing better.  I sharply debrided the hyperkeratotic lesions x4 without any complications or bleeding.  Discussed moisturizer to the callus sites daily but not interdigitally.  No other concerns.  Vivi BarrackMatthew R Racine Erby DPM

## 2018-02-20 NOTE — Progress Notes (Signed)
Subjective:  Patient ID: Logan Bailey, male    DOB: [birth year]  Age: 57 y.o. MRN: [id number]  CC: f/u chantix  HPI Logan Bailey is a 57 y.o. male with a medical history of Depression, anxiety, PAD, chronic bilateral LBP, incarcerated hernia, tobacco dependence, chronic pain syndrome, GERD, onychomycosis, and plantar warts presents for tobacco dependence f/u. Was prescribed Chantix starting month pack and he has not felt the need to smoke since day one of Chantix. Says he is feeling well and taking medications as directed. His mood is also improved with escitalopram and the reduction of his back pain. Says Gabapentin 600 mg TID is extremely effective for him and feels nearly no sciatic pain. Overall, very pleased with medical treatment. Does not endorse any other symptoms or complaints.     Outpatient Medications Prior to Visit  Medication Sig Dispense Refill  . escitalopram (LEXAPRO) 20 MG tablet Take 1 tablet (20 mg total) by mouth daily. 30 tablet 5  . gabapentin (NEURONTIN) 300 MG capsule Take 2 capsules (600 mg total) by mouth 3 (three) times daily. 180 capsule 5  . varenicline (CHANTIX STARTING MONTH PAK) 0.5 MG X 11 & 1 MG X 42 tablet Take one 0.5 mg tablet by mouth once daily for 3 days, then increase to one 0.5 mg tablet twice daily for 4 days, then increase to one 1 mg tablet twice daily. 53 tablet 0  . diphenhydrAMINE (BENADRYL) 25 MG tablet Take 25 mg by mouth every 6 (six) hours as needed for itching or allergies.    . ranitidine (ZANTAC) 150 MG capsule Take 1 capsule (150 mg total) by mouth 2 (two) times daily. (Patient not taking: Reported on 02/20/2018) 6 capsule 0  . vitamin B-12 (CYANOCOBALAMIN) 100 MCG tablet Take 100 mcg by mouth daily.      No facility-administered medications prior to visit.      ROS Review of Systems  Constitutional: Negative for chills, fever and malaise/fatigue.  Eyes: Negative for blurred vision.  Respiratory: Negative for shortness of  breath.   Cardiovascular: Negative for chest pain and palpitations.  Gastrointestinal: Negative for abdominal pain and nausea.  Genitourinary: Negative for dysuria and hematuria.  Musculoskeletal: Negative for joint pain and myalgias.  Skin: Negative for rash.  Neurological: Negative for tingling and headaches.  Psychiatric/Behavioral: Negative for depression. The patient is not nervous/anxious.     Objective:  BP 129/78 (BP Location: Left Arm, Patient Position: Sitting, Cuff Size: Normal)   Pulse (!) 52   Temp 97.9 F (36.6 C) (Oral)   Ht 6\' 2"  (1.88 m)   Wt 162 lb 3.2 oz (73.6 kg)   SpO2 97%   BMI 20.83 kg/m   BP/Weight 02/20/2018 01/24/2018 01/23/2018  Systolic BP 129 121 110  Diastolic BP 78 78 54  Wt. (Lbs) 162.2 159 160  BMI 20.83 20.41 20.54      Physical Exam  Constitutional: He is oriented to person, place, and time.  Well developed, well nourished, NAD, polite  HENT:  Head: Normocephalic and atraumatic.  Eyes: No scleral icterus.  Neck: Normal range of motion. Neck supple. No thyromegaly present.  Cardiovascular: Normal rate, regular rhythm and normal heart sounds.  Pulmonary/Chest: Effort normal and breath sounds normal.  Abdominal: Soft. Bowel sounds are normal. There is no tenderness.  Musculoskeletal: He exhibits no edema.  Neurological: He is alert and oriented to person, place, and time.  Skin: Skin is warm and dry. No rash noted. No erythema. No pallor.  Psychiatric: He has a normal mood and affect. His behavior is normal. Thought content normal.  Vitals reviewed.    Assessment & Plan:   1. Tobacco use disorder - Continue and finish Chantix starting month Pack - Begin varenicline (CHANTIX CONTINUING MONTH PAK) 1 MG tablet; Take 1 tablet (1 mg total) by mouth 2 (two) times daily.  Dispense: 60 tablet; Refill: 4   Meds ordered this encounter  Medications  . varenicline (CHANTIX CONTINUING MONTH PAK) 1 MG tablet    Sig: Take 1 tablet (1 mg total)  by mouth 2 (two) times daily.    Dispense:  60 tablet    Refill:  4    Order Specific Question:   Supervising Provider    Answer:   Hoy Register [[id number]]    Follow-up: Return in about 4 months (around 06/23/2018) for Tobacco use disorder.   Loletta Specter PA

## 2018-02-24 ENCOUNTER — Encounter (INDEPENDENT_AMBULATORY_CARE_PROVIDER_SITE_OTHER): Payer: Self-pay | Admitting: Orthopaedic Surgery

## 2018-02-24 ENCOUNTER — Ambulatory Visit (INDEPENDENT_AMBULATORY_CARE_PROVIDER_SITE_OTHER): Payer: No Typology Code available for payment source | Admitting: Orthopaedic Surgery

## 2018-02-24 ENCOUNTER — Encounter (INDEPENDENT_AMBULATORY_CARE_PROVIDER_SITE_OTHER): Payer: Self-pay

## 2018-02-24 VITALS — BP 132/84 | HR 58 | Ht 74.0 in | Wt 162.0 lb

## 2018-02-24 DIAGNOSIS — M5441 Lumbago with sciatica, right side: Secondary | ICD-10-CM

## 2018-02-24 DIAGNOSIS — G8929 Other chronic pain: Secondary | ICD-10-CM

## 2018-02-24 DIAGNOSIS — M5442 Lumbago with sciatica, left side: Secondary | ICD-10-CM

## 2018-02-24 NOTE — Progress Notes (Signed)
   Office Visit Note   Patient: Logan Bailey           Date of Birth: 1961-04-06           MRN: [id number] Visit Date: 02/24/2018              Requested by: Loletta SpecterGomez, Roger David, PA-C [address] [place], [place] [address] PCP: Loletta SpecterGomez, Roger David, PA-C   Assessment & Plan: Visit Diagnoses: No diagnosis found.  Plan: no show  Follow-Up Instructions: No follow-ups on file.

## 2018-03-09 ENCOUNTER — Ambulatory Visit (INDEPENDENT_AMBULATORY_CARE_PROVIDER_SITE_OTHER): Payer: Self-pay | Admitting: Physician Assistant

## 2018-03-15 ENCOUNTER — Ambulatory Visit (INDEPENDENT_AMBULATORY_CARE_PROVIDER_SITE_OTHER): Payer: No Typology Code available for payment source | Admitting: Physical Medicine and Rehabilitation

## 2018-03-15 ENCOUNTER — Ambulatory Visit (INDEPENDENT_AMBULATORY_CARE_PROVIDER_SITE_OTHER): Payer: Self-pay

## 2018-03-15 ENCOUNTER — Encounter (INDEPENDENT_AMBULATORY_CARE_PROVIDER_SITE_OTHER): Payer: Self-pay | Admitting: Physical Medicine and Rehabilitation

## 2018-03-15 VITALS — BP 131/82 | HR 47

## 2018-03-15 DIAGNOSIS — M5416 Radiculopathy, lumbar region: Secondary | ICD-10-CM

## 2018-03-15 MED ORDER — BETAMETHASONE SOD PHOS & ACET 6 (3-3) MG/ML IJ SUSP
12.0000 mg | Freq: Once | INTRAMUSCULAR | Status: AC
  Administered 2018-03-15: 12 mg

## 2018-03-15 NOTE — Patient Instructions (Signed)

## 2018-03-15 NOTE — Progress Notes (Signed)
 .  Numeric Pain Rating Scale and Functional Assessment Average Pain 7   In the last MONTH (on 0-10 scale) has pain interfered with the following?  1. General activity like being  able to carry out your everyday physical activities such as walking, climbing stairs, carrying groceries, or moving a chair?  Rating(4)   +Driver, -BT, -Dye Allergies.  

## 2018-03-16 ENCOUNTER — Ambulatory Visit (INDEPENDENT_AMBULATORY_CARE_PROVIDER_SITE_OTHER): Payer: No Typology Code available for payment source | Admitting: Podiatry

## 2018-03-16 ENCOUNTER — Encounter: Payer: Self-pay | Admitting: Podiatry

## 2018-03-16 DIAGNOSIS — Q828 Other specified congenital malformations of skin: Secondary | ICD-10-CM

## 2018-03-20 NOTE — Progress Notes (Signed)
Subjective: 57 year old male presents the office today for concerns of painful calluses to both of his feet.  He denies any recent injury to his feet and denies any ulceration.  No other concerns today.  Objective: AAO x3, NAD DP/PT pulses palpable and CRT less than 3 seconds  Hyperkeratotic lesions bilateral symmetrical 1 and 5.  Upon debridement there is no underlying ulceration drainage or signs of infection.   No open lesions identified. No significant swelling bilterally.  No pain with calf compression, swelling, warmth, erythema  Assessment: Left second digit toenail onychodystrophy, porokeratosis  Plan: -All treatment options discussed with the patient including all alternatives, risks, complications.  -I sharply debrided the hyperkeratotic lesions x4 without any complications or bleeding.  Discussed moisturizer to the callus sites daily but not interdigitally.  No other concerns.  Vivi BarrackMatthew R Wagoner DPM

## 2018-03-20 NOTE — Procedures (Signed)
Lumbosacral Transforaminal Epidural Steroid Injection - Sub-Pedicular Approach with Fluoroscopic Guidance  Patient: Logan Bailey      Date of Birth: Feb 25, 1961 MRN: [id number] PCP: Loletta SpecterGomez, Roger David, PA-C      Visit Date: 03/15/2018   Universal Protocol:    Date/Time: 03/15/2018  Consent Given By: the patient  Position: PRONE  Additional Comments: Vital signs were monitored before and after the procedure. Patient was prepped and draped in the usual sterile fashion. The correct patient, procedure, and site was verified.   Injection Procedure Details:  Procedure Site One Meds Administered:  Meds ordered this encounter  Medications  . betamethasone acetate-betamethasone sodium phosphate (CELESTONE) injection 12 mg    Laterality: Bilateral  Location/Site:  L4-L5  Needle size: 22 G  Needle type: Spinal  Needle Placement: Transforaminal  Findings:    -Comments: Excellent flow of contrast along the nerve and into the epidural space.  Procedure Details: After squaring off the end-plates to get a true AP view, the C-arm was positioned so that an oblique view of the foramen as noted above was visualized. The target area is just inferior to the "nose of the scotty dog" or sub pedicular. The soft tissues overlying this structure were infiltrated with 2-3 ml. of 1% Lidocaine without Epinephrine.  The spinal needle was inserted toward the target using a "trajectory" view along the fluoroscope beam.  Under AP and lateral visualization, the needle was advanced so it did not puncture dura and was located close the 6 O'Clock position of the pedical in AP tracterory. Biplanar projections were used to confirm position. Aspiration was confirmed to be negative for CSF and/or blood. A 1-2 ml. volume of Isovue-250 was injected and flow of contrast was noted at each level. Radiographs were obtained for documentation purposes.   After attaining the desired flow of contrast documented above,  a 0.5 to 1.0 ml test dose of 0.25% Marcaine was injected into each respective transforaminal space.  The patient was observed for 90 seconds post injection.  After no sensory deficits were reported, and normal lower extremity motor function was noted,   the above injectate was administered so that equal amounts of the injectate were placed at each foramen (level) into the transforaminal epidural space.   Additional Comments:  The patient tolerated the procedure well Dressing: Band-Aid    Post-procedure details: Patient was observed during the procedure. Post-procedure instructions were reviewed.  Patient left the clinic in stable condition.

## 2018-03-20 NOTE — Progress Notes (Signed)
Logan Bailey - 57 y.o. male MRN [id number]  Date of birth: [birth year]  Office Visit Note: Visit Date: 03/15/2018 PCP: Loletta SpecterGomez, Roger David, PA-C Referred by: Loletta SpecterGomez, Roger David, PA-C  Subjective: Chief Complaint  Patient presents with  . Lower Back - Pain  . Right Leg - Numbness, Pain  . Left Leg - Numbness, Pain   HPI: Logan Bailey is a 57 year old gentleman with chronic worsening low back and bilateral hip and leg pain really more of an L3 and L4 distribution.  He has been seen and followed by Dr. Norlene CampbellPeter Whitfield who ultimately did obtain MRI of the lumbar spine after the patient failed conservative care.  His MRI is reviewed below and reviewed with the patient.  He has broad disc bulging and some central canal narrowing and lateral recess narrowing particular L3-4 L4-5 and L5-S1.  No frank nerve compression.   ROS Otherwise per HPI.  Assessment & Plan: Visit Diagnoses:  1. Lumbar radiculopathy     Plan: No additional findings.   Meds & Orders:  Meds ordered this encounter  Medications  . betamethasone acetate-betamethasone sodium phosphate (CELESTONE) injection 12 mg    Orders Placed This Encounter  Procedures  . XR C-ARM NO REPORT  . Epidural Steroid injection    Follow-up: Return if symptoms worsen or fail to improve.   Procedures: No procedures performed  Lumbosacral Transforaminal Epidural Steroid Injection - Sub-Pedicular Approach with Fluoroscopic Guidance  Patient: Logan Bailey      Date of Birth: [birth year] MRN: [id number] PCP: Loletta SpecterGomez, Roger David, PA-C      Visit Date: 03/15/2018   Universal Protocol:    Date/Time: 03/15/2018  Consent Given By: the patient  Position: PRONE  Additional Comments: Vital signs were monitored before and after the procedure. Patient was prepped and draped in the usual sterile fashion. The correct patient, procedure, and site was verified.   Injection Procedure Details:  Procedure Site One Meds Administered:  Meds  ordered this encounter  Medications  . betamethasone acetate-betamethasone sodium phosphate (CELESTONE) injection 12 mg    Laterality: Bilateral  Location/Site:  L4-L5  Needle size: 22 G  Needle type: Spinal  Needle Placement: Transforaminal  Findings:    -Comments: Excellent flow of contrast along the nerve and into the epidural space.  Procedure Details: After squaring off the end-plates to get a true AP view, the C-arm was positioned so that an oblique view of the foramen as noted above was visualized. The target area is just inferior to the "nose of the scotty dog" or sub pedicular. The soft tissues overlying this structure were infiltrated with 2-3 ml. of 1% Lidocaine without Epinephrine.  The spinal needle was inserted toward the target using a "trajectory" view along the fluoroscope beam.  Under AP and lateral visualization, the needle was advanced so it did not puncture dura and was located close the 6 O'Clock position of the pedical in AP tracterory. Biplanar projections were used to confirm position. Aspiration was confirmed to be negative for CSF and/or blood. A 1-2 ml. volume of Isovue-250 was injected and flow of contrast was noted at each level. Radiographs were obtained for documentation purposes.   After attaining the desired flow of contrast documented above, a 0.5 to 1.0 ml test dose of 0.25% Marcaine was injected into each respective transforaminal space.  The patient was observed for 90 seconds post injection.  After no sensory deficits were reported, and normal lower extremity motor function was noted,   the above  injectate was administered so that equal amounts of the injectate were placed at each foramen (level) into the transforaminal epidural space.   Additional Comments:  The patient tolerated the procedure well Dressing: Band-Aid    Post-procedure details: Patient was observed during the procedure. Post-procedure instructions were reviewed.  Patient  left the clinic in stable condition.    Clinical History: MRI LUMBAR SPINE WITHOUT CONTRAST  TECHNIQUE: Multiplanar, multisequence MR imaging of the lumbar spine was performed. No intravenous contrast was administered.  COMPARISON:  None.  FINDINGS: Segmentation:  Standard.  Alignment: Minimal scoliosis. No evidence of acute vertebral body subluxation.  Vertebrae: No acute or suspicious osseous finding. Degenerative marrow changes about the L3-4 and L5-S1 in place.  Conus medullaris and cauda equina: Conus extends to the L1-2 level. Conus and cauda equina appear normal.  Paraspinal and other soft tissues: Unremarkable.  Disc levels:  L1-2: Disc spaces well preserved. No significant central canal or neural foramen stenosis.  L2-3: Disc spaces well preserved. No significant central canal or neural foramen stenosis.  L3-4: Mild disc desiccation with loss of disc space height. Additional mild degenerative facet hypertrophy bilaterally with associated hypertrophy of the ligamentum flavum. Associated minimal central canal stenosis and mild to moderate LEFT neural foramen stenosis.  L4-5: Disc desiccation with associated broad-based diffuse disc bulge. Additional degenerative facet hypertrophy and associated hypertrophy of the ligamentum flavum, combined with the disc bulge, resulting in mild central canal stenosis and mild to moderate bilateral neural foramen stenoses.  L5-S1: Disc desiccation with associated broad-based disc bulge. Additional degenerative facet hypertrophy and associated hypertrophied ligamentum flavum, combined with the disc bulge, resulting in mild central canal stenosis and mild to moderate bilateral neural foramen stenoses.  IMPRESSION: 1. Degenerative changes, as detailed above, with associated disc bulges at the L4-5 and L5-S1 levels, mild to moderate in degree. No more than mild central canal stenosis at any level. Neural  foramen stenoses are present at the L3-4 through L5-S1 levels, mild to moderate in degree, with possible associated mass effect on the exiting nerve roots. 2. No acute findings.   Electronically Signed   By: Logan RichardStan  Bailey M.D.   On: 12/22/2017 18:33   He reports that he has quit smoking. His smoking use included cigarettes. He has a 30.00 pack-year smoking history. He has never used smokeless tobacco. No results for input(s): HGBA1C, LABURIC in the last 8760 hours.  Objective:  VS:  HT:    WT:   BMI:     BP:131/82  HR:(!) 47bpm  TEMP: ( )  RESP:  Physical Exam  Ortho Exam Imaging: No results found.  Past Medical/Family/Surgical/Social History: Medications & Allergies reviewed per EMR, new medications updated. Patient Active Problem List   Diagnosis Date Noted  . Porokeratosis 01/13/2018   Past Medical History:  Diagnosis Date  . Depression   . GERD (gastroesophageal reflux disease)   . Seasonal allergies    History reviewed. No pertinent family history. Past Surgical History:  Procedure Laterality Date  . HERNIA REPAIR  10/16/2012   incarcerated  . INGUINAL HERNIA REPAIR Left 10/16/2012   Procedure: HERNIA REPAIR INGUINAL ADULT;  Surgeon: Axel FillerArmando Ramirez, MD;  [facility];  Service: General;  Laterality: Left;   Social History   Occupational History  . Not on file  Tobacco Use  . Smoking status: Former Smoker    Packs/day: 1.00    Years: 30.00    Pack years: 30.00    Types: Cigarettes  . Smokeless tobacco: Never Used  .  Tobacco comment: no cigarette in 14 days   Substance and Sexual Activity  . Alcohol use: No  . Drug use: Yes    Types: Marijuana  . Sexual activity: Not on file

## 2018-03-22 MED FILL — $CHANTIX 1 MG CONT MONTH BO: 1 | 28 days supply | Qty: 56 | Fill #0

## 2018-03-22 MED FILL — GABAPENTIN 300 MG CAPSULE: 300 | 30 days supply | Qty: 180 | Fill #3

## 2018-03-22 MED FILL — ESCITALOPRAM 20 MG TABLET: 20 | 30 days supply | Qty: 30 | Fill #4

## 2018-03-24 ENCOUNTER — Ambulatory Visit: Payer: Self-pay | Attending: Physician Assistant

## 2018-03-27 ENCOUNTER — Ambulatory Visit (INDEPENDENT_AMBULATORY_CARE_PROVIDER_SITE_OTHER): Payer: Self-pay | Admitting: Orthopaedic Surgery

## 2018-03-27 ENCOUNTER — Encounter (INDEPENDENT_AMBULATORY_CARE_PROVIDER_SITE_OTHER): Payer: Self-pay | Admitting: Orthopaedic Surgery

## 2018-03-27 VITALS — BP 127/75 | HR 61 | Ht 74.0 in | Wt 162.0 lb

## 2018-03-27 DIAGNOSIS — M79672 Pain in left foot: Secondary | ICD-10-CM

## 2018-03-27 NOTE — Progress Notes (Signed)
Office Visit Note   Patient: Logan PoetCalvin R Wingate           Date of Birth: [birth year]           MRN: [id number] Visit Date: 03/27/2018              Requested by: Loletta SpecterGomez, Roger David, PA-C [address] [place], [place] [address] PCP: Loletta SpecterGomez, Roger David, PA-C   Assessment & Plan: Visit Diagnoses:  1. Left foot pain     Plan: 12 days status post epidural steroid injection by Dr. Alvester MorinNewton.  Mr. Marylyn IshiharaHerbin relates that his back is much better.  He has developed over the past week some problems with his left foot.  He hurts when he wears shoes.  He has been to the podiatrist for some "warts".  Follow-Up Instructions: Return if symptoms worsen or fail to improve.   Orders:  No orders of the defined types were placed in this encounter.  No orders of the defined types were placed in this encounter.     Procedures: No procedures performed   Clinical Data: No additional findings.   Subjective: Chief Complaint  Patient presents with  . Follow-up    INJECTION FROM DR NEWTON HELPED BACK BUT PT IS HAVING SOME FOOT AND LEG PAIN IN THE L 2ND TOE PAIN AND NUMBNESS THAT RADIATES UP THE LEG AND TOP OF THE FOOT  12 days status post epidural steroid injection with significant relief of his back and left lower extremity pain.  In the interim he is developed some pain in his left foot and even along the calf of his leg.  Been seen by the podiatrist for plantar warts.  No injury or trauma.  Not sure why he is having trouble with his foot or his calf.  He certainly has excellent capillary refill to his toes.  I think this just bears watching.  He will follow-up with the podiatrist return to see me as necessary.  He certainly can call Dr. Alvester MorinNewton for his back and repeat injection at any time  HPI  Review of Systems  Constitutional: Negative for fatigue and fever.  HENT: Negative for ear pain.   Eyes: Negative for pain.  Respiratory: Negative for cough and shortness of breath.   Cardiovascular:  Negative for leg swelling.  Gastrointestinal: Negative for constipation and diarrhea.  Genitourinary: Negative for difficulty urinating.  Musculoskeletal: Positive for back pain. Negative for neck pain.  Skin: Negative for rash.  Allergic/Immunologic: Negative for food allergies.  Neurological: Positive for weakness and numbness.  Hematological: Does not bruise/bleed easily.  Psychiatric/Behavioral: Positive for sleep disturbance.     Objective: Vital Signs: BP 127/75 (BP Location: Right Arm, Patient Position: Sitting, Cuff Size: Normal)   Pulse 61   Ht 6\' 2"  (1.88 m)   Wt 162 lb (73.5 kg)   BMI 20.80 kg/m   Physical Exam  Constitutional: He is oriented to person, place, and time. He appears well-developed and well-nourished.  HENT:  Mouth/Throat: Oropharynx is clear and moist.  Eyes: Pupils are equal, round, and reactive to light. EOM are normal.  Pulmonary/Chest: Effort normal.  Neurological: He is alert and oriented to person, place, and time.  Skin: Skin is warm and dry.  Psychiatric: He has a normal mood and affect. His behavior is normal.    Ortho Exam awake alert and oriented x3.  Comfortable sitting.  Straight leg raise negative bilaterally.  Hamstrings will bit tight on the left.  No knee pain or hip  discomfort.  No calf discomfort on the left.  Left lower extremity is warm.  Good pulses.  Good capillary refill to toes.  No pain in the plantar aspect of his foot . the dorsum of his foot looks just a little bit swollen but certainly not erythematous.  Specialty Comments:  No specialty comments available.  Imaging: No results found.   PMFS History: Patient Active Problem List   Diagnosis Date Noted  . Porokeratosis 01/13/2018   Past Medical History:  Diagnosis Date  . Depression   . GERD (gastroesophageal reflux disease)   . Seasonal allergies     History reviewed. No pertinent family history.  Past Surgical History:  Procedure Laterality Date  . HERNIA  REPAIR  10/16/2012   incarcerated  . INGUINAL HERNIA REPAIR Left 10/16/2012   Procedure: HERNIA REPAIR INGUINAL ADULT;  Surgeon: Axel FillerArmando Ramirez, MD;  [facility];  Service: General;  Laterality: Left;   Social History   Occupational History  . Not on file  Tobacco Use  . Smoking status: Former Smoker    Packs/day: 1.00    Years: 30.00    Pack years: 30.00    Types: Cigarettes  . Smokeless tobacco: Never Used  . Tobacco comment: no cigarette in 14 days   Substance and Sexual Activity  . Alcohol use: No  . Drug use: Yes    Types: Marijuana  . Sexual activity: Not on file

## 2018-04-03 ENCOUNTER — Telehealth: Payer: Self-pay | Admitting: Podiatry

## 2018-04-03 NOTE — Telephone Encounter (Signed)
This is Didi with [facility]. We mailed out a medical records request on 13 August and have not received those yet. I was just calling for a status update. If someone would please call me back at [phone]. Thank you.

## 2018-04-17 MED FILL — ESCITALOPRAM 20 MG TABLET: 20 | 30 days supply | Qty: 30 | Fill #5

## 2018-04-19 MED FILL — GABAPENTIN 300 MG CAPSULE: 300 | 30 days supply | Qty: 180 | Fill #4

## 2018-04-26 DIAGNOSIS — M79676 Pain in unspecified toe(s): Secondary | ICD-10-CM

## 2018-04-27 DIAGNOSIS — M79676 Pain in unspecified toe(s): Secondary | ICD-10-CM

## 2018-05-05 ENCOUNTER — Ambulatory Visit: Payer: Self-pay | Attending: Family Medicine

## 2018-05-19 ENCOUNTER — Encounter: Payer: Self-pay | Admitting: Podiatry

## 2018-05-19 ENCOUNTER — Ambulatory Visit (INDEPENDENT_AMBULATORY_CARE_PROVIDER_SITE_OTHER): Payer: No Typology Code available for payment source | Admitting: Podiatry

## 2018-05-19 ENCOUNTER — Ambulatory Visit: Payer: Self-pay | Admitting: Podiatry

## 2018-05-19 DIAGNOSIS — Q828 Other specified congenital malformations of skin: Secondary | ICD-10-CM

## 2018-05-19 DIAGNOSIS — M79672 Pain in left foot: Secondary | ICD-10-CM

## 2018-05-19 DIAGNOSIS — M79671 Pain in right foot: Secondary | ICD-10-CM

## 2018-05-20 NOTE — Progress Notes (Signed)
Subjective: Logan Bailey presents today with painful calluses  which interfere with daily activities and routine tasks. Pain is aggravated when weightbearing with and without  enclosed shoe gear. Pain is getting progressively worse. Patient is seeking professional help regarding symptoms.  Objective: Vascular Examination: Capillary refill time <3 seconds x 10 digits Dorsalis pedis and posterior tibial pulses present b/l No digital hair x 10 digits Skin temperature warm to warm b/l  Dermatological Examination: Skin with normal turgor, texture and tone Hyperkeratotic lesion with nucleated core submetatarsal head 1, 5 b/l. No underlying wounds, no flocculence, no erythema, no edema, no drainage. Negative Homan's sign b/l LE Toenails adequate length on today with no signs of infection  Musculoskeletal: Muscle strength 5/5 to all LE muscle groups  Neurological: Sensation intact with 10 gram monofilament. Vibratory sensation intact.  Assessment: Painful porokeratoses submet head 1, submet head 5 b/l  Plan:  1. Sharply debrided the hyperkeratotic lesions x 4 without any complications or bleeding. Aperture pads applied to lesions post-treatment. Patient noted relief after treatment completed. 2. Patient to continue soft, supportive shoe gear 3. Patient to report any pedal injuries to medical professional immediately. 4. Patient/POA to call should there be a concern in the interim.

## 2018-05-25 DIAGNOSIS — Q828 Other specified congenital malformations of skin: Secondary | ICD-10-CM

## 2018-05-25 MED FILL — ESCITALOPRAM 20 MG TABLET: 20 | 30 days supply | Qty: 30 | Fill #0

## 2018-05-26 MED FILL — GABAPENTIN 300 MG CAPSULE: 300 | 30 days supply | Qty: 180 | Fill #5

## 2018-06-23 ENCOUNTER — Other Ambulatory Visit: Payer: Self-pay

## 2018-06-23 ENCOUNTER — Encounter (INDEPENDENT_AMBULATORY_CARE_PROVIDER_SITE_OTHER): Payer: Self-pay | Admitting: Physician Assistant

## 2018-06-23 ENCOUNTER — Ambulatory Visit (INDEPENDENT_AMBULATORY_CARE_PROVIDER_SITE_OTHER): Payer: Self-pay | Admitting: Physician Assistant

## 2018-06-23 VITALS — BP 138/87 | HR 46 | Temp 97.5°F | Ht 74.0 in | Wt 178.2 lb

## 2018-06-23 DIAGNOSIS — M5441 Lumbago with sciatica, right side: Secondary | ICD-10-CM

## 2018-06-23 DIAGNOSIS — M5442 Lumbago with sciatica, left side: Secondary | ICD-10-CM

## 2018-06-23 DIAGNOSIS — G8929 Other chronic pain: Secondary | ICD-10-CM | POA: Insufficient documentation

## 2018-06-23 DIAGNOSIS — K089 Disorder of teeth and supporting structures, unspecified: Secondary | ICD-10-CM | POA: Insufficient documentation

## 2018-06-23 DIAGNOSIS — F418 Other specified anxiety disorders: Secondary | ICD-10-CM

## 2018-06-23 DIAGNOSIS — F17200 Nicotine dependence, unspecified, uncomplicated: Secondary | ICD-10-CM | POA: Insufficient documentation

## 2018-06-23 MED ORDER — ESCITALOPRAM OXALATE 20 MG PO TABS: 20.0000 mg | ORAL_TABLET | Freq: Every day | ORAL | 5 refills | Status: DC

## 2018-06-23 MED ORDER — GABAPENTIN 300 MG PO CAPS: 600.0000 mg | ORAL_CAPSULE | Freq: Three times a day (TID) | ORAL | 5 refills | Status: DC

## 2018-06-23 MED FILL — ESCITALOPRAM 20 MG TABLET: 20 | 30 days supply | Qty: 30 | Fill #0

## 2018-06-23 MED FILL — GABAPENTIN 300 MG CAPSULE: 300 | 30 days supply | Qty: 180 | Fill #0

## 2018-06-23 NOTE — Progress Notes (Signed)
Subjective:  Patient ID: Logan Bailey, male    DOB: 1960/11/26  Age: 57 y.o. MRN: [id number]  CC: tobacco use disorder  HPI Logan Bailey a 56 y.o.malewith a medical history of Depression, anxiety, PAD, chronic bilateral LBP, incarcerated hernia, tobacco dependence, chronic pain syndrome, GERD, onychomycosis, and plantar warts presents for tobacco dependence f/u. Says he took Chantix as directed for two months and has not needed to take any longer since he has quit smoking completely. Feeling well and proud of himself. Denies CP, palpitations, SOB, cough, HA, abdominal pain, f/c/n/v, rash, swelling, or GI/GU sxs.      Pt taking Escitalopram 20 mg qday as directed and is feeling stable in regards to his mood. He is no longer feeling depressed or anxious. PHQ9 0 and GAD7 3 in clinic today. Requests refills.      States Gabapentin has been very helpful in controlling his sciatica. He is also managed by orthopedics and has received steroid injection into the back. Pt says he will go back to orthopedics for another round of injections if pain returns.     Outpatient Medications Prior to Visit  Medication Sig Dispense Refill  . diphenhydrAMINE (BENADRYL) 25 MG tablet Take 25 mg by mouth every 6 (six) hours as needed for itching or allergies.    Marland Kitchen. escitalopram (LEXAPRO) 20 MG tablet Take 1 tablet (20 mg total) by mouth daily. 30 tablet 5  . gabapentin (NEURONTIN) 300 MG capsule Take 2 capsules (600 mg total) by mouth 3 (three) times daily. 180 capsule 5  . vitamin B-12 (CYANOCOBALAMIN) 100 MCG tablet Take 100 mcg by mouth daily.     . varenicline (CHANTIX CONTINUING MONTH PAK) 1 MG tablet Take 1 tablet (1 mg total) by mouth 2 (two) times daily. (Patient not taking: Reported on 06/23/2018) 60 tablet 4  . varenicline (CHANTIX STARTING MONTH PAK) 0.5 MG X 11 & 1 MG X 42 tablet Take one 0.5 mg tablet by mouth once daily for 3 days, then increase to one 0.5 mg tablet twice daily for 4 days, then  increase to one 1 mg tablet twice daily. 53 tablet 0   No facility-administered medications prior to visit.      ROS Review of Systems  Constitutional: Negative for chills, fever and malaise/fatigue.  Eyes: Negative for blurred vision.  Respiratory: Negative for shortness of breath.   Cardiovascular: Negative for chest pain and palpitations.  Gastrointestinal: Negative for abdominal pain and nausea.  Genitourinary: Negative for dysuria and hematuria.  Musculoskeletal: Negative for joint pain and myalgias.  Skin: Negative for rash.  Neurological: Negative for tingling and headaches.  Psychiatric/Behavioral: Negative for depression. The patient is not nervous/anxious.     Objective:  BP (!) 149/78 (BP Location: Left Arm, Patient Position: Sitting, Cuff Size: Normal)   Pulse (!) 46   Temp (!) 97.5 F (36.4 C) (Oral)   Ht 6\' 2"  (1.88 m)   Wt 178 lb 3.2 oz (80.8 kg)   SpO2 96%   BMI 22.88 kg/m   Vitals:   06/23/18 1056 06/23/18 1101  BP: (!) 149/78 138/87  Pulse: (!) 46   Temp: (!) 97.5 F (36.4 C)   SpO2: 96%      Physical Exam  Constitutional: He is oriented to person, place, and time.  Well developed, well nourished, NAD, polite  HENT:  Head: Normocephalic and atraumatic.  Eyes: No scleral icterus.  Neck: Normal range of motion. Neck supple. No thyromegaly present.  Cardiovascular: Normal rate,  regular rhythm and normal heart sounds.  Pulmonary/Chest: Effort normal and breath sounds normal.  Musculoskeletal: He exhibits no edema.  Neurological: He is alert and oriented to person, place, and time.  Skin: Skin is warm and dry. No rash noted. No erythema. No pallor.  Psychiatric: He has a normal mood and affect. His behavior is normal. Thought content normal.  Vitals reviewed.    Assessment & Plan:    1. Tobacco use disorder - Quit smoking with the use of Chantix.  2. Poor dentition - Ambulatory referral to Dentistry. Gave patient information to [employer] for orange card holders.   3. Depression with anxiety - Refill escitalopram (LEXAPRO) 20 MG tablet; Take 1 tablet (20 mg total) by mouth daily.  Dispense: 30 tablet; Refill: 5  4. Chronic bilateral low back pain with bilateral sciatica - Refill gabapentin (NEURONTIN) 300 MG capsule; Take 2 capsules (600 mg total) by mouth 3 (three) times daily.  Dispense: 180 capsule; Refill: 5   Meds ordered this encounter  Medications  . escitalopram (LEXAPRO) 20 MG tablet    Sig: Take 1 tablet (20 mg total) by mouth daily.    Dispense:  30 tablet    Refill:  5    Order Specific Question:   Supervising Provider    Answer:   Hoy Register [[id number]]  . gabapentin (NEURONTIN) 300 MG capsule    Sig: Take 2 capsules (600 mg total) by mouth 3 (three) times daily.    Dispense:  180 capsule    Refill:  5    Order Specific Question:   Supervising Provider    Answer:   Hoy Register [[id number]]    Follow-up: Return in about 6 months (around 12/22/2018) for Annual physical exam.   Loletta Specter PA

## 2018-06-23 NOTE — Patient Instructions (Signed)

## 2018-08-07 MED FILL — GABAPENTIN 300 MG CAPSULE: 300 | 30 days supply | Qty: 180 | Fill #1

## 2018-08-07 MED FILL — ESCITALOPRAM 20 MG TABLET: 20 | 30 days supply | Qty: 30 | Fill #1

## 2018-08-18 ENCOUNTER — Ambulatory Visit: Payer: No Typology Code available for payment source | Admitting: Podiatry

## 2018-08-18 DIAGNOSIS — Q828 Other specified congenital malformations of skin: Secondary | ICD-10-CM

## 2018-08-18 DIAGNOSIS — M79672 Pain in left foot: Secondary | ICD-10-CM

## 2018-08-18 DIAGNOSIS — M79676 Pain in unspecified toe(s): Secondary | ICD-10-CM

## 2018-08-18 DIAGNOSIS — M79671 Pain in right foot: Secondary | ICD-10-CM

## 2018-08-18 NOTE — Patient Instructions (Addendum)
Onychomycosis/Fungal Toenails  WHAT IS IT? An infection that lies within the keratin of your nail plate that is caused by a fungus.  WHY ME? Fungal infections affect all ages, sexes, races, and creeds.  There may be many factors that predispose you to a fungal infection such as age, coexisting medical conditions such as diabetes, or an autoimmune disease; stress, medications, fatigue, genetics, etc.  Bottom line: fungus thrives in a warm, moist environment and your shoes offer such a location.  IS IT CONTAGIOUS? Theoretically, yes.  You do not want to share shoes, nail clippers or files with someone who has fungal toenails.  Walking around barefoot in the same room or sleeping in the same bed is unlikely to transfer the organism.  It is important to realize, however, that fungus can spread easily from one nail to the next on the same foot.  HOW DO WE TREAT THIS?  There are several ways to treat this condition.  Treatment may depend on many factors such as age, medications, pregnancy, liver and kidney conditions, etc.  It is best to ask your doctor which options are available to you.  1. No treatment.   Unlike many other medical concerns, you can live with this condition.  However for many people this can be a painful condition and may lead to ingrown toenails or a bacterial infection.  It is recommended that you keep the nails cut short to help reduce the amount of fungal nail. 2. Topical treatment.  These range from herbal remedies to prescription strength nail lacquers.  About 40-50% effective, topicals require twice daily application for approximately 9 to 12 months or until an entirely new nail has grown out.  The most effective topicals are medical grade medications available through physicians offices. 3. Oral antifungal medications.  With an 80-90% cure rate, the most common oral medication requires 3 to 4 months of therapy and stays in your system for a year as the new nail grows out.  Oral  antifungal medications do require blood work to make sure it is a safe drug for you.  A liver function panel will be performed prior to starting the medication and after the first month of treatment.  It is important to have the blood work performed to avoid any harmful side effects.  In general, this medication safe but blood work is required. 4. Laser Therapy.  This treatment is performed by applying a specialized laser to the affected nail plate.  This therapy is noninvasive, fast, and non-painful.  It is not covered by insurance and is therefore, out of pocket.  The results have been very good with a 80-95% cure rate.  [facility] is the only practice in the area to offer this therapy. 5. Permanent Nail Avulsion.  Removing the entire nail so that a new nail will not grow back.  Corns and Calluses Corns are small areas of thickened skin that occur on the top, sides, or tip of a toe. They contain a cone-shaped core with a point that can press on a nerve below. This causes pain.  Calluses are areas of thickened skin that can occur anywhere on the body, including the hands, fingers, palms, soles of the feet, and heels. Calluses are usually larger than corns. What are the causes? Corns and calluses are caused by rubbing (friction) or pressure, such as from shoes that are too tight or do not fit properly. What increases the risk? Corns are more likely to develop in people   who have misshapen toes (toe deformities), such as hammer toes. Calluses can occur with friction to any area of the skin. They are more likely to develop in people who:  Work with their hands.  Wear shoes that fit poorly, are too tight, or are high-heeled.  Have toe deformities. What are the signs or symptoms? Symptoms of a corn or callus include:  A hard growth on the skin.  Pain or tenderness under the skin.  Redness and swelling.  Increased discomfort while wearing tight-fitting shoes, if your feet are  affected. If a corn or callus becomes infected, symptoms may include:  Redness and swelling that gets worse.  Pain.  Fluid, blood, or pus draining from the corn or callus. How is this diagnosed? Corns and calluses may be diagnosed based on your symptoms, your medical history, and a physical exam. How is this treated? Treatment for corns and calluses may include:  Removing the cause of the friction or pressure. This may involve: ? Changing your shoes. ? Wearing shoe inserts (orthotics) or other protective layers in your shoes, such as a corn pad. ? Wearing gloves.  Applying medicine to the skin (topical medicine) to help soften skin in the hardened, thickened areas.  Removing layers of dead skin with a file to reduce the size of the corn or callus.  Removing the corn or callus with a scalpel or laser.  Taking antibiotic medicines, if your corn or callus is infected.  Having surgery, if a toe deformity is the cause. Follow these instructions at home:   Take over-the-counter and prescription medicines only as told by your health care provider.  If you were prescribed an antibiotic, take it as told by your health care provider. Do not stop taking it even if your condition starts to improve.  Wear shoes that fit well. Avoid wearing high-heeled shoes and shoes that are too tight or too loose.  Wear any padding, protective layers, gloves, or orthotics as told by your health care provider.  Soak your hands or feet and then use a file or pumice stone to soften your corn or callus. Do this as told by your health care provider.  Check your corn or callus every day for symptoms of infection. Contact a health care provider if you:  Notice that your symptoms do not improve with treatment.  Have redness or swelling that gets worse.  Notice that your corn or callus becomes painful.  Have fluid, blood, or pus coming from your corn or callus.  Have new symptoms. Summary  Corns are  small areas of thickened skin that occur on the top, sides, or tip of a toe.  Calluses are areas of thickened skin that can occur anywhere on the body, including the hands, fingers, palms, and soles of the feet. Calluses are usually larger than corns.  Corns and calluses are caused by rubbing (friction) or pressure, such as from shoes that are too tight or do not fit properly.  Treatment may include wearing any padding, protective layers, gloves, or orthotics as told by your health care provider. This information is not intended to replace advice given to you by your health care provider. Make sure you discuss any questions you have with your health care provider. Document Released: 05/01/2004 Document Revised: 06/08/2017 Document Reviewed: 06/08/2017 Elsevier Interactive Patient Education  2019 [employer]  

## 2018-09-09 ENCOUNTER — Encounter: Payer: Self-pay | Admitting: Podiatry

## 2018-09-09 NOTE — Progress Notes (Signed)
Subjective:  Patient presents to clinic with cc of  painful porokeratoses both feet which are aggravated when weightbearing with and without shoe gear.  This pain limits his daily activities. Pain symptoms resolve with periodic professional debridement.  Loletta Specter, PA-C is his PCP.   Current Outpatient Medications:  .  diphenhydrAMINE (BENADRYL) 25 MG tablet, Take 25 mg by mouth every 6 (six) hours as needed for itching or allergies., Disp: , Rfl:  .  escitalopram (LEXAPRO) 20 MG tablet, Take 1 tablet (20 mg total) by mouth daily., Disp: 30 tablet, Rfl: 5 .  gabapentin (NEURONTIN) 300 MG capsule, Take 2 capsules (600 mg total) by mouth 3 (three) times daily., Disp: 180 capsule, Rfl: 5 .  vitamin B-12 (CYANOCOBALAMIN) 100 MCG tablet, Take 100 mcg by mouth daily. , Disp: , Rfl:    No Known Allergies   Objective:  Physical Examination: Neurovascular status intact b/l feet with palpable pedal pulses bilaterally.  No digital hair x10 digits.  Skin temperature gradient within normal limits bilaterally.  Toenails adequate length today.  Muscle strength 5/5 to all muscle groups b/l LE Negative Homans sign bilateral lower extremities  Porokeratotic lesions noted with both nucleated core submetatarsal head 1, 5 bilaterally.  No erythema, no edema, no drainage, no underlying flocculence noted.  Assessment: Painful porokeratotic lesion submetatarsal head 1, 5 bilaterally  Plan: Painful porokeratotoc lesion pared submetatarsal head 1, 5 bilaterally without incident.  Aperture pads applied to lesions posttreatment patient noted relief of pain symptoms after treatment completed on today. Continue soft, supportive shoe gear daily. Report any pedal injuries to medical professional Follow up 3 months. Call our office should there be any questions/concerns in the interim.

## 2018-09-14 MED FILL — ESCITALOPRAM 20 MG TABLET: 20 | 30 days supply | Qty: 30 | Fill #2

## 2018-10-02 MED FILL — GABAPENTIN 300 MG CAPSULE: 300 | 30 days supply | Qty: 180 | Fill #2

## 2018-10-12 ENCOUNTER — Encounter (INDEPENDENT_AMBULATORY_CARE_PROVIDER_SITE_OTHER): Payer: Self-pay | Admitting: Orthopaedic Surgery

## 2018-10-25 ENCOUNTER — Ambulatory Visit: Payer: Self-pay | Attending: Family Medicine

## 2018-10-25 ENCOUNTER — Other Ambulatory Visit: Payer: Self-pay

## 2018-11-02 MED FILL — ESCITALOPRAM 20 MG TABLET: 20 | 30 days supply | Qty: 30 | Fill #3

## 2018-11-03 ENCOUNTER — Ambulatory Visit: Payer: Self-pay

## 2018-11-04 ENCOUNTER — Encounter (HOSPITAL_COMMUNITY): Payer: Self-pay | Admitting: Emergency Medicine

## 2018-11-04 ENCOUNTER — Other Ambulatory Visit: Payer: Self-pay

## 2018-11-04 ENCOUNTER — Emergency Department (HOSPITAL_COMMUNITY)
Admission: EM | Admit: 2018-11-04 | Discharge: 2018-11-04 | Disposition: A | Discharge status: Home / Self Care | Payer: Medicaid Other | Attending: Emergency Medicine | Admitting: Emergency Medicine

## 2018-11-04 DIAGNOSIS — Z79899 Other long term (current) drug therapy: Secondary | ICD-10-CM | POA: Diagnosis not present

## 2018-11-04 DIAGNOSIS — W269XXA Contact with unspecified sharp object(s), initial encounter: Secondary | ICD-10-CM | POA: Insufficient documentation

## 2018-11-04 DIAGNOSIS — Y939 Activity, unspecified: Secondary | ICD-10-CM | POA: Diagnosis not present

## 2018-11-04 DIAGNOSIS — S91012A Laceration without foreign body, left ankle, initial encounter: Secondary | ICD-10-CM | POA: Diagnosis not present

## 2018-11-04 DIAGNOSIS — Y929 Unspecified place or not applicable: Secondary | ICD-10-CM | POA: Diagnosis not present

## 2018-11-04 DIAGNOSIS — Y999 Unspecified external cause status: Secondary | ICD-10-CM | POA: Diagnosis not present

## 2018-11-04 DIAGNOSIS — Z87891 Personal history of nicotine dependence: Secondary | ICD-10-CM | POA: Insufficient documentation

## 2018-11-04 DIAGNOSIS — S81812A Laceration without foreign body, left lower leg, initial encounter: Secondary | ICD-10-CM

## 2018-11-04 NOTE — ED Triage Notes (Signed)
Pt here for eval of laceration to ankle.

## 2018-11-04 NOTE — Discharge Instructions (Addendum)
Keep ankle clean and dry for 48 hours, steristrips will fall off on own, return if signs of infection.

## 2018-11-04 NOTE — ED Provider Notes (Signed)
[facility] EMERGENCY DEPARTMENT Provider Note   CSN: [id number] Arrival date & time: 11/04/18  1614    History   Chief Complaint Chief Complaint  Patient presents with  . Laceration    HPI Logan Bailey is a 58 y.o. male.     The history is provided by the patient.  Laceration  Location:  Leg Leg laceration location:  L ankle Length:  2 cm Depth:  Cutaneous Quality: straight   Bleeding: controlled with pressure   Laceration mechanism:  Blunt object Pain details:    Severity:  No pain Relieved by:  Nothing Tetanus status:  Up to date   Past Medical History:  Diagnosis Date  . Depression   . GERD (gastroesophageal reflux disease)   . Seasonal allergies     Patient Active Problem List   Diagnosis Date Noted  . Poor dentition 06/23/2018  . Tobacco use disorder 06/23/2018  . Depression with anxiety 06/23/2018  . 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 Porokeratosis 01/13/2018    Past Surgical History:  Procedure Laterality Date  . HERNIA REPAIR  10/16/2012   incarcerated  . INGUINAL HERNIA REPAIR Left 10/16/2012   Procedure: HERNIA REPAIR INGUINAL ADULT;  Surgeon: Axel Filler, MD;  Location: [facility] OR;  Service: General;  Laterality: Left;        Home Medications    Prior to Admission medications   Medication Sig Start Date End Date Taking? Authorizing Provider  diphenhydrAMINE (BENADRYL) 25 MG tablet Take 25 mg by mouth every 6 (six) hours as needed for itching or allergies.    [provider]  escitalopram (LEXAPRO) 20 MG tablet Take 1 tablet (20 mg total) by mouth daily. 06/23/18   Loletta Specter, PA-C  gabapentin (NEURONTIN) 300 MG capsule Take 2 capsules (600 mg total) by mouth 3 (three) times daily. 06/23/18   Loletta Specter, PA-C  vitamin B-12 (CYANOCOBALAMIN) 100 MCG tablet Take 100 mcg by mouth daily.     [provider]    Family History No family history on file.  Social History Social History   Tobacco Use  . Smoking status: Former Smoker    Packs/day: 1.00    Years: 30.00    Pack years: 30.00    Types: Cigarettes    Last attempt to quit: 02/20/2018    Years since quitting: 0.7  . Smokeless tobacco: Never Used  . Tobacco comment: no cigarette in 14 days   Substance Use Topics  . Alcohol use: No  . Drug use: Yes    Types: Marijuana     Allergies   Patient has no known allergies.   Review of Systems Review of Systems  Musculoskeletal: Negative for arthralgias, back pain, gait problem, joint swelling, myalgias, neck pain and neck stiffness.  Skin: Positive for wound.     Physical Exam Updated Vital Signs BP (!) 121/91 (BP Location: Right Arm)   Pulse 69   Temp 98.7 F (37.1 C) (Oral)   Resp 12   SpO2 99%   Physical Exam Musculoskeletal: Normal range of motion.        General: No swelling or tenderness.  Skin:    General: Skin is warm and dry.     Capillary Refill: Capillary refill takes less than 2 seconds.     Comments: 2 cm superficial laceration to left ankle      ED Treatments / Results  Labs (all labs ordered are listed, but only abnormal results  are displayed) Labs Reviewed - No data to display  EKG None  Radiology No results found.  Procedures .Marland KitchenLaceration Repair Date/Time: 11/04/2018 4:40 PM Performed by: Virgina Norfolk, DO Authorized by: Virgina Norfolk, DO   Consent:    Consent obtained:  Verbal   Consent given by:  Patient   Risks discussed:  Infection, need for additional repair, nerve damage, pain, poor cosmetic result, poor wound healing, retained foreign body, tendon damage and vascular damage   Alternatives discussed:  No treatment Laceration details:    Location:  Leg   Leg location:  L lower leg   Length (cm):  2   Depth (mm):  0.5 Repair type:    Repair type:  Simple Pre-procedure details:    Preparation:  Patient was prepped and draped in usual sterile fashion Exploration:     Wound extent: no areolar tissue violation noted, no fascia violation noted, no foreign bodies/material noted, no muscle damage noted, no nerve damage noted, no tendon damage noted, no underlying fracture noted and no vascular damage noted     Contaminated: no   Treatment:    Area cleansed with:  Saline   Amount of cleaning:  Extensive   Irrigation solution:  Sterile saline   Irrigation method:  Pressure wash   Visualized foreign bodies/material removed: no   Skin repair:    Repair method:  Steri-Strips and tissue adhesive   Number of Steri-Strips:  3 Approximation:    Approximation:  Close Post-procedure details:    Dressing:  Open (no dressing)   Patient tolerance of procedure:  Tolerated well, no immediate complications   (including critical care time)  Medications Ordered in ED Medications - No data to display   Initial Impression / Assessment and Plan / ED Course  I have reviewed the triage vital signs and the nursing notes.  Pertinent labs & imaging results that were available during my care of the patient were reviewed by me and considered in my medical decision making (see chart for details).     Logan Bailey is a 58 year old male who presents the ankle with laceration to left ankle.  Wound is hemostatic.  Wound is very superficial.  Was cleaned and Steri-Stripped and glued.  No signs of infection.  Tetanus shot is up-to-date.  Discharged in good condition.  Given return precautions as needed and warned about any signs of infection he should return.  This chart was dictated using voice recognition software.  Despite best efforts to proofread,  errors can occur which can change the documentation meaning.    Final Clinical Impressions(s) / ED Diagnoses   Final diagnoses:  Laceration of skin of left lower leg, initial encounter    ED Discharge Orders    None       Virgina Norfolk, DO 11/04/18 1641

## 2018-11-17 ENCOUNTER — Ambulatory Visit: Payer: Self-pay | Admitting: Podiatry

## 2018-11-20 ENCOUNTER — Ambulatory Visit: Payer: Self-pay | Admitting: Podiatry

## 2018-12-15 ENCOUNTER — Ambulatory Visit (INDEPENDENT_AMBULATORY_CARE_PROVIDER_SITE_OTHER): Payer: Self-pay | Admitting: Primary Care

## 2018-12-26 MED FILL — GABAPENTIN 300 MG CAPSULE: 300 | 30 days supply | Qty: 180 | Fill #3

## 2019-01-18 MED FILL — ESCITALOPRAM 20 MG TABLET: 20 | 30 days supply | Qty: 30 | Fill #4

## 2019-01-30 ENCOUNTER — Ambulatory Visit: Payer: Self-pay | Admitting: Podiatry

## 2019-03-05 MED FILL — GABAPENTIN 300 MG CAPSULE: 300 | 30 days supply | Qty: 180 | Fill #4

## 2019-03-14 ENCOUNTER — Encounter (HOSPITAL_COMMUNITY): Payer: Self-pay

## 2019-03-14 ENCOUNTER — Emergency Department (HOSPITAL_COMMUNITY)
Admission: EM | Admit: 2019-03-14 | Discharge: 2019-03-14 | Disposition: A | Discharge status: Home / Self Care | Payer: Medicaid Other | Attending: Emergency Medicine | Admitting: Emergency Medicine

## 2019-03-14 ENCOUNTER — Other Ambulatory Visit: Payer: Self-pay

## 2019-03-14 DIAGNOSIS — Z87891 Personal history of nicotine dependence: Secondary | ICD-10-CM | POA: Insufficient documentation

## 2019-03-14 DIAGNOSIS — T7840XA Allergy, unspecified, initial encounter: Secondary | ICD-10-CM | POA: Diagnosis not present

## 2019-03-14 DIAGNOSIS — L509 Urticaria, unspecified: Secondary | ICD-10-CM | POA: Diagnosis present

## 2019-03-14 DIAGNOSIS — Z79899 Other long term (current) drug therapy: Secondary | ICD-10-CM | POA: Diagnosis not present

## 2019-03-14 DIAGNOSIS — T63441A Toxic effect of venom of bees, accidental (unintentional), initial encounter: Secondary | ICD-10-CM | POA: Diagnosis not present

## 2019-03-14 MED ORDER — EPINEPHRINE 0.3 MG/0.3ML IJ SOAJ: 0.3000 mg | INTRAMUSCULAR | 0 refills | Status: DC | PRN

## 2019-03-14 MED ORDER — METHYLPREDNISOLONE SODIUM SUCC 125 MG IJ SOLR
125.0000 mg | Freq: Once | INTRAMUSCULAR | Status: AC
  Administered 2019-03-14: 14:00:00 125 mg via INTRAVENOUS
  Filled 2019-03-14: qty 2

## 2019-03-14 MED ORDER — FAMOTIDINE IN NACL 20-0.9 MG/50ML-% IV SOLN
20.0000 mg | Freq: Once | INTRAVENOUS | Status: AC
  Administered 2019-03-14: 20 mg via INTRAVENOUS
  Filled 2019-03-14: qty 50

## 2019-03-14 NOTE — Discharge Instructions (Addendum)
It was our pleasure to provide your ER care today - we hope that you feel better.  Take benadryl 25 mg every 6 hours for the next day, then as need.   In event of future, severe allergic reaction (I.e. unable to breath, throat closing, about to faint) - use epipen as directed, take benadryl 50 mg by mouth, and seek medical attention right away.   For slow heart rate, follow up with your doctor in the next 1-2 weeks.   Return to ER if worse, symptoms recur, hives, wheezing or trouble breathing, or other concern.

## 2019-03-14 NOTE — ED Triage Notes (Signed)
Pt brought in by [facility] for swelling/hives after being stung by a hornet/wasp/bee. Pt has notable puffiness in eyes, hives on bilateral arms.

## 2019-03-14 NOTE — ED Provider Notes (Signed)
[facility]-EMERGENCY DEPT Provider Note   CSN: [id number] Arrival date & time: 03/14/19  1357     History   Chief Complaint Chief Complaint  Patient presents with  . Allergic Reaction    bee sting    HPI Logan Bailey is a 58 y.o. male.     Patient c/o hornet sting to forehead just pta today. Symptoms acute onset, mod-severe, constant, persistent. Denies hx anaphylaxis to bee stings. Shortly after sting developed diffuse urticaria/hives, itching all over, swelling about face. EMS was called - patient received benadryl 50 mg in route to ED. Pt states felt fine just prior to bee sting. No nausea/vomiting. No fever or chills. No syncope. Denies throat swelling or sob.    The history is provided by the patient.  Allergic Reaction Presenting symptoms: rash     Past Medical History:  Diagnosis Date  . Depression   . GERD (gastroesophageal reflux disease)   . Seasonal allergies     Patient Active Problem List   Diagnosis Date Noted  . Poor dentition 06/23/2018  . Tobacco use disorder 06/23/2018  . Depression with anxiety 06/23/2018  . 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 Porokeratosis 01/13/2018    Past Surgical History:  Procedure Laterality Date  . HERNIA REPAIR  10/16/2012   incarcerated  . INGUINAL HERNIA REPAIR Left 10/16/2012   Procedure: HERNIA REPAIR INGUINAL ADULT;  Surgeon: Axel FillerArmando Ramirez, MD;  Location: [facility] OR;  Service: General;  Laterality: Left;        Home Medications    Prior to Admission medications   Medication Sig Start Date End Date Taking? Authorizing Provider  diphenhydrAMINE (BENADRYL) 25 MG tablet Take 25 mg by mouth every 6 (six) hours as needed for itching or allergies.    [provider]  escitalopram (LEXAPRO) 20 MG tablet Take 1 tablet (20 mg total) by mouth daily. 06/23/18   Loletta SpecterGomez, Roger David, PA-C  gabapentin (NEURONTIN) 300 MG capsule Take 2 capsules (600 mg total) by  mouth 3 (three) times daily. 06/23/18   Loletta SpecterGomez, Roger David, PA-C  vitamin B-12 (CYANOCOBALAMIN) 100 MCG tablet Take 100 mcg by mouth daily.     [provider]    Family History No family history on file.  Social History Social History   Tobacco Use  . Smoking status: Former Smoker    Packs/day: 1.00    Years: 30.00    Pack years: 30.00    Types: Cigarettes    Quit date: 02/20/2018    Years since quitting: 1.0  . Smokeless tobacco: Never Used  . Tobacco comment: no cigarette in 14 days   Substance Use Topics  . Alcohol use: No  . Drug use: Yes    Types: Marijuana     Allergies   Patient has no known allergies.   Review of Systems Review of Systems  Constitutional: Negative for fever.  HENT: Negative for sore throat.   Eyes: Negative for redness.  Respiratory: Negative for cough and shortness of breath.   Cardiovascular: Negative for chest pain.  Gastrointestinal: Negative for abdominal pain.  Genitourinary: Negative for flank pain.  Musculoskeletal: Negative for back pain and neck pain.  Skin: Positive for rash.  Neurological: Negative for headaches.  Hematological: Does not bruise/bleed easily.  Psychiatric/Behavioral: Negative for confusion.     Physical Exam Updated Vital Signs BP 113/73 (BP Location: Left Arm)   Pulse (!) 56   Temp 98 F (36.7 C) (Oral)  Resp 18   Ht 1.88 m (6\' 2" )   Wt 80.8 kg   SpO2 98%   BMI 22.87 kg/m   Physical Exam Vitals signs and nursing note reviewed.  Constitutional:      Appearance: Normal appearance. He is well-developed.  HENT:     Head:     Comments: Localized area swelling left forehead c/w bee/hornet sting. No fb or stinger present.     Nose: Nose normal.     Mouth/Throat:     Mouth: Mucous membranes are moist.     Pharynx: Oropharynx is clear.  Eyes:     General: No scleral icterus.    Conjunctiva/sclera: Conjunctivae normal.     Pupils: Pupils are equal, round, and reactive to light.  Neck:      Musculoskeletal: Normal range of motion and neck supple. No neck rigidity.     Trachea: No tracheal deviation.  Cardiovascular:     Rate and Rhythm: Normal rate and regular rhythm.     Pulses: Normal pulses.     Heart sounds: Normal heart sounds. No murmur. No friction rub. No gallop.   Pulmonary:     Effort: Pulmonary effort is normal. No accessory muscle usage or respiratory distress.     Breath sounds: Normal breath sounds. No stridor. No wheezing.  Abdominal:     General: Bowel sounds are normal. There is no distension.     Palpations: Abdomen is soft.     Tenderness: There is no abdominal tenderness. There is no guarding.  Genitourinary:    Comments: No cva tenderness. Musculoskeletal:        General: No swelling.  Skin:    General: Skin is warm and dry.     Findings: No rash.     Comments: Diffuse hives.   Neurological:     Mental Status: He is alert.     Comments: Alert, speech clear.   Psychiatric:        Mood and Affect: Mood normal.      ED Treatments / Results  Labs (all labs ordered are listed, but only abnormal results are displayed) Labs Reviewed - No data to display  EKG None  Radiology No results found.  Procedures Procedures (including critical care time)  Medications Ordered in ED Medications  methylPREDNISolone sodium succinate (SOLU-MEDROL) 125 mg/2 mL injection 125 mg (has no administration in time range)  famotidine (PEPCID) IVPB 20 mg premix (has no administration in time range)     Initial Impression / Assessment and Plan / ED Course  I have reviewed the triage vital signs and the nursing notes.  Pertinent labs & imaging results that were available during my care of the patient were reviewed by me and considered in my medical decision making (see chart for details).  Patient presents with diffuse hives, itching, swelling, post hornet sting to face/head area.   Benadryl 50 mg by EMS.  Iv NS. Continuous pulse ox and monitor. o2 [place].    Stat solumedrol iv. pepcid iv.   Reviewed nursing notes and prior charts for additional history.   Multiple rechecks, pt feels improved.  No wheezing. Pharynx normal. No faintness or lightheadedness.  Pt is noted to be bradycardic - pt notes long history of same, states this is normal for him. No chest pain or discomfort. No sob or unusual doe. No faintness or dizziness. On review of chart and ecgs, prior hr in 40's. No bblocker use.   Patient currently appears stable for d/c.   Rec  benadryl q 6 hrs for 24 hrs. Will provide rx epipen as well in event future severe allergic rxn.  CRITICAL CARE RE: acute/severe reaction to bee/hornet sting, anaphylaxis.  Performed by: Mirna Mires Total critical care time: 35 minutes Critical care time was exclusive of separately billable procedures and treating other patients. Critical care was necessary to treat or prevent imminent or life-threatening deterioration. Critical care was time spent personally by me on the following activities: development of treatment plan with patient and/or surrogate as well as nursing, discussions with consultants, evaluation of patient's response to treatment, examination of patient, obtaining history from patient or surrogate, ordering and performing treatments and interventions, ordering and review of laboratory studies, ordering and review of radiographic studies, pulse oximetry and re-evaluation of patient's condition.   Final Clinical Impressions(s) / ED Diagnoses   Final diagnoses:  None    ED Discharge Orders    None       Lajean Saver, MD 03/14/19 1624

## 2019-03-15 MED FILL — ESCITALOPRAM 20 MG TABLET: 20 | 30 days supply | Qty: 30 | Fill #5

## 2019-03-16 MED FILL — ?EPINOPHRINE 0.3MG AUTO-INJ: 0.3 | 2 days supply | Qty: 2 | Fill #0

## 2019-04-12 ENCOUNTER — Encounter (INDEPENDENT_AMBULATORY_CARE_PROVIDER_SITE_OTHER): Payer: Self-pay | Admitting: Primary Care

## 2019-04-12 ENCOUNTER — Ambulatory Visit (INDEPENDENT_AMBULATORY_CARE_PROVIDER_SITE_OTHER): Payer: Self-pay | Admitting: Primary Care

## 2019-04-12 ENCOUNTER — Other Ambulatory Visit: Payer: Self-pay

## 2019-04-12 VITALS — BP 124/82 | HR 54 | Temp 97.6°F | Ht 74.0 in | Wt 161.8 lb

## 2019-04-12 DIAGNOSIS — A530 Latent syphilis, unspecified as early or late: Secondary | ICD-10-CM

## 2019-04-12 DIAGNOSIS — M5441 Lumbago with sciatica, right side: Secondary | ICD-10-CM

## 2019-04-12 DIAGNOSIS — Z114 Encounter for screening for human immunodeficiency virus [HIV]: Secondary | ICD-10-CM

## 2019-04-12 DIAGNOSIS — Z139 Encounter for screening, unspecified: Secondary | ICD-10-CM

## 2019-04-12 DIAGNOSIS — G8929 Other chronic pain: Secondary | ICD-10-CM

## 2019-04-12 DIAGNOSIS — F17200 Nicotine dependence, unspecified, uncomplicated: Secondary | ICD-10-CM

## 2019-04-12 DIAGNOSIS — M5442 Lumbago with sciatica, left side: Secondary | ICD-10-CM

## 2019-04-12 DIAGNOSIS — F418 Other specified anxiety disorders: Secondary | ICD-10-CM

## 2019-04-12 DIAGNOSIS — Z09 Encounter for follow-up examination after completed treatment for conditions other than malignant neoplasm: Secondary | ICD-10-CM

## 2019-04-12 MED ORDER — GABAPENTIN 300 MG PO CAPS: 600.0000 mg | ORAL_CAPSULE | Freq: Three times a day (TID) | ORAL | 0 refills | Status: DC

## 2019-04-12 MED ORDER — ESCITALOPRAM OXALATE 20 MG PO TABS: 20.0000 mg | ORAL_TABLET | Freq: Every day | ORAL | 3 refills | Status: DC

## 2019-04-12 MED FILL — GABAPENTIN 300 MG CAPSULE: 300 | 30 days supply | Qty: 180 | Fill #0

## 2019-04-12 MED FILL — ESCITALOPRAM 20 MG TABLET: 20 | 30 days supply | Qty: 30 | Fill #0

## 2019-04-12 NOTE — Patient Instructions (Signed)
Bradycardia, Adult °Bradycardia is a slower-than-normal heartbeat. A normal resting heart rate for an adult ranges from 60 to 100 beats per minute. With bradycardia, the resting heart rate is less than 60 beats per minute. °Bradycardia can prevent enough oxygen from reaching certain areas of your body when you are active. It can be serious if it keeps enough oxygen from reaching your brain and other parts of your body. Bradycardia is not a problem for everyone. For some healthy adults, a slow resting heart rate is normal. °What are the causes? °This condition may be caused by: °· A problem with the heart, including: °? A problem with the heart's electrical system, such as a heart block. With a heart block, electrical signals between the chambers of the heart are partially or completely blocked, so they are not able to work as they should. °? A problem with the heart's natural pacemaker (sinus node). °? Heart disease. °? A heart attack. °? Heart damage. °? Lyme disease. °? A heart infection. °? A heart condition that is present at birth (congenital heart defect). °· Certain medicines that treat heart conditions. °· Certain conditions, such as hypothyroidism and obstructive sleep apnea. °· Problems with the balance of chemicals and other substances, like potassium, in the blood. °· Trauma. °· Radiation therapy. °What increases the risk? °You are more likely to develop this condition if you: °· Are age 58 or older. °· Have high blood pressure (hypertension), high cholesterol (hyperlipidemia), or diabetes. °· Drink heavily, use tobacco or nicotine products, or use drugs. °What are the signs or symptoms? °Symptoms of this condition include: °· Light-headedness. °· Feeling faint or fainting. °· Fatigue and weakness. °· Trouble with activity or exercise. °· Shortness of breath. °· Chest pain (angina). °· Drowsiness. °· Confusion. °· Dizziness. °How is this diagnosed? °This condition may be diagnosed based on: °· Your  symptoms. °· Your medical history. °· A physical exam. °During the exam, your health care provider will listen to your heartbeat and check your pulse. To confirm the diagnosis, your health care provider may order tests, such as: °· Blood tests. °· An electrocardiogram (ECG). This test records the heart's electrical activity. The test can show how fast your heart is beating and whether the heartbeat is steady. °· A test in which you wear a portable device (event recorder or Holter monitor) to record your heart's electrical activity while you go about your day. °· An exercise test. °How is this treated? °Treatment for this condition depends on the cause of the condition and how severe your symptoms are. Treatment may involve: °· Treatment of the underlying condition. °· Changing your medicines or how much medicine you take. °· Having a small, battery-operated device called a pacemaker implanted under the skin. When bradycardia occurs, this device can be used to increase your heart rate and help your heart beat in a regular rhythm. °Follow these instructions at home: °Lifestyle ° °· Manage any health conditions that contribute to bradycardia as told by your health care provider. °· Follow a heart-healthy diet. A nutrition specialist (dietitian) can help educate you about healthy food options and changes. °· Follow an exercise program that is approved by your health care provider. °· Maintain a healthy weight. °· Try to reduce or manage your stress, such as with yoga or meditation. If you need help reducing stress, ask your health care provider. °· Do not use any products that contain nicotine or tobacco, such as cigarettes, e-cigarettes, and chewing tobacco. If you need help   quitting, ask your health care provider. °· Do not use illegal drugs. °· Limit alcohol intake to no more than 1 drink a day for nonpregnant women and 2 drinks a day for men. Be aware of how much alcohol is in your drink. In the [place] one drink  equals one 12 oz bottle of beer (355 mL), one 5 oz glass of wine (148 mL), or one 1½ oz glass of hard liquor (44 mL). °General instructions °· Take over-the-counter and prescription medicines only as told by your health care provider. °· Keep all follow-up visits as told by your health care provider. This is important. °How is this prevented? °In some cases, bradycardia may be prevented by: °· Treating underlying medical problems. °· Stopping behaviors or medicines that can trigger the condition. °Contact a health care provider if you: °· Feel light-headed or dizzy. °· Almost faint. °· Feel weak or are easily fatigued during physical activity. °· Experience confusion or have memory problems. °Get help right away if: °· You faint. °· You have: °? An irregular heartbeat (palpitations). °? Chest pain. °? Trouble breathing. °Summary °· Bradycardia is a slower-than-normal heartbeat. With bradycardia, the resting heart rate is less than 60 beats per minute. °· Treatment for this condition depends on the cause. °· Manage any health conditions that contribute to bradycardia as told by your health care provider. °· Do not use any products that contain nicotine or tobacco, such as cigarettes, e-cigarettes, and chewing tobacco, and limit alcohol intake. °· Keep all follow-up visits as told by your health care provider. This is important. °This information is not intended to replace advice given to you by your health care provider. Make sure you discuss any questions you have with your health care provider. °Document Released: 04/17/2002 Document Revised: 02/06/2018 Document Reviewed: 01/04/2018 °Elsevier Patient Education © 2020 [employer] ° °

## 2019-04-12 NOTE — Progress Notes (Signed)
Established Patient Office Visit  Subjective:  Patient ID: Logan Bailey, male    DOB: 1961/04/10  Age: 58 y.o. MRN: [id number]  CC:  Chief Complaint  Patient presents with  . Hospitalization Follow-up    allergic reastion to a bee sting  . Medication Refill  . Low Heart Rate    HPI Logan Bailey presents for complaints of pain on the bottom of his feet. 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 Recently discharged from emergency room for a allergic reaction stung by a Bee.  Past Medical History:  Diagnosis Date  . Depression   . GERD (gastroesophageal reflux disease)   . Seasonal allergies     Past Surgical History:  Procedure Laterality Date  . HERNIA REPAIR  10/16/2012   incarcerated  . INGUINAL HERNIA REPAIR Left 10/16/2012   Procedure: HERNIA REPAIR INGUINAL ADULT;  Surgeon: Ralene Ok, MD;  Location: [facility];  Service: General;  Laterality: Left;    History reviewed. No pertinent family history.  Social History   Socioeconomic History  . Marital status: Divorced    Spouse name: Not on file  . Number of children: Not on file  . Years of education: Not on file  . Highest education level: Not on file  Occupational History  . Not on file  Social Needs  . Financial resource strain: Not on file  . Food insecurity    Worry: Not on file    Inability: Not on file  . Transportation needs    Medical: Not on file    Non-medical: Not on file  Tobacco Use  . Smoking status: Former Smoker    Packs/day: 1.00    Years: 30.00    Pack years: 30.00    Types: Cigarettes    Quit date: 02/20/2018    Years since quitting: 1.1  . Smokeless tobacco: Never Used  . Tobacco comment: no cigarette in 14 days   Substance and Sexual Activity  . Alcohol use: No  . Drug use: Yes    Types: Marijuana  . Sexual activity: Not on file  Lifestyle  . Physical activity    Days per week: Not on file    Minutes per session: Not on file  . Stress: Not on file  Relationships  .  Social Herbalist on phone: Not on file    Gets together: Not on file    Attends religious service: Not on file    Active member of club or organization: Not on file    Attends meetings of clubs or organizations: Not on file    Relationship status: Not on file  . Intimate partner violence    Fear of current or ex partner: Not on file    Emotionally abused: Not on file    Physically abused: Not on file    Forced sexual activity: Not on file  Other Topics Concern  . Not on file  Social History Narrative  . Not on file    Outpatient Medications Prior to Visit  Medication Sig Dispense Refill  . diphenhydrAMINE (BENADRYL) 25 MG tablet Take 25 mg by mouth every 6 (six) hours as needed for itching or allergies.    Marland Kitchen EPINEPHrine (EPIPEN 2-PAK) 0.3 mg/0.3 mL IJ SOAJ injection Inject 0.3 mLs (0.3 mg total) into the muscle as needed for anaphylaxis. Use as directed, in event of severe allergic reaction 1 each 0  . ibuprofen (ADVIL) 200 MG tablet Take 400 mg by mouth every  6 (six) hours as needed for moderate pain.    . vitamin B-12 (CYANOCOBALAMIN) 100 MCG tablet Take 200 mcg by mouth daily.     Marland Kitchen escitalopram (LEXAPRO) 20 MG tablet Take 1 tablet (20 mg total) by mouth daily. 30 tablet 5  . gabapentin (NEURONTIN) 300 MG capsule Take 2 capsules (600 mg total) by mouth 3 (three) times daily. 180 capsule 5   No facility-administered medications prior to visit.     No Known Allergies  ROS Review of Systems  Respiratory: Positive for cough and shortness of breath.       Objective:    Physical Exam  Constitutional: He is oriented to person, place, and time.  Thin frame  HENT:  Head: Normocephalic.  Eyes: Pupils are equal, round, and reactive to light. EOM are normal.  Neck: Normal range of motion.  Cardiovascular: Normal rate and regular rhythm.  Abdominal: Soft. Bowel sounds are normal.  Musculoskeletal: Normal range of motion.  Neurological: He is alert and oriented to  person, place, and time.  Skin: Skin is warm and dry.  Psychiatric: He has a normal mood and affect.    BP 124/82 (BP Location: Left Arm, Patient Position: Sitting, Cuff Size: Normal)   Pulse (!) 54   Temp 97.6 F (36.4 C) (Oral)   Ht _0  (1.88 m)   Wt 161 lb 12.8 oz (73.4 kg)   SpO2 100%   BMI 20.77 kg/m  Wt Readings from Last 3 Encounters:  04/12/19 161 lb 12.8 oz (73.4 kg)  03/14/19 178 lb 2.1 oz (80.8 kg)  06/23/18 178 lb 3.2 oz (80.8 kg)     Health Maintenance Due  Topic Date Due  . INFLUENZA VACCINE  03/10/2019    There are no preventive care reminders to display for this patient.  Lab Results  Component Value Date   TSH 2.900 08/16/2017   Lab Results  Component Value Date   WBC 4.2 04/12/2019   HGB 13.1 04/12/2019   HCT 40.1 04/12/2019   MCV 92 04/12/2019   PLT 264 04/12/2019   Lab Results  Component Value Date   NA 144 04/12/2019   K 4.1 04/12/2019   CO2 25 04/12/2019   GLUCOSE 87 04/12/2019   BUN 11 04/12/2019   CREATININE 0.93 04/12/2019   BILITOT 0.3 04/12/2019   ALKPHOS 52 04/12/2019   AST 14 04/12/2019   ALT 13 04/12/2019   PROT 6.2 04/12/2019   ALBUMIN 4.3 04/12/2019   CALCIUM 9.2 04/12/2019   ANIONGAP 6 07/30/2017   Lab Results  Component Value Date   CHOL 156 04/12/2019   Lab Results  Component Value Date   HDL 49 04/12/2019   No results found for: [facility] Lab Results  Component Value Date   TRIG 43 04/12/2019   Lab Results  Component Value Date   CHOLHDL 3.2 04/12/2019   No results found for: HGBA1C    Assessment & Plan:   Problem List Items Addressed This Visit    Tobacco use disorder - Primary   Depression with anxiety   Relevant Medications   escitalopram (LEXAPRO) 20 MG tablet   Chronic bilateral low back pain with bilateral sciatica   Relevant Medications   escitalopram (LEXAPRO) 20 MG tablet   gabapentin (NEURONTIN) 300 MG capsule    Other Visit Diagnoses    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  Diagnoses and all orders for this visit:  Hospital Discharge follow up Bee sting to forehead acute onset, mod-severe, constant, persistent. Developed diffuse urticaria/hives, itching all over, swelling about face. Treated with IV methylprednisone 184m IV and Famotidine 237mdischarge to home.   Tobacco use disorder Nicotine affect every organ in the body second leading cause of death.  Increased risk for lung cancer and other respiratory diseases recommend cessation.  This will be reminded at each clinical visit.  Depression with anxiety Depression is when you feel down, blue or sad for at least 2 weeks in a row. You may experience increaset increase in sleeping, eating or  loss of interest in doing things that once gave you pleasure. Feeling worthless, guilty, nervous and low self esteem, avoiding interaction with other people or increase agitation. -     escitalopram (LEXAPRO) 20 MG tablet; Take 1 tablet (20 mg total) by mouth daily.  Chronic bilateral low back pain with bilateral sciatica  May alternate with heat and ice application for pain relief. May also alternate with acetaminophen and Ibuprofen as prescribed pain relief. Other alternatives include massage, acupuncture and water aerobics.  You must stay active and avoid a sedentary lifestyle. -     gabapentin (NEURONTIN) 300 MG capsule; Take 2 capsules (600 mg total) by mouth 3 (three) times daily.  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 escitalopram (LEXAPRO) 20 MG tablet    Sig: Take 1 tablet (20 mg total) by mouth daily.    Dispense:  30 tablet    Refill:  3  . gabapentin (NEURONTIN) 300 MG capsule    Sig: Take 2 capsules (600 mg total) by mouth 3 (three) times daily.    Dispense:  180 capsule    Refill:  0    Follow-up: Return in about 3 months (around 07/12/2019) for depression, bradycardia.    MiKerin PernaNP

## 2019-04-13 LAB — CBC WITH DIFFERENTIAL/PLATELET
Basophils Absolute: 0 10*3/uL (ref 0.0–0.2)
Basos: 0 %
EOS (ABSOLUTE): 0 10*3/uL (ref 0.0–0.4)
Eos: 1 %
Hematocrit: 40.1 % (ref 37.5–51.0)
Hemoglobin: 13.1 g/dL (ref 13.0–17.7)
Immature Grans (Abs): 0 10*3/uL (ref 0.0–0.1)
Immature Granulocytes: 0 %
Lymphocytes Absolute: 1.4 10*3/uL (ref 0.7–3.1)
Lymphs: 33 %
MCH: 30 pg (ref 26.6–33.0)
MCHC: 32.7 g/dL (ref 31.5–35.7)
MCV: 92 fL (ref 79–97)
Monocytes Absolute: 0.3 10*3/uL (ref 0.1–0.9)
Monocytes: 8 %
Neutrophils Absolute: 2.5 10*3/uL (ref 1.4–7.0)
Neutrophils: 58 %
Platelets: 264 10*3/uL (ref 150–450)
RBC: 4.37 x10E6/uL (ref 4.14–5.80)
RDW: 13.3 % (ref 11.6–15.4)
WBC: 4.2 10*3/uL (ref 3.4–10.8)

## 2019-04-13 LAB — CMP14+EGFR
ALT: 13 IU/L (ref 0–44)
AST: 14 IU/L (ref 0–40)
Albumin/Globulin Ratio: 2.3 — ABNORMAL HIGH (ref 1.2–2.2)
Albumin: 4.3 g/dL (ref 3.8–4.9)
Alkaline Phosphatase: 52 IU/L (ref 39–117)
BUN/Creatinine Ratio: 12 (ref 9–20)
BUN: 11 mg/dL (ref 6–24)
Bilirubin Total: 0.3 mg/dL (ref 0.0–1.2)
CO2: 25 mmol/L (ref 20–29)
Calcium: 9.2 mg/dL (ref 8.7–10.2)
Chloride: 106 mmol/L (ref 96–106)
Creatinine, Ser: 0.93 mg/dL (ref 0.76–1.27)
GFR calc Af Amer: 105 mL/min/{1.73_m2} (ref >59)
GFR calc non Af Amer: 91 mL/min/{1.73_m2} (ref >59)
Globulin, Total: 1.9 g/dL (ref 1.5–4.5)
Glucose: 87 mg/dL (ref 65–99)
Potassium: 4.1 mmol/L (ref 3.5–5.2)
Sodium: 144 mmol/L (ref 134–144)
Total Protein: 6.2 g/dL (ref 6.0–8.5)

## 2019-04-13 LAB — LIPID PANEL
Chol/HDL Ratio: 3.2 ratio (ref 0.0–5.0)
Cholesterol, Total: 156 mg/dL (ref 100–199)
HDL: 49 mg/dL (ref >39)
LDL Chol Calc (NIH): 98 mg/dL (ref 0–99)
Triglycerides: 43 mg/dL (ref 0–149)
VLDL Cholesterol Cal: 9 mg/dL (ref 5–40)

## 2019-04-13 LAB — RPR: RPR Ser Ql: NONREACTIVE

## 2019-04-13 LAB — HIV ANTIBODY (ROUTINE TESTING W REFLEX): HIV Screen 4th Generation wRfx: NONREACTIVE

## 2019-04-18 ENCOUNTER — Telehealth (INDEPENDENT_AMBULATORY_CARE_PROVIDER_SITE_OTHER): Payer: Self-pay

## 2019-04-18 NOTE — Telephone Encounter (Signed)
Left voicemail notifying patient that labs were normal. Return call to [facility] at [phone] with any questions or concerns. Nat Christen, CMA

## 2019-04-18 NOTE — Telephone Encounter (Signed)
-----   Message from Michelle P Edwards, NP sent at 04/13/2019  9:56 AM EDT ----- I have reviewed all labs and they are normal/unremarkable. Please notify patient of the results. Have them schedule appointment if there are any other concerns or questions. Thank you. 

## 2019-06-05 MED FILL — GABAPENTIN 300 MG CAPSULE: 300 | 30 days supply | Qty: 180 | Fill #5

## 2019-06-05 MED FILL — ESCITALOPRAM 20 MG TABLET: 20 | 30 days supply | Qty: 30 | Fill #1

## 2019-07-12 ENCOUNTER — Ambulatory Visit (INDEPENDENT_AMBULATORY_CARE_PROVIDER_SITE_OTHER): Payer: Medicaid Other | Admitting: Primary Care

## 2019-07-16 MED FILL — ESCITALOPRAM 20 MG TABLET: 20 | 30 days supply | Qty: 30 | Fill #2

## 2019-07-17 ENCOUNTER — Other Ambulatory Visit (INDEPENDENT_AMBULATORY_CARE_PROVIDER_SITE_OTHER): Payer: Self-pay | Admitting: Primary Care

## 2019-07-17 DIAGNOSIS — M5441 Lumbago with sciatica, right side: Secondary | ICD-10-CM

## 2019-07-17 DIAGNOSIS — M5442 Lumbago with sciatica, left side: Secondary | ICD-10-CM

## 2019-07-17 MED FILL — GABAPENTIN 300 MG CAPSULE: 300 | 30 days supply | Qty: 180 | Fill #0

## 2019-07-25 ENCOUNTER — Encounter (INDEPENDENT_AMBULATORY_CARE_PROVIDER_SITE_OTHER): Payer: Self-pay | Admitting: Primary Care

## 2019-07-25 ENCOUNTER — Ambulatory Visit (INDEPENDENT_AMBULATORY_CARE_PROVIDER_SITE_OTHER): Payer: Self-pay | Admitting: Primary Care

## 2019-07-25 ENCOUNTER — Other Ambulatory Visit: Payer: Self-pay

## 2019-07-25 DIAGNOSIS — F17210 Nicotine dependence, cigarettes, uncomplicated: Secondary | ICD-10-CM

## 2019-07-25 DIAGNOSIS — M5442 Lumbago with sciatica, left side: Secondary | ICD-10-CM

## 2019-07-25 DIAGNOSIS — Q828 Other specified congenital malformations of skin: Secondary | ICD-10-CM

## 2019-07-25 DIAGNOSIS — M5441 Lumbago with sciatica, right side: Secondary | ICD-10-CM

## 2019-07-25 DIAGNOSIS — F418 Other specified anxiety disorders: Secondary | ICD-10-CM

## 2019-07-25 DIAGNOSIS — G8929 Other chronic pain: Secondary | ICD-10-CM

## 2019-07-25 MED ORDER — GABAPENTIN 300 MG PO CAPS: 600.0000 mg | ORAL_CAPSULE | Freq: Three times a day (TID) | ORAL | 1 refills | Status: DC

## 2019-07-25 MED ORDER — NICOTINE 21 MG/24HR TD PT24: 21.0000 mg | MEDICATED_PATCH | Freq: Every day | TRANSDERMAL | 0 refills | Status: DC

## 2019-07-25 MED ORDER — ESCITALOPRAM OXALATE 20 MG PO TABS: 20.0000 mg | ORAL_TABLET | Freq: Every day | ORAL | 1 refills | Status: DC

## 2019-07-25 MED ORDER — NICOTINE 14 MG/24HR TD PT24: 14.0000 mg | MEDICATED_PATCH | Freq: Every day | TRANSDERMAL | 0 refills | Status: DC

## 2019-07-25 MED ORDER — NICOTINE 7 MG/24HR TD PT24: 7.0000 mg | MEDICATED_PATCH | Freq: Every day | TRANSDERMAL | 0 refills | Status: DC

## 2019-07-25 MED FILL — NICOTINE 21 MG/24HR PATCH: 21 | 28 days supply | Qty: 28 | Fill #0

## 2019-07-25 NOTE — Progress Notes (Signed)
Virtual Visit via Telephone Note  I connected with [name] on 07/25/19 at 11:10 AM EST by telephone and verified that I am speaking with the correct person using two identifiers.   I discussed the limitations, risks, security and privacy concerns of performing an evaluation and management service by telephone and the availability of in person appointments. I also discussed with the patient that there may be a patient responsible charge related to this service. The patient expressed understanding and agreed to proceed.   History of Present Illness: Mr. Logan Bailey is having a tele visit initially for depression and bradycardia. However, he was concern about his feet bilateral pain previously followed by a podiatrist. Patient stated he needed to have his financial papers brought to the officer to be re certified. Explained would refer to podiatry but he would have to pay out of pocket.  Past Medical History:  Diagnosis Date  . Depression   . GERD (gastroesophageal reflux disease)   . Seasonal allergies    Current Outpatient Medications on File Prior to Visit  Medication Sig Dispense Refill  . diphenhydrAMINE (BENADRYL) 25 MG tablet Take 25 mg by mouth every 6 (six) hours as needed for itching or allergies.    . vitamin B-12 (CYANOCOBALAMIN) 100 MCG tablet Take 200 mcg by mouth daily.     Marland Kitchen EPINEPHrine (EPIPEN 2-PAK) 0.3 mg/0.3 mL IJ SOAJ injection Inject 0.3 mLs (0.3 mg total) into the muscle as needed for anaphylaxis. Use as directed, in event of severe allergic reaction 1 each 0   No current facility-administered medications on file prior to visit.     Observations/Objective: Review of Systems  Musculoskeletal:       Bilateral foot pain  Neurological: Positive for weakness.  All other systems reviewed and are negative.   Assessment and Plan: [name] was seen today for depression and bradycardia.  Diagnoses and all orders for this visit:  Porokeratosis painful  both  feet which are aggravated with weightbearing  -     gabapentin (NEURONTIN) 300 MG capsule; Take 2 capsules (600 mg total) by mouth 3 (three) times daily Refer to podiatry   Depression with anxiety Depression has increase with difficulty walking and pain being in the house .  Requesting refills on  - escitalopram (LEXAPRO) 20 MG tablet; Take 1 tablet (20 mg total) by mouth daily.  Chronic bilateral low back pain with bilateral sciatica -     gabapentin (NEURONTIN) 300 MG capsule; Take 2 capsules (600 mg total) by mouth 3 (three) times daily.  Tobacco dependence due to cigarettes Patient is aware of  increased risk for lung cancer and decided today to start cessation with the aid of nicoderm.    Other orders -     nicotine (NICODERM CQ) 21 mg/24hr patch; Place 1 patch (21 mg total) onto the skin daily. -     nicotine (NICODERM CQ) 14 mg/24hr patch; Place 1 patch (14 mg total) onto the skin daily. -     nicotine (NICODERM CQ) 7 mg/24hr patch; Place 1 patch (7 mg total) onto the skin daily.     Follow Up Instructions:    I discussed the assessment and treatment plan with the patient. The patient was provided an opportunity to ask questions and all were answered. The patient agreed with the plan and demonstrated an understanding of the instructions.   The patient was advised to call back or seek an in-person evaluation if the symptoms worsen or if the condition  fails to improve as anticipated.  I provided 18 minutes of non-face-to-face time during this encounter.   Kerin Perna, NP

## 2019-07-25 NOTE — Progress Notes (Signed)
Foot pain- a lot of pressure

## 2019-08-28 MED FILL — ESCITALOPRAM 20 MG TABLET: 20 | 30 days supply | Qty: 30 | Fill #0

## 2019-08-28 MED FILL — GABAPENTIN 300 MG CAPSULE: 300 | 30 days supply | Qty: 180 | Fill #0

## 2019-10-09 MED FILL — GABAPENTIN 300 MG CAPSULE: 300 | 30 days supply | Qty: 180 | Fill #1

## 2019-10-19 ENCOUNTER — Ambulatory Visit: Payer: Medicaid Other | Admitting: Podiatry

## 2019-10-19 ENCOUNTER — Other Ambulatory Visit: Payer: Self-pay

## 2019-11-19 ENCOUNTER — Other Ambulatory Visit (INDEPENDENT_AMBULATORY_CARE_PROVIDER_SITE_OTHER): Payer: Self-pay | Admitting: Primary Care

## 2019-11-19 DIAGNOSIS — M5442 Lumbago with sciatica, left side: Secondary | ICD-10-CM

## 2019-11-19 DIAGNOSIS — G8929 Other chronic pain: Secondary | ICD-10-CM

## 2019-11-19 MED ORDER — GABAPENTIN 300 MG PO CAPS: 600.0000 mg | ORAL_CAPSULE | Freq: Three times a day (TID) | ORAL | 0 refills | Status: DC

## 2019-11-19 MED FILL — ESCITALOPRAM 20 MG TABLET: 20 | 30 days supply | Qty: 30 | Fill #1

## 2019-11-19 NOTE — Telephone Encounter (Signed)
Sent to PCP ?

## 2019-11-22 ENCOUNTER — Other Ambulatory Visit (INDEPENDENT_AMBULATORY_CARE_PROVIDER_SITE_OTHER): Payer: Self-pay | Admitting: Primary Care

## 2019-11-22 DIAGNOSIS — G8929 Other chronic pain: Secondary | ICD-10-CM

## 2019-11-22 MED ORDER — GABAPENTIN 300 MG PO CAPS: 600.0000 mg | ORAL_CAPSULE | Freq: Three times a day (TID) | ORAL | 0 refills | Status: DC

## 2019-11-22 MED FILL — GABAPENTIN 300 MG CAPSULE: 300 | 30 days supply | Qty: 180 | Fill #0

## 2019-11-29 MED FILL — GABAPENTIN 300 MG CAPSULE: 300 | 30 days supply | Qty: 180 | Fill #0

## 2019-11-29 MED FILL — ESCITALOPRAM 20 MG TABLET: 20 | 30 days supply | Qty: 30 | Fill #3

## 2019-12-31 ENCOUNTER — Other Ambulatory Visit: Payer: Self-pay

## 2019-12-31 ENCOUNTER — Encounter: Payer: Self-pay | Admitting: Podiatry

## 2019-12-31 ENCOUNTER — Ambulatory Visit (INDEPENDENT_AMBULATORY_CARE_PROVIDER_SITE_OTHER): Payer: Medicaid Other | Admitting: Podiatry

## 2019-12-31 DIAGNOSIS — B351 Tinea unguium: Secondary | ICD-10-CM

## 2019-12-31 DIAGNOSIS — M79675 Pain in left toe(s): Secondary | ICD-10-CM

## 2019-12-31 DIAGNOSIS — Z3009 Encounter for other general counseling and advice on contraception: Secondary | ICD-10-CM | POA: Diagnosis not present

## 2019-12-31 DIAGNOSIS — M79674 Pain in right toe(s): Secondary | ICD-10-CM | POA: Diagnosis not present

## 2019-12-31 DIAGNOSIS — Z0389 Encounter for observation for other suspected diseases and conditions ruled out: Secondary | ICD-10-CM | POA: Diagnosis not present

## 2019-12-31 DIAGNOSIS — Z1388 Encounter for screening for disorder due to exposure to contaminants: Secondary | ICD-10-CM | POA: Diagnosis not present

## 2019-12-31 NOTE — Patient Instructions (Signed)

## 2020-01-05 NOTE — Progress Notes (Signed)
Subjective: Logan Bailey is a 59 y.o. male patient seen today painful mycotic nails b/l that are difficult to trim. Pain interferes with ambulation. Aggravating factors include wearing enclosed shoe gear. Pain is relieved with periodic professional debridement  Patient Active Problem List   Diagnosis Date Noted  . Poor dentition 06/23/2018  . Tobacco use disorder 06/23/2018  . Depression with anxiety 06/23/2018  . 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 Porokeratosis 01/13/2018    Current Outpatient Medications on File Prior to Visit  Medication Sig Dispense Refill  . diphenhydrAMINE (BENADRYL) 25 MG tablet Take 25 mg by mouth every 6 (six) hours as needed for itching or allergies.    Marland Kitchen EPINEPHrine (EPIPEN 2-PAK) 0.3 mg/0.3 mL IJ SOAJ injection Inject 0.3 mLs (0.3 mg total) into the muscle as needed for anaphylaxis. Use as directed, in event of severe allergic reaction 1 each 0  . escitalopram (LEXAPRO) 20 MG tablet Take 1 tablet (20 mg total) by mouth daily. 90 tablet 1  . gabapentin (NEURONTIN) 300 MG capsule TAKE 2 CAPSULES (600 MG TOTAL) BY MOUTH 3 (THREE) TIMES DAILY. 180 capsule 1  . nicotine (NICODERM CQ) 14 mg/24hr patch Place 1 patch (14 mg total) onto the skin daily. 28 patch 0  . nicotine (NICODERM CQ) 21 mg/24hr patch Place 1 patch (21 mg total) onto the skin daily. 28 patch 0  . nicotine (NICODERM CQ) 7 mg/24hr patch Place 1 patch (7 mg total) onto the skin daily. 28 patch 0  . vitamin B-12 (CYANOCOBALAMIN) 100 MCG tablet Take 200 mcg by mouth daily.      No current facility-administered medications on file prior to visit.    No Known Allergies  Objective: Physical Exam  General: 59 y.o. African American male no acute distress, awake, alert and oriented x 3  Neurovascular status unchanged b/l.  Capillary refill time to digits immediate b/l. Palpable DP pulses b/l. Palpable PT pulses b/l. Pedal hair sparse b/l. Skin temperature gradient  within normal limits b/l. No edema noted b/l. No pain with calf compression b/l.  Protective sensation intact 5/5 intact bilaterally with 10g monofilament b/l. Vibratory sensation intact b/l.  Dermatological:  Pedal skin with normal turgor, texture and tone bilaterally. No open wounds bilaterally. No interdigital macerations bilaterally. Toenails 1-5 b/l elongated, discolored, dystrophic, thickened, crumbly with subungual debris and tenderness to dorsal palpation. Porokeratotic lesion(s) submet head 1 left foot, submet head 1 right foot, submet head 5 left foot and submet head 5 right foot. No erythema, no edema, no drainage, no flocculence.  Musculoskeletal:  Normal muscle strength 5/5 to all lower extremity muscle groups bilaterally. No pain crepitus or joint limitation noted with ROM b/l. Hallux valgus with bunion deformity noted b/l. Utilizes walking stick for ambulation assistance.  Assessment and Plan:  1. Pain due to onychomycosis of toenails of both feet    -Examined patient. -No new findings. No new orders. -Medicaid ABN signed for this year. Copy given to patient on today's visit and copy placed in patient chart. Today, he refuses debridement of porokeratotic lesions. -Toenails 1-5 b/l were debrided in length and girth with sterile nail nippers and dremel without iatrogenic bleeding.  -Patient to continue soft, supportive shoe gear daily. -Patient to report any pedal injuries to medical professional immediately. -Patient/POA to call should there be question/concern in the interim.  Return in about 3 months (around 04/01/2020) for nail and callus trim.  Freddie Breech, DPM

## 2020-01-28 MED FILL — ESCITALOPRAM 20 MG TABLET: 20 | 30 days supply | Qty: 30 | Fill #1

## 2020-01-28 MED FILL — GABAPENTIN 300 MG CAPSULE: 300 | 30 days supply | Qty: 180 | Fill #1

## 2020-02-21 IMAGING — MR MR LUMBAR SPINE W/O CM
4 of 5 series · 18 of 48 positions shown · non-contrast
Comparison: None.

CLINICAL DATA: Chronic back pain, some pain radiating down the legs
bilaterally.

EXAM:
MRI LUMBAR SPINE WITHOUT CONTRAST
TECHNIQUE: Multiplanar, multisequence MR imaging of the lumbar spine was
performed. No intravenous contrast was administered.

[Series 4: T1 · sagittal · 4.0mm · 0.55mm/px · 3 of 15 slices shown (1 of 2)]
[im 3/15]
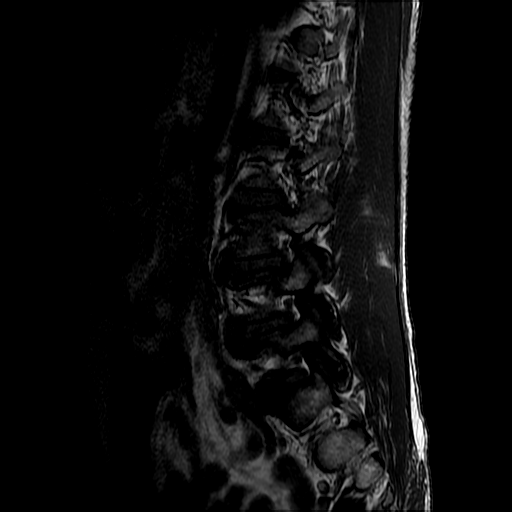
[im 9/15]
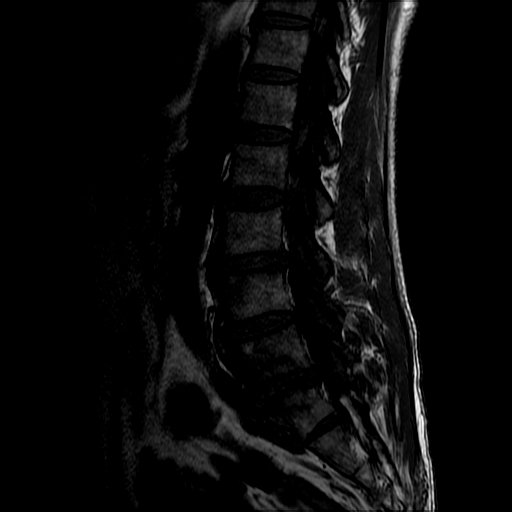
[im 15/15]
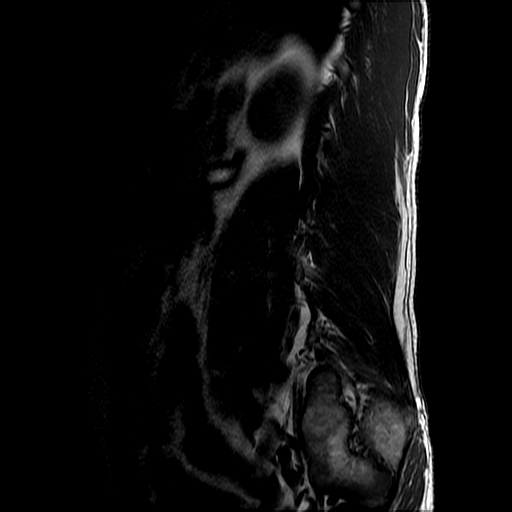

[Series 6: T2 · axial · 4.0mm · 0.39mm/px · z∈[-78,+110]mm · 9 of 38 slices shown (1 of 2)]
[im 1/38]
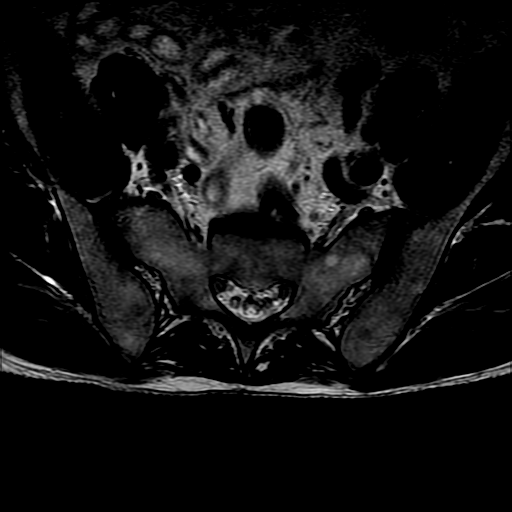
[im 6/38]
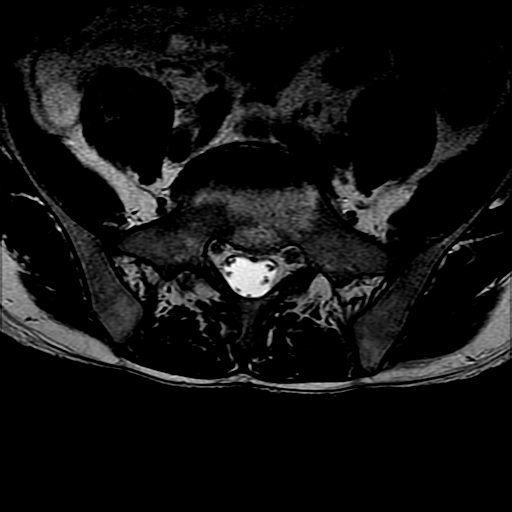
[im 11/38]
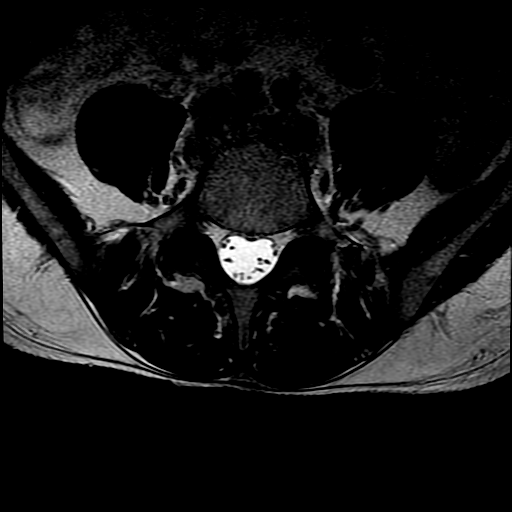
[im 16/38]
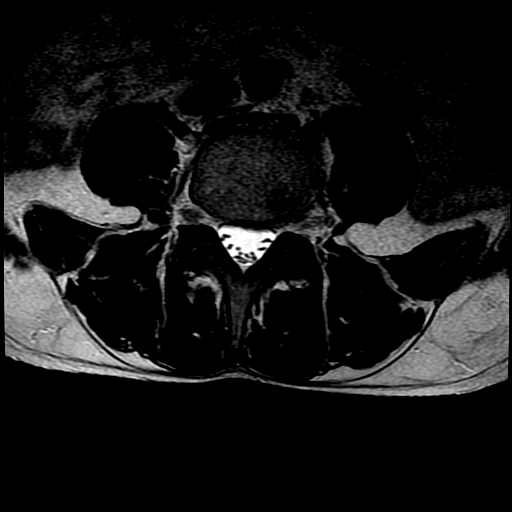
[im 19/38]
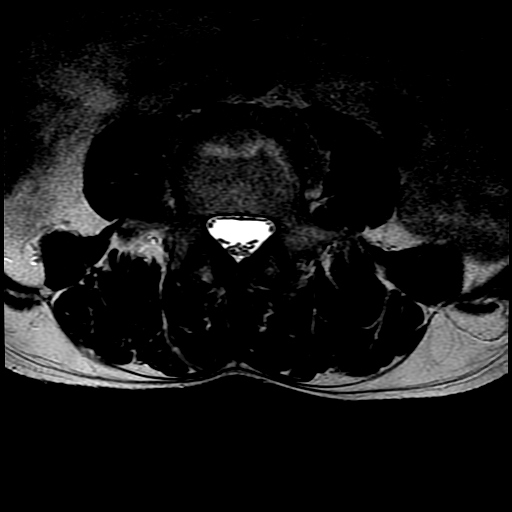
[im 22/38]
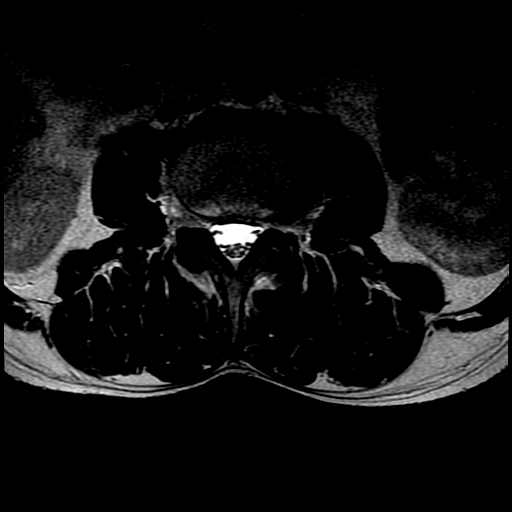
[im 27/38]
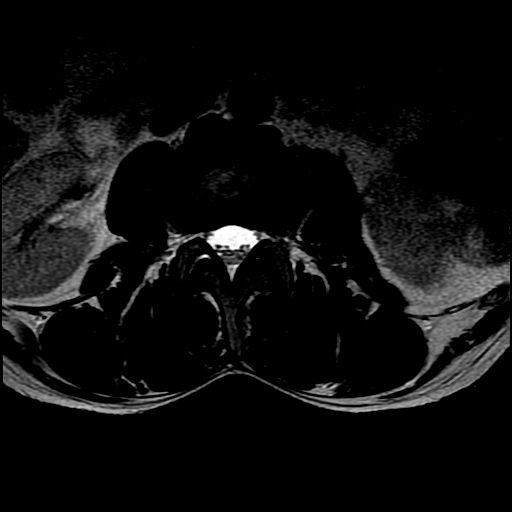
[im 32/38]
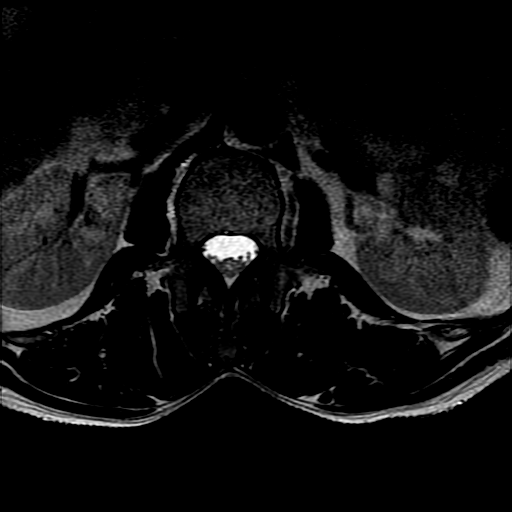
[im 38/38]
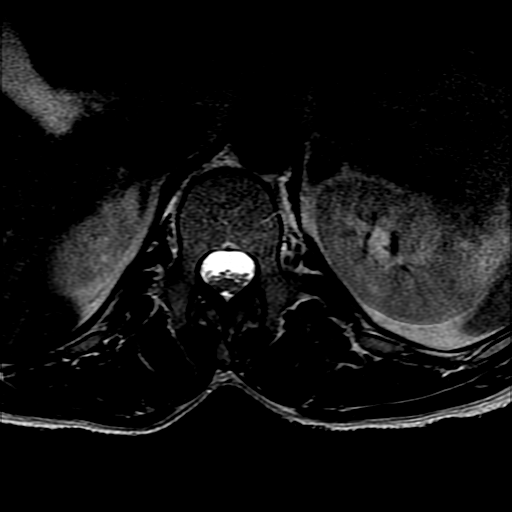

[Series 7: T2 · sagittal · 4.0mm · 0.55mm/px · 3 of 15 slices shown (2 of 2)]
[im 3/15]
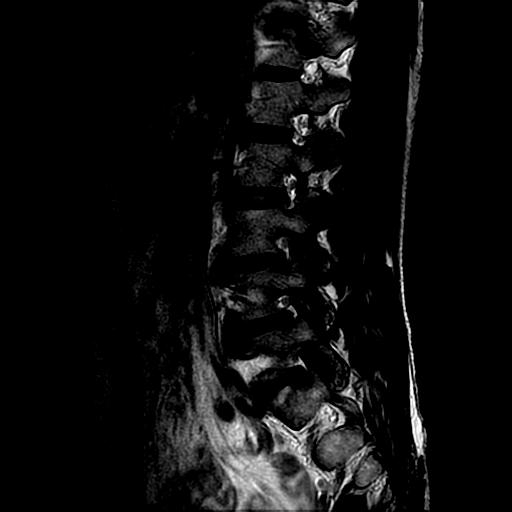
[im 9/15]
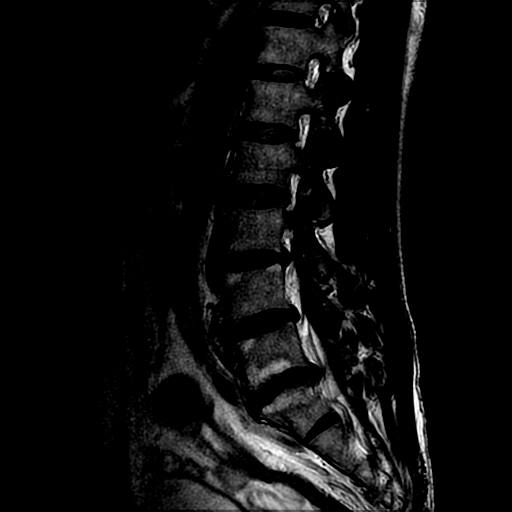
[im 15/15]
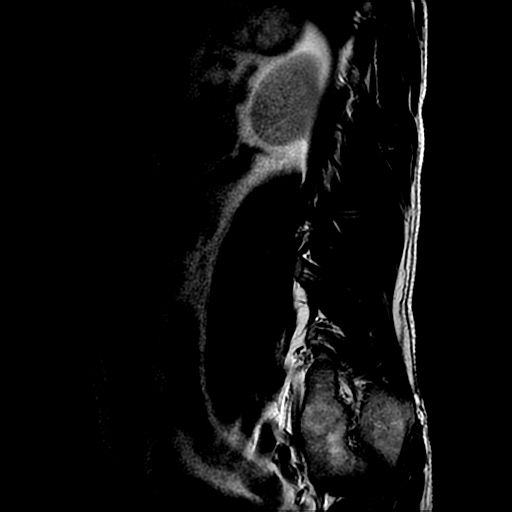

[Series 8: T1 · axial · 4.0mm · 0.39mm/px · z∈[-53,+80]mm · 3 of 38 slices shown (2 of 2)]
[im 6/38]
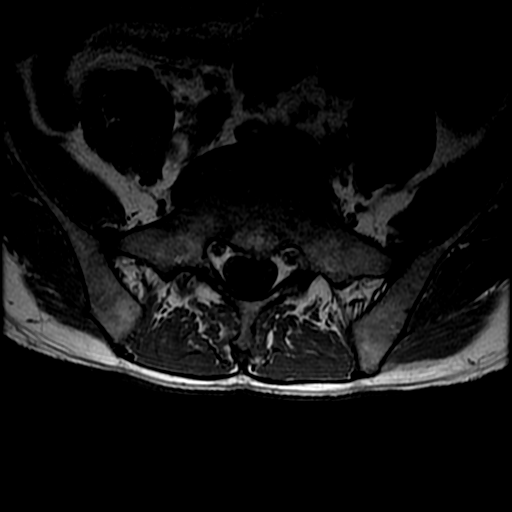
[im 19/38]
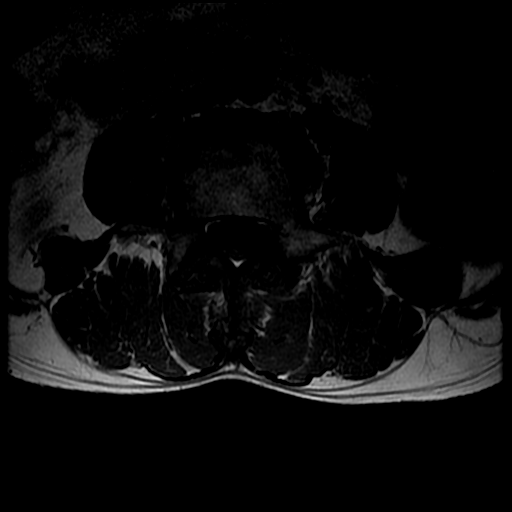
[im 32/38]
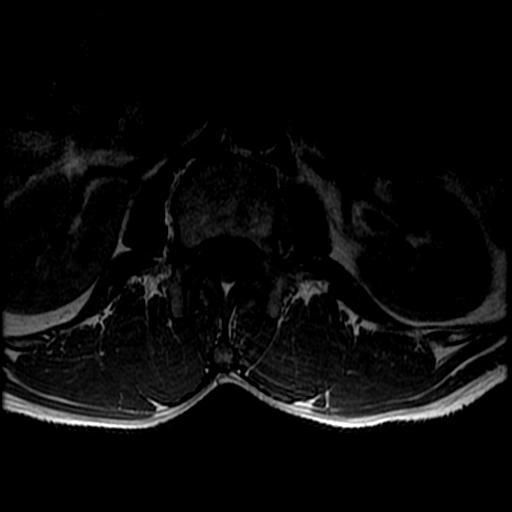

[18 of 48 positions shown; findings below may reference images not displayed]

FINDINGS: Segmentation:  Standard.

Alignment: Minimal scoliosis. No evidence of acute vertebral body
subluxation.

Vertebrae: No acute or suspicious osseous finding. Degenerative
marrow changes about the L3-4 and L5-S1 in place.

Conus medullaris and cauda equina: Conus extends to the L1-2 level.
Conus and cauda equina appear normal.

Paraspinal and other soft tissues: Unremarkable.

Disc levels:

L1-2: Disc spaces well preserved. No significant central canal or
neural foramen stenosis.

L2-3: Disc spaces well preserved. No significant central canal or
neural foramen stenosis.

L3-4: Mild disc desiccation with loss of disc space height.
Additional mild degenerative facet hypertrophy bilaterally with
associated hypertrophy of the ligamentum flavum. Associated minimal
central canal stenosis and mild to moderate LEFT neural foramen
stenosis.

L4-5: Disc desiccation with associated broad-based diffuse disc
bulge. Additional degenerative facet hypertrophy and associated
hypertrophy of the ligamentum flavum, combined with the disc bulge,
resulting in mild central canal stenosis and mild to moderate
bilateral neural foramen stenoses.

L5-S1: Disc desiccation with associated broad-based disc bulge.
Additional degenerative facet hypertrophy and associated
hypertrophied ligamentum flavum, combined with the disc bulge,
resulting in mild central canal stenosis and mild to moderate
bilateral neural foramen stenoses.
IMPRESSION: 1. Degenerative changes, as detailed above, with associated disc
bulges at the L4-5 and L5-S1 levels, mild to moderate in degree. No
more than mild central canal stenosis at any level. Neural foramen
stenoses are present at the L3-4 through L5-S1 levels, mild to
moderate in degree, with possible associated mass effect on the
exiting nerve roots.
2. No acute findings.

## 2020-03-03 ENCOUNTER — Other Ambulatory Visit: Payer: Self-pay

## 2020-03-03 ENCOUNTER — Emergency Department (HOSPITAL_COMMUNITY)
Admission: EM | Admit: 2020-03-03 | Discharge: 2020-03-03 | Disposition: A | Discharge status: Home / Self Care | Payer: Medicaid Other | Attending: Emergency Medicine | Admitting: Emergency Medicine

## 2020-03-03 ENCOUNTER — Encounter (HOSPITAL_COMMUNITY): Payer: Self-pay

## 2020-03-03 DIAGNOSIS — L509 Urticaria, unspecified: Secondary | ICD-10-CM | POA: Diagnosis not present

## 2020-03-03 DIAGNOSIS — R0602 Shortness of breath: Secondary | ICD-10-CM | POA: Diagnosis not present

## 2020-03-03 DIAGNOSIS — R0689 Other abnormalities of breathing: Secondary | ICD-10-CM | POA: Diagnosis not present

## 2020-03-03 DIAGNOSIS — Z79899 Other long term (current) drug therapy: Secondary | ICD-10-CM | POA: Diagnosis not present

## 2020-03-03 DIAGNOSIS — Z87891 Personal history of nicotine dependence: Secondary | ICD-10-CM | POA: Diagnosis not present

## 2020-03-03 DIAGNOSIS — T63441A Toxic effect of venom of bees, accidental (unintentional), initial encounter: Secondary | ICD-10-CM | POA: Insufficient documentation

## 2020-03-03 DIAGNOSIS — R21 Rash and other nonspecific skin eruption: Secondary | ICD-10-CM | POA: Diagnosis not present

## 2020-03-03 DIAGNOSIS — T7840XA Allergy, unspecified, initial encounter: Secondary | ICD-10-CM | POA: Diagnosis not present

## 2020-03-03 DIAGNOSIS — R0902 Hypoxemia: Secondary | ICD-10-CM | POA: Diagnosis not present

## 2020-03-03 DIAGNOSIS — T782XXA Anaphylactic shock, unspecified, initial encounter: Secondary | ICD-10-CM

## 2020-03-03 DIAGNOSIS — I517 Cardiomegaly: Secondary | ICD-10-CM | POA: Diagnosis not present

## 2020-03-03 MED ORDER — SODIUM CHLORIDE 0.9 % IV SOLN
INTRAVENOUS | Status: DC
Start: 2020-03-03 — End: 2020-03-03

## 2020-03-03 MED ORDER — FAMOTIDINE IN NACL 20-0.9 MG/50ML-% IV SOLN
20.0000 mg | Freq: Once | INTRAVENOUS | Status: AC
  Administered 2020-03-03: 20 mg via INTRAVENOUS
  Filled 2020-03-03: qty 50

## 2020-03-03 MED ORDER — EPINEPHRINE 0.3 MG/0.3ML IJ SOAJ: 0.3000 mg | INTRAMUSCULAR | 0 refills | Status: DC | PRN

## 2020-03-03 MED ORDER — ONDANSETRON HCL 4 MG/2ML IJ SOLN
4.0000 mg | Freq: Once | INTRAMUSCULAR | Status: AC
  Administered 2020-03-03: 4 mg via INTRAVENOUS
  Filled 2020-03-03: qty 2

## 2020-03-03 MED ORDER — FAMOTIDINE 20 MG PO TABS: 20.0000 mg | ORAL_TABLET | Freq: Two times a day (BID) | ORAL | 0 refills | Status: DC

## 2020-03-03 MED ORDER — PREDNISONE 50 MG PO TABS: 50.0000 mg | ORAL_TABLET | Freq: Every day | ORAL | 0 refills | Status: AC

## 2020-03-03 MED ORDER — EPINEPHRINE 0.3 MG/0.3ML IJ SOAJ: 0.3000 mg | INTRAMUSCULAR | 0 refills | Status: AC | PRN

## 2020-03-03 NOTE — ED Triage Notes (Signed)
Pt BIB [facility] from home following yellow jacket sting. 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 Pt received 0.3 IM Epi, 5 mg Albuterol, 125 Solumedrol, and 50 mg Benadryl. Initial c/o diminshed breath sounds, hives covering body, chest pressure. VSS, NAD noted, A&Ox4.
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
## 2020-03-04 MED FILL — FAMOTIDINE 20 MG TABS: 20 | 15 days supply | Qty: 30 | Fill #0

## 2020-03-04 MED FILL — EPINEPHRINE 0.3 MG AUTO-INJ: 0.3 | 1 days supply | Qty: 2 | Fill #0

## 2020-03-04 MED FILL — predniSONE 10 MG TABS: 10 | 4 days supply | Qty: 20 | Fill #0

## 2020-03-19 MED FILL — ESCITALOPRAM 20 MG TABLET: 20 | 30 days supply | Qty: 30 | Fill #2

## 2020-03-19 MED FILL — GABAPENTIN 300 MG CAPSULE: 300 | 15 days supply | Qty: 90 | Fill #0

## 2020-03-31 ENCOUNTER — Encounter: Payer: Self-pay | Admitting: Podiatry

## 2020-03-31 ENCOUNTER — Other Ambulatory Visit: Payer: Self-pay

## 2020-03-31 ENCOUNTER — Other Ambulatory Visit (INDEPENDENT_AMBULATORY_CARE_PROVIDER_SITE_OTHER): Payer: Self-pay | Admitting: Family Medicine

## 2020-03-31 ENCOUNTER — Encounter (INDEPENDENT_AMBULATORY_CARE_PROVIDER_SITE_OTHER): Payer: Self-pay | Admitting: Family Medicine

## 2020-03-31 ENCOUNTER — Ambulatory Visit: Payer: Medicaid Other | Admitting: Podiatry

## 2020-03-31 ENCOUNTER — Ambulatory Visit (INDEPENDENT_AMBULATORY_CARE_PROVIDER_SITE_OTHER): Payer: Medicaid Other | Admitting: Family Medicine

## 2020-03-31 VITALS — BP 131/77 | HR 60 | Ht 74.0 in | Wt 164.0 lb

## 2020-03-31 DIAGNOSIS — B351 Tinea unguium: Secondary | ICD-10-CM | POA: Diagnosis not present

## 2020-03-31 DIAGNOSIS — M79674 Pain in right toe(s): Secondary | ICD-10-CM

## 2020-03-31 DIAGNOSIS — M79671 Pain in right foot: Secondary | ICD-10-CM

## 2020-03-31 DIAGNOSIS — M79675 Pain in left toe(s): Secondary | ICD-10-CM | POA: Diagnosis not present

## 2020-03-31 DIAGNOSIS — M79672 Pain in left foot: Secondary | ICD-10-CM

## 2020-03-31 DIAGNOSIS — F17200 Nicotine dependence, unspecified, uncomplicated: Secondary | ICD-10-CM

## 2020-03-31 DIAGNOSIS — M5441 Lumbago with sciatica, right side: Secondary | ICD-10-CM | POA: Diagnosis not present

## 2020-03-31 DIAGNOSIS — Q828 Other specified congenital malformations of skin: Secondary | ICD-10-CM

## 2020-03-31 DIAGNOSIS — Z13228 Encounter for screening for other metabolic disorders: Secondary | ICD-10-CM

## 2020-03-31 DIAGNOSIS — M5442 Lumbago with sciatica, left side: Secondary | ICD-10-CM | POA: Diagnosis not present

## 2020-03-31 DIAGNOSIS — G8929 Other chronic pain: Secondary | ICD-10-CM

## 2020-03-31 MED ORDER — BUPROPION HCL ER (XL) 150 MG PO TB24: 150.0000 mg | ORAL_TABLET | Freq: Every day | ORAL | 3 refills | Status: DC

## 2020-03-31 MED ORDER — GABAPENTIN 300 MG PO CAPS: 600.0000 mg | ORAL_CAPSULE | Freq: Three times a day (TID) | ORAL | 6 refills | Status: DC

## 2020-03-31 MED FILL — BUPROPION HCL XL 150 MG TAB: 150 | 30 days supply | Qty: 30 | Fill #0

## 2020-03-31 MED FILL — GABAPENTIN 300 MG CAPSULE: 300 | 30 days supply | Qty: 180 | Fill #0

## 2020-03-31 NOTE — Progress Notes (Signed)
Subjective:  Patient ID: Logan Bailey, male    DOB: 01-Nov-1960  Age: 59 y.o. MRN: [id number]  CC: Hospitalization Follow-up   HPI Logan Bailey is a 59 year old male with a history of chronic low back pain with sciatica here for follow-up visit. He was seen at the ED on 03/03/2020 for an anaphylactic reaction secondary to yellowjacket.  He was treated with EpiPen, prednisone and famotidine.  He reports resolution of his symptoms. States he has some fangs in his skin but no hives Seen by Podiatry today and he had his toenail trimmed today. Back pain is controlled and he is requesting refill of gabapentin. Requests assistance with smoking cessation; would rather a pill as the patches do not stick to his skin.  Past Medical History:  Diagnosis Date  . Depression   . GERD (gastroesophageal reflux disease)   . Seasonal allergies     Past Surgical History:  Procedure Laterality Date  . HERNIA REPAIR  10/16/2012   incarcerated  . INGUINAL HERNIA REPAIR Left 10/16/2012   Procedure: HERNIA REPAIR INGUINAL ADULT;  Surgeon: Axel Filler, MD;  Location: [facility] OR;  Service: General;  Laterality: Left;    History reviewed. No pertinent family history.  Allergies  Allergen Reactions  . Bee Venom Anaphylaxis and Hives    Outpatient Medications Prior to Visit  Medication Sig Dispense Refill  . EPINEPHrine 0.3 mg/0.3 mL IJ SOAJ injection Inject 0.3 mLs (0.3 mg total) into the muscle as needed for anaphylaxis. 1 each 0  . escitalopram (LEXAPRO) 20 MG tablet Take 1 tablet (20 mg total) by mouth daily. 90 tablet 1  . famotidine (PEPCID) 20 MG tablet Take 1 tablet (20 mg total) by mouth 2 (two) times daily. 30 tablet 0  . vitamin B-12 (CYANOCOBALAMIN) 100 MCG tablet Take 200 mcg by mouth daily.     . diphenhydrAMINE (BENADRYL) 25 MG tablet Take 25 mg by mouth every 6 (six) hours as needed for itching or allergies. (Patient not taking: Reported on 03/31/2020)    . EPINEPHrine (EPIPEN 2-PAK)  0.3 mg/0.3 mL IJ SOAJ injection Inject 0.3 mLs (0.3 mg total) into the muscle as needed for anaphylaxis. Use as directed, in event of severe allergic reaction (Patient not taking: Reported on 03/31/2020) 1 each 0  . nicotine (NICODERM CQ) 14 mg/24hr patch Place 1 patch (14 mg total) onto the skin daily. (Patient not taking: Reported on 03/03/2020) 28 patch 0  . nicotine (NICODERM CQ) 21 mg/24hr patch Place 1 patch (21 mg total) onto the skin daily. (Patient not taking: Reported on 03/03/2020) 28 patch 0  . nicotine (NICODERM CQ) 7 mg/24hr patch Place 1 patch (7 mg total) onto the skin daily. (Patient not taking: Reported on 03/03/2020) 28 patch 0  . predniSONE (DELTASONE) 10 MG tablet Take 50 mg by mouth daily. (Patient not taking: Reported on 03/31/2020)    . gabapentin (NEURONTIN) 300 MG capsule TAKE 2 CAPSULES (600 MG TOTAL) BY MOUTH 3 (THREE) TIMES DAILY. (Patient not taking: Reported on 03/31/2020) 180 capsule 1   No facility-administered medications prior to visit.     ROS Review of Systems  Constitutional: Negative for activity change and appetite change.  HENT: Negative for sinus pressure and sore throat.   Eyes: Negative for visual disturbance.  Respiratory: Negative for cough, chest tightness and shortness of breath.   Cardiovascular: Negative for chest pain and leg swelling.  Gastrointestinal: Negative for abdominal distention, abdominal pain, constipation and diarrhea.  Endocrine: Negative.   Genitourinary:  Negative for dysuria.  Musculoskeletal: Negative for joint swelling and myalgias.  Skin: Negative for rash.  Allergic/Immunologic: Negative.   Neurological: Negative for weakness, light-headedness and numbness.  Psychiatric/Behavioral: Negative for dysphoric mood and suicidal ideas.    Objective:  BP 131/77   Pulse 60   Ht 6\' 2"  (1.88 m)   Wt 164 lb (74.4 kg)   SpO2 97%   BMI 21.06 kg/m   BP/Weight 03/31/2020 03/03/2020 04/12/2019  Systolic BP 131 127 124  Diastolic BP 77  78 82  Wt. (Lbs) 164 170 161.8  BMI 21.06 21.83 20.77      Physical Exam Constitutional:      Appearance: He is well-developed.  Neck:     Vascular: No JVD.  Cardiovascular:     Rate and Rhythm: Normal rate.     Heart sounds: Normal heart sounds. No murmur heard.   Pulmonary:     Effort: Pulmonary effort is normal.     Breath sounds: Normal breath sounds. No wheezing or rales.  Chest:     Chest wall: No tenderness.  Abdominal:     General: Bowel sounds are normal. There is no distension.     Palpations: Abdomen is soft. There is no mass.     Tenderness: There is no abdominal tenderness.  Musculoskeletal:        General: Normal range of motion.     Right lower leg: No edema.     Left lower leg: No edema.  Skin:    Comments: Scar left forearm from previous bite.  Neurological:     Mental Status: He is alert and oriented to person, place, and time.  Psychiatric:        Mood and Affect: Mood normal.     CMP Latest Ref Rng & Units 04/12/2019 08/16/2017 07/30/2017  Glucose 65 - 99 mg/dL 87 76 98  BUN 6 - 24 mg/dL 11 9 7   Creatinine 0.76 - 1.27 mg/dL 08/01/2017 2.22  Sodium 134 - 144 mmol/L 144 144 141  Potassium 3.5 - 5.2 mmol/L 4.1 4.1 3.9  Chloride 96 - 106 mmol/L 106 102 110  CO2 20 - 29 mmol/L 25 24 25   Calcium 8.7 - 10.2 mg/dL 9.2 9.5 9.79)  Total Protein 6.0 - 8.5 g/dL 6.2 6.7 6.0(L)  Total Bilirubin 0.0 - 1.2 mg/dL 0.3 0.2 0.4  Alkaline Phos 39 - 117 IU/L 52 59 54  AST 0 - 40 IU/L 14 14 18   ALT 0 - 44 IU/L 13 13 22     Lipid Panel     Component Value Date/Time   CHOL 156 04/12/2019 1142   TRIG 43 04/12/2019 1142   HDL 49 04/12/2019 1142   CHOLHDL 3.2 04/12/2019 1142   LDLCALC 98 04/12/2019 1142    CBC    Component Value Date/Time   WBC 4.2 04/12/2019 1142   WBC 5.9 07/30/2017 1138   RBC 4.37 04/12/2019 1142   RBC 4.43 07/30/2017 1138   HGB 13.1 04/12/2019 1142   HCT 40.1 04/12/2019 1142   PLT 264 04/12/2019 1142   MCV 92 04/12/2019 1142   MCH  30.0 04/12/2019 1142   MCH 29.3 07/30/2017 1138   MCHC 32.7 04/12/2019 1142   MCHC 31.6 07/30/2017 1138   RDW 13.3 04/12/2019 1142   LYMPHSABS 1.4 04/12/2019 1142   MONOABS 0.4 07/30/2017 1138   EOSABS 0.0 04/12/2019 1142   BASOSABS 0.0 04/12/2019 1142    No results found for: HGBA1C  Assessment & Plan:  1.  Chronic bilateral low back pain with bilateral sciatica Stable - gabapentin (NEURONTIN) 300 MG capsule; Take 2 capsules (600 mg total) by mouth 3 (three) times daily.  Dispense: 180 capsule; Refill: 6  2. Screening for metabolic disorder - Basic Metabolic Panel  3. Tobacco use disorder Spent 3 minutes counseling on cessation of smoking and he is willing to try Wellbutrin to aid in quitting - buPROPion (WELLBUTRIN XL) 150 MG 24 hr tablet; Take 1 tablet (150 mg total) by mouth daily. For smoking cessation  Dispense: 30 tablet; Refill: 3     Meds ordered this encounter  Medications  . gabapentin (NEURONTIN) 300 MG capsule    Sig: Take 2 capsules (600 mg total) by mouth 3 (three) times daily.    Dispense:  180 capsule    Refill:  6  . buPROPion (WELLBUTRIN XL) 150 MG 24 hr tablet    Sig: Take 1 tablet (150 mg total) by mouth daily. For smoking cessation    Dispense:  30 tablet    Refill:  3    Follow-up: Return in about 6 months (around 10/01/2020) for medical conditions.       Hoy Register, MD, FAAFP. [facility] [place], [place] [phone]   03/31/2020, 2:34 PM

## 2020-03-31 NOTE — Patient Instructions (Signed)

## 2020-04-01 LAB — BASIC METABOLIC PANEL
BUN/Creatinine Ratio: 14 (ref 9–20)
BUN: 12 mg/dL (ref 6–24)
CO2: 26 mmol/L (ref 20–29)
Calcium: 9.2 mg/dL (ref 8.7–10.2)
Chloride: 106 mmol/L (ref 96–106)
Creatinine, Ser: 0.87 mg/dL (ref 0.76–1.27)
GFR calc Af Amer: 110 mL/min/{1.73_m2} (ref >59)
GFR calc non Af Amer: 95 mL/min/{1.73_m2} (ref >59)
Glucose: 82 mg/dL (ref 65–99)
Potassium: 4.1 mmol/L (ref 3.5–5.2)
Sodium: 142 mmol/L (ref 134–144)

## 2020-04-02 ENCOUNTER — Telehealth: Payer: Self-pay

## 2020-04-02 NOTE — Telephone Encounter (Signed)
-----   Message from Hoy Register, MD sent at 04/01/2020  8:45 AM EDT ----- Please inform the patient that labs are normal. Thank you.

## 2020-04-02 NOTE — Telephone Encounter (Signed)
Patient was called and a voicemail was left informing patient to return phone call for lab results. 

## 2020-04-03 NOTE — Progress Notes (Signed)
Subjective: Logan Bailey is a 59 y.o. male patient seen today painful mycotic nails b/l that are difficult to trim. Pain interferes with ambulation. Aggravating factors include wearing enclosed shoe gear. Pain is relieved with periodic professional debridement.  Patient states calluses are extremely painful on today. He is requesting debridement.  Patient Active Problem List   Diagnosis Date Noted  . Poor dentition 06/23/2018  . Tobacco use disorder 06/23/2018  . Depression with anxiety 06/23/2018  . 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 Porokeratosis 01/13/2018    Current Outpatient Medications on File Prior to Visit  Medication Sig Dispense Refill  . diphenhydrAMINE (BENADRYL) 25 MG tablet Take 25 mg by mouth every 6 (six) hours as needed for itching or allergies. (Patient not taking: Reported on 03/31/2020)    . EPINEPHrine (EPIPEN 2-PAK) 0.3 mg/0.3 mL IJ SOAJ injection Inject 0.3 mLs (0.3 mg total) into the muscle as needed for anaphylaxis. Use as directed, in event of severe allergic reaction (Patient not taking: Reported on 03/31/2020) 1 each 0  . EPINEPHrine 0.3 mg/0.3 mL IJ SOAJ injection Inject 0.3 mLs (0.3 mg total) into the muscle as needed for anaphylaxis. 1 each 0  . escitalopram (LEXAPRO) 20 MG tablet Take 1 tablet (20 mg total) by mouth daily. 90 tablet 1  . famotidine (PEPCID) 20 MG tablet Take 1 tablet (20 mg total) by mouth 2 (two) times daily. 30 tablet 0  . nicotine (NICODERM CQ) 14 mg/24hr patch Place 1 patch (14 mg total) onto the skin daily. (Patient not taking: Reported on 03/03/2020) 28 patch 0  . nicotine (NICODERM CQ) 21 mg/24hr patch Place 1 patch (21 mg total) onto the skin daily. (Patient not taking: Reported on 03/03/2020) 28 patch 0  . nicotine (NICODERM CQ) 7 mg/24hr patch Place 1 patch (7 mg total) onto the skin daily. (Patient not taking: Reported on 03/03/2020) 28 patch 0  . predniSONE (DELTASONE) 10 MG tablet Take 50 mg by  mouth daily. (Patient not taking: Reported on 03/31/2020)    . vitamin B-12 (CYANOCOBALAMIN) 100 MCG tablet Take 200 mcg by mouth daily.      No current facility-administered medications on file prior to visit.    Allergies  Allergen Reactions  . Bee Venom Anaphylaxis and Hives    Objective: Physical Exam  General: Logan Bailey is a pleasant 59 y.o. African American male no acute distress, awake, alert and oriented x 3  Neurovascular status unchanged b/l.  Capillary refill time to digits immediate b/l. Palpable DP pulses b/l. Palpable PT pulses b/l. Pedal hair sparse b/l. Skin temperature gradient within normal limits b/l. No edema noted b/l. No pain with calf compression b/l.  Protective sensation intact 5/5 intact bilaterally with 10g monofilament b/l. Vibratory sensation intact b/l.  Dermatological:  Pedal skin with normal turgor, texture and tone bilaterally. No open wounds bilaterally. No interdigital macerations bilaterally. Toenails 1-5 b/l elongated, discolored, dystrophic, thickened, crumbly with subungual debris and tenderness to dorsal palpation. Porokeratotic lesion(s) submet head 1 left foot, submet head 1 right foot, submet head 5 left foot and submet head 5 right foot. No erythema, no edema, no drainage, no flocculence.  Musculoskeletal:  Normal muscle strength 5/5 to all lower extremity muscle groups bilaterally. No pain crepitus or joint limitation noted with ROM b/l. Hallux valgus with bunion deformity noted b/l. Utilizes walking stick for ambulation assistance.  Assessment and Plan:  1. Pain due to onychomycosis of toenails of both feet   2. Porokeratosis  3. Pain in both feet    -Examined patient. -No new findings. No new orders. -Medicaid ABN signed for this year. Copy given to patient on today's visit and copy placed in patient chart. Today, he signed new ABN approving paring of porokeratoses b/l feet.  -Toenails 1-5 b/l were debrided in length and girth with  sterile nail nippers and dremel without iatrogenic bleeding.  -Patient to continue soft, supportive shoe gear daily. -Patient to report any pedal injuries to medical professional immediately. -Patient/POA to call should there be question/concern in the interim.  Return in about 3 months (around 07/01/2020) for nail trim.  Logan Bailey, DPM

## 2020-05-13 MED FILL — ESCITALOPRAM 20 MG TABLET: 20 | 30 days supply | Qty: 30 | Fill #3

## 2020-05-16 ENCOUNTER — Other Ambulatory Visit (INDEPENDENT_AMBULATORY_CARE_PROVIDER_SITE_OTHER): Payer: Self-pay | Admitting: Primary Care

## 2020-05-16 DIAGNOSIS — F17200 Nicotine dependence, unspecified, uncomplicated: Secondary | ICD-10-CM

## 2020-05-16 MED ORDER — BUPROPION HCL ER (XL) 150 MG PO TB24: 150.0000 mg | ORAL_TABLET | Freq: Every day | ORAL | 1 refills | Status: DC

## 2020-05-16 MED FILL — BUPROPION HCL XL 150 MG TAB: 150 | 90 days supply | Qty: 90 | Fill #0

## 2020-06-27 ENCOUNTER — Telehealth: Payer: Self-pay | Admitting: Podiatry

## 2020-06-27 NOTE — Telephone Encounter (Signed)
Pt left message stating he was returning a call about r/s appt and stated he has an appt on 11.24 and wanting to know if his insurance will cover them.  Upon looking pt is scheduled for foot orthotics on 11.24 and they are not covered by his insurance. I have left him a message stating they are not covered and the cost is 438.00 and to call to discuss further.

## 2020-07-02 ENCOUNTER — Ambulatory Visit: Payer: Medicaid Other | Admitting: Podiatry

## 2020-07-02 ENCOUNTER — Other Ambulatory Visit: Payer: Medicaid Other | Admitting: Orthotics

## 2020-07-08 MED FILL — GABAPENTIN 300 MG CAPSULE: 300 | 30 days supply | Qty: 180 | Fill #1

## 2020-08-15 ENCOUNTER — Other Ambulatory Visit (INDEPENDENT_AMBULATORY_CARE_PROVIDER_SITE_OTHER): Payer: Self-pay | Admitting: Primary Care

## 2020-08-15 DIAGNOSIS — F418 Other specified anxiety disorders: Secondary | ICD-10-CM

## 2020-08-15 MED FILL — GABAPENTIN 300 MG CAPSULE: 300 | 30 days supply | Qty: 180 | Fill #2

## 2020-09-02 ENCOUNTER — Ambulatory Visit (INDEPENDENT_AMBULATORY_CARE_PROVIDER_SITE_OTHER): Payer: Medicaid Other | Admitting: Podiatry

## 2020-09-02 ENCOUNTER — Other Ambulatory Visit: Payer: Self-pay

## 2020-09-02 ENCOUNTER — Encounter: Payer: Self-pay | Admitting: Podiatry

## 2020-09-02 DIAGNOSIS — M79671 Pain in right foot: Secondary | ICD-10-CM

## 2020-09-02 DIAGNOSIS — M79672 Pain in left foot: Secondary | ICD-10-CM | POA: Diagnosis not present

## 2020-09-02 DIAGNOSIS — M79675 Pain in left toe(s): Secondary | ICD-10-CM | POA: Diagnosis not present

## 2020-09-02 DIAGNOSIS — B351 Tinea unguium: Secondary | ICD-10-CM

## 2020-09-02 DIAGNOSIS — Q828 Other specified congenital malformations of skin: Secondary | ICD-10-CM | POA: Diagnosis not present

## 2020-09-02 DIAGNOSIS — M79674 Pain in right toe(s): Secondary | ICD-10-CM | POA: Diagnosis not present

## 2020-09-06 NOTE — Progress Notes (Signed)
Subjective: MYRON STANKOVICH is a 60 y.o. male patient seen today painful mycotic nails b/l that are difficult to trim. Pain interferes with ambulation. Aggravating factors include wearing enclosed shoe gear. Pain is relieved with periodic professional debridement.  Patient states calluses are extremely painful on today. He is requesting debridement. He voices no new pedal concerns on today's visit.  Patient Active Problem List   Diagnosis Date Noted   Poor dentition 06/23/2018   Tobacco use disorder 06/23/2018   Depression with anxiety 06/23/2018   Chronic bilateral low back pain with bilateral sciatica 06/23/2018   Porokeratosis 01/13/2018    Current Outpatient Medications on File Prior to Visit  Medication Sig Dispense Refill   buPROPion (WELLBUTRIN XL) 150 MG 24 hr tablet Take 1 tablet (150 mg total) by mouth daily. For smoking cessation 90 tablet 1   diphenhydrAMINE (BENADRYL) 25 MG tablet Take 25 mg by mouth every 6 (six) hours as needed for itching or allergies. (Patient not taking: Reported on 03/31/2020)     EPINEPHrine (EPIPEN 2-PAK) 0.3 mg/0.3 mL IJ SOAJ injection Inject 0.3 mLs (0.3 mg total) into the muscle as needed for anaphylaxis. Use as directed, in event of severe allergic reaction (Patient not taking: Reported on 03/31/2020) 1 each 0   EPINEPHrine 0.3 mg/0.3 mL IJ SOAJ injection Inject 0.3 mLs (0.3 mg total) into the muscle as needed for anaphylaxis. 1 each 0   famotidine (PEPCID) 20 MG tablet Take 1 tablet (20 mg total) by mouth 2 (two) times daily. 30 tablet 0   gabapentin (NEURONTIN) 300 MG capsule Take 2 capsules (600 mg total) by mouth 3 (three) times daily. 180 capsule 6   nicotine (NICODERM CQ) 14 mg/24hr patch Place 1 patch (14 mg total) onto the skin daily. (Patient not taking: Reported on 03/03/2020) 28 patch 0   nicotine (NICODERM CQ) 21 mg/24hr patch Place 1 patch (21 mg total) onto the skin daily. (Patient not taking: Reported on 03/03/2020) 28 patch 0    nicotine (NICODERM CQ) 7 mg/24hr patch Place 1 patch (7 mg total) onto the skin daily. (Patient not taking: Reported on 03/03/2020) 28 patch 0   predniSONE (DELTASONE) 10 MG tablet Take 50 mg by mouth daily. (Patient not taking: Reported on 03/31/2020)     vitamin B-12 (CYANOCOBALAMIN) 100 MCG tablet Take 200 mcg by mouth daily.      No current facility-administered medications on file prior to visit.    Allergies  Allergen Reactions   Bee Venom Anaphylaxis and Hives    Objective: Physical Exam  General: Mr. Scholz is a pleasant 60 y.o. African American male no acute distress, awake, alert and oriented x 3  Neurovascular status unchanged b/l.  Capillary refill time to digits immediate b/l. Palpable DP pulses b/l. Palpable PT pulses b/l. Pedal hair sparse b/l. Skin temperature gradient within normal limits b/l. No edema noted b/l. No pain with calf compression b/l.  Protective sensation intact 5/5 intact bilaterally with 10g monofilament b/l. Vibratory sensation intact b/l.  Dermatological:  Pedal skin with normal turgor, texture and tone bilaterally. No open wounds bilaterally. No interdigital macerations bilaterally. Toenails 1-5 b/l elongated, discolored, dystrophic, thickened, crumbly with subungual debris and tenderness to dorsal palpation. Porokeratotic lesion(s) submet head 1 left foot, submet head 1 right foot, submet head 5 left foot and submet head 5 right foot. No erythema, no edema, no drainage, no flocculence.  Musculoskeletal:  Normal muscle strength 5/5 to all lower extremity muscle groups bilaterally. No pain crepitus or joint  limitation noted with ROM b/l. Hallux valgus with bunion deformity noted b/l. Utilizes walking stick for ambulation assistance.  Assessment and Plan:  1. Pain due to onychomycosis of toenails of both feet   2. Porokeratosis   3. Pain in both feet    -Examined patient. -No new findings. No new orders. -Medicaid ABN signed for this year. Copy  given to patient on today's visit and copy placed in patient chart. Today, he signed new ABN approving paring of porokeratoses b/l feet. -Porokeratosis submet heads 1, 5 b/l  pared and enucleated with sterile scalpel blade without incident. -Toenails 1-5 b/l were debrided in length and girth with sterile nail nippers and dremel without iatrogenic bleeding.  -Patient to continue soft, supportive shoe gear daily. -Patient to report any pedal injuries to medical professional immediately. -Patient/POA to call should there be question/concern in the interim.  Return in about 3 months (around 12/01/2020).  Freddie Breech, DPM

## 2020-10-01 ENCOUNTER — Other Ambulatory Visit (INDEPENDENT_AMBULATORY_CARE_PROVIDER_SITE_OTHER): Payer: Self-pay | Admitting: Primary Care

## 2020-10-01 ENCOUNTER — Ambulatory Visit (INDEPENDENT_AMBULATORY_CARE_PROVIDER_SITE_OTHER): Payer: Medicaid Other | Admitting: Primary Care

## 2020-10-01 ENCOUNTER — Encounter (INDEPENDENT_AMBULATORY_CARE_PROVIDER_SITE_OTHER): Payer: Self-pay | Admitting: Primary Care

## 2020-10-01 ENCOUNTER — Other Ambulatory Visit: Payer: Self-pay

## 2020-10-01 VITALS — BP 119/82 | HR 63 | Temp 97.5°F | Ht 74.0 in | Wt 157.6 lb

## 2020-10-01 DIAGNOSIS — K219 Gastro-esophageal reflux disease without esophagitis: Secondary | ICD-10-CM | POA: Diagnosis not present

## 2020-10-01 DIAGNOSIS — Z1211 Encounter for screening for malignant neoplasm of colon: Secondary | ICD-10-CM | POA: Diagnosis not present

## 2020-10-01 DIAGNOSIS — Z23 Encounter for immunization: Secondary | ICD-10-CM

## 2020-10-01 DIAGNOSIS — F17210 Nicotine dependence, cigarettes, uncomplicated: Secondary | ICD-10-CM

## 2020-10-01 MED ORDER — OMEPRAZOLE 20 MG PO CPDR: 20.0000 mg | DELAYED_RELEASE_CAPSULE | Freq: Every day | ORAL | 3 refills | Status: DC

## 2020-10-01 MED FILL — OMEPRAZOLE 20 MG CAP: 20 | 30 days supply | Qty: 30 | Fill #0

## 2020-10-01 NOTE — Progress Notes (Signed)
Established Patient Office Visit  Subjective:  Patient ID: Logan Bailey, male    DOB: [birth year]  Age: 60 y.o. MRN: [id number]  CC:  Chief Complaint  Patient presents with  . medical conditions    HPI Logan Bailey is a 60 year old male who  presents for medical evaluations , voiced only stress about neighbors getting on nerves. Pain were hernia repaired 2014 intermittent. Question what would cause stomach to burn.  Past Medical History:  Diagnosis Date  . Depression   . GERD (gastroesophageal reflux disease)   . Seasonal allergies     Past Surgical History:  Procedure Laterality Date  . HERNIA REPAIR  10/16/2012   incarcerated  . INGUINAL HERNIA REPAIR Left 10/16/2012   Procedure: HERNIA REPAIR INGUINAL ADULT;  Surgeon: Axel Filler, MD;  Location: [facility] OR;  Service: General;  Laterality: Left;    History reviewed. No pertinent family history.  Social History   Socioeconomic History  . Marital status: Divorced    Spouse name: Not on file  . Number of children: Not on file  . Years of education: Not on file  . Highest education level: Not on file  Occupational History  . Not on file  Tobacco Use  . Smoking status: Former Smoker    Packs/day: 1.00    Years: 30.00    Pack years: 30.00    Types: Cigarettes    Quit date: 02/20/2018    Years since quitting: 2.6  . Smokeless tobacco: Never Used  . Tobacco comment: no cigarette in 14 days   Vaping Use  . Vaping Use: Never used  Substance and Sexual Activity  . Alcohol use: No  . Drug use: Yes    Types: Marijuana  . Sexual activity: Not on file  Other Topics Concern  . Not on file  Social History Narrative  . Not on file   Social Determinants of Health   Financial Resource Strain: Not on file  Food Insecurity: Not on file  Transportation Needs: Not on file  Physical Activity: Not on file  Stress: Not on file  Social Connections: Not on file  Intimate Partner Violence: Not on file     Outpatient Medications Prior to Visit  Medication Sig Dispense Refill  . buPROPion (WELLBUTRIN XL) 150 MG 24 hr tablet Take 1 tablet (150 mg total) by mouth daily. For smoking cessation 90 tablet 1  . gabapentin (NEURONTIN) 300 MG capsule Take 2 capsules (600 mg total) by mouth 3 (three) times daily. 180 capsule 6  . vitamin B-12 (CYANOCOBALAMIN) 100 MCG tablet Take 200 mcg by mouth daily.     Marland Kitchen EPINEPHrine (EPIPEN 2-PAK) 0.3 mg/0.3 mL IJ SOAJ injection Inject 0.3 mLs (0.3 mg total) into the muscle as needed for anaphylaxis. Use as directed, in event of severe allergic reaction (Patient not taking: Reported on 03/31/2020) 1 each 0  . EPINEPHrine 0.3 mg/0.3 mL IJ SOAJ injection Inject 0.3 mLs (0.3 mg total) into the muscle as needed for anaphylaxis. 1 each 0  . famotidine (PEPCID) 20 MG tablet Take 1 tablet (20 mg total) by mouth 2 (two) times daily. (Patient not taking: Reported on 10/01/2020) 30 tablet 0  . diphenhydrAMINE (BENADRYL) 25 MG tablet Take 25 mg by mouth every 6 (six) hours as needed for itching or allergies. (Patient not taking: Reported on 03/31/2020)    . nicotine (NICODERM CQ) 14 mg/24hr patch Place 1 patch (14 mg total) onto the skin daily. (Patient not taking: Reported  on 03/03/2020) 28 patch 0  . nicotine (NICODERM CQ) 21 mg/24hr patch Place 1 patch (21 mg total) onto the skin daily. (Patient not taking: Reported on 03/03/2020) 28 patch 0  . nicotine (NICODERM CQ) 7 mg/24hr patch Place 1 patch (7 mg total) onto the skin daily. (Patient not taking: Reported on 03/03/2020) 28 patch 0  . predniSONE (DELTASONE) 10 MG tablet Take 50 mg by mouth daily. (Patient not taking: Reported on 03/31/2020)     No facility-administered medications prior to visit.    Allergies  Allergen Reactions  . Bee Venom Anaphylaxis and Hives    ROS Pertinent positive and negative noted in HPI   Objective:    Physical Exam  BP 119/82 (BP Location: Right Arm, Patient Position: Sitting, Cuff Size:  Normal)   Pulse 63   Temp (!) 97.5 F (36.4 C) (Temporal)   Ht 6\' 2"  (1.88 m)   Wt 157 lb 9.6 oz (71.5 kg)   SpO2 99%   BMI 20.23 kg/m  Wt Readings from Last 3 Encounters:  10/01/20 157 lb 9.6 oz (71.5 kg)  03/31/20 164 lb (74.4 kg)  03/03/20 170 lb (77.1 kg)   General: Vital signs reviewed.  Patient is well-developed and well-nourished,thin frame man  in no acute distress and cooperative with exam.  Head: Normocephalic and atraumatic. Eyes: EOMI, conjunctivae normal, no scleral icterus.  Neck: Supple, trachea midline, normal ROM, no JVD, masses, thyromegaly, or carotid bruit present.  Cardiovascular: RRR, S1 normal, S2 normal, no murmurs, gallops, or rubs. Pulmonary/Chest: Clear to auscultation bilaterally, no wheezes, rales, or rhonchi. Abdominal: Soft, non-tender, non-distended, BS +, Musculoskeletal: No joint deformities, erythema, or stiffness, ROM full and nontender. Extremities: No lower extremity edema bilaterally,  pulses symmetric and intact bilaterally. No cyanosis or clubbing. Neurological: A&O x3, Strength is normal and symmetric bilaterally,  Skin: Warm, dry and intact. No rashes or erythema. Psychiatric: Normal mood and affect. speech and behavior is normal. Cognition and memory are questioned  Health Maintenance Due  Topic Date Due  . COVID-19 Vaccine (1) Never done  . Fecal DNA (Cologuard)  09/13/2020    There are no preventive care reminders to display for this patient.  Lab Results  Component Value Date   TSH 2.900 08/16/2017   Lab Results  Component Value Date   WBC 4.2 04/12/2019   HGB 13.1 04/12/2019   HCT 40.1 04/12/2019   MCV 92 04/12/2019   PLT 264 04/12/2019   Lab Results  Component Value Date   NA 142 03/31/2020   K 4.1 03/31/2020   CO2 26 03/31/2020   GLUCOSE 82 03/31/2020   BUN 12 03/31/2020   CREATININE 0.87 03/31/2020   BILITOT 0.3 04/12/2019   ALKPHOS 52 04/12/2019   AST 14 04/12/2019   ALT 13 04/12/2019   PROT 6.2 04/12/2019    ALBUMIN 4.3 04/12/2019   CALCIUM 9.2 03/31/2020   ANIONGAP 6 07/30/2017   Lab Results  Component Value Date   CHOL 156 04/12/2019   Lab Results  Component Value Date   HDL 49 04/12/2019   Lab Results  Component Value Date   LDLCALC 98 04/12/2019   Lab Results  Component Value Date   TRIG 43 04/12/2019   Lab Results  Component Value Date   CHOLHDL 3.2 04/12/2019   No results found for: HGBA1C    Assessment & Plan:  Bernie was seen today for medical conditions.  Diagnoses and all orders for this visit:  Gastroesophageal reflux disease without esophagitis Discussed  eating small frequent meal, reduction in acidic foods, fried foods ,spicy foods, alcohol caffeine and tobacco and certain medications. Avoid laying down after eating 62mins-1hour, elevated head of the bed. Prescribed omeprazole   Tobacco dependence due to cigarettes Patient is aware of risk for increase respiratory infections and lung cancer . Each visit will discuss cessation.  Flu vaccine need Given     Follow-up: Return if symptoms worsen or fail to improve.    Grayce Sessions, NP

## 2020-10-02 ENCOUNTER — Other Ambulatory Visit (INDEPENDENT_AMBULATORY_CARE_PROVIDER_SITE_OTHER): Payer: Self-pay | Admitting: Primary Care

## 2020-10-02 DIAGNOSIS — F418 Other specified anxiety disorders: Secondary | ICD-10-CM

## 2020-10-02 MED FILL — GABAPENTIN 300 MG CAPSULE: 300 | 30 days supply | Qty: 180 | Fill #3

## 2020-10-02 NOTE — Telephone Encounter (Signed)
Sent to PCP to refill if appropriate.  

## 2020-10-03 ENCOUNTER — Other Ambulatory Visit (INDEPENDENT_AMBULATORY_CARE_PROVIDER_SITE_OTHER): Payer: Self-pay | Admitting: Family Medicine

## 2020-10-03 DIAGNOSIS — F418 Other specified anxiety disorders: Secondary | ICD-10-CM

## 2020-10-29 ENCOUNTER — Encounter (INDEPENDENT_AMBULATORY_CARE_PROVIDER_SITE_OTHER): Payer: Medicaid Other | Admitting: Primary Care

## 2020-11-04 ENCOUNTER — Encounter (INDEPENDENT_AMBULATORY_CARE_PROVIDER_SITE_OTHER): Payer: Self-pay | Admitting: Primary Care

## 2020-11-04 ENCOUNTER — Ambulatory Visit (INDEPENDENT_AMBULATORY_CARE_PROVIDER_SITE_OTHER): Payer: Medicaid Other | Admitting: Primary Care

## 2020-11-04 ENCOUNTER — Other Ambulatory Visit: Payer: Self-pay

## 2020-11-04 VITALS — BP 131/83 | HR 63 | Temp 97.9°F | Ht 74.0 in | Wt 166.6 lb

## 2020-11-04 DIAGNOSIS — Z0000 Encounter for general adult medical examination without abnormal findings: Secondary | ICD-10-CM | POA: Diagnosis not present

## 2020-11-04 NOTE — Patient Instructions (Signed)

## 2020-11-04 NOTE — Progress Notes (Signed)
Logan Bailey is a 60 y.o. male presents to office today for annual physical exam examination.    Concerns today include: 1. Does he have diabetes   Occupation: retired , Marital status: D, Substance use: THC/tobacco  Diet: No, Exercise: walks takes care of chickens  Last eye exam: unknown  Last dental exam: unknown  Last colonoscopy: 09/13/17  Refills needed today: no Immunizations needed: Flu Vaccine: yes  Tdap Vaccine: yes 09/13/17- every 77yrs - (<3 lifetime doses or unknown): all wounds -- look up need for Tetanus IG - (>=3 lifetime doses): clean/minor wound if >87yrs from previous; all other wounds if >83yrs from previous Zoster Vaccine: no (those >50yo, once) Pneumonia Vaccine: no (those w/ risk factors) - (<45yr) Both: Immunocompromised, cochlear implant, CSF leak, asplenic, sickle cell, Chronic Renal Failure - (<28yr) PPSV-23 only: Heart dz, lung disease, DM, tobacco abuse, alcoholism, cirrhosis/liver disease. - (>84yr): PPSV13 then PPSV23 in 6-12mths;  - (>32yr): repeat PPSV23 once if pt received prior to 60yo and 33yrs have passed  Past Medical History:  Diagnosis Date  . Depression   . GERD (gastroesophageal reflux disease)   . Seasonal allergies    Social History   Socioeconomic History  . Marital status: Divorced    Spouse name: Not on file  . Number of children: Not on file  . Years of education: Not on file  . Highest education level: Not on file  Occupational History  . Not on file  Tobacco Use  . Smoking status: Former Smoker    Packs/day: 1.00    Years: 30.00    Pack years: 30.00    Types: Cigarettes    Quit date: 02/20/2018    Years since quitting: 2.7  . Smokeless tobacco: Never Used  . Tobacco comment: no cigarette in 14 days   Vaping Use  . Vaping Use: Never used  Substance and Sexual Activity  . Alcohol use: No  . Drug use: Yes    Types: Marijuana  . Sexual activity: Not on file  Other Topics Concern  . Not on file  Social History  Narrative  . Not on file   Social Determinants of Health   Financial Resource Strain: Not on file  Food Insecurity: Not on file  Transportation Needs: Not on file  Physical Activity: Not on file  Stress: Not on file  Social Connections: Not on file  Intimate Partner Violence: Not on file   Past Surgical History:  Procedure Laterality Date  . HERNIA REPAIR  10/16/2012   incarcerated  . INGUINAL HERNIA REPAIR Left 10/16/2012   Procedure: HERNIA REPAIR INGUINAL ADULT;  Surgeon: Axel Filler, MD;  Location: [facility] OR;  Service: General;  Laterality: Left;   No family history on file.  Current Outpatient Medications:  .  buPROPion (WELLBUTRIN XL) 150 MG 24 hr tablet, Take 1 tablet (150 mg total) by mouth daily. For smoking cessation, Disp: 90 tablet, Rfl: 1 .  EPINEPHrine 0.3 mg/0.3 mL IJ SOAJ injection, Inject 0.3 mLs (0.3 mg total) into the muscle as needed for anaphylaxis., Disp: 1 each, Rfl: 0 .  gabapentin (NEURONTIN) 300 MG capsule, Take 2 capsules (600 mg total) by mouth 3 (three) times daily., Disp: 180 capsule, Rfl: 6 .  omeprazole (PRILOSEC) 20 MG capsule, Take 1 capsule (20 mg total) by mouth daily., Disp: 30 capsule, Rfl: 3 .  vitamin B-12 (CYANOCOBALAMIN) 100 MCG tablet, Take 200 mcg by mouth daily. , Disp: , Rfl:  .  EPINEPHrine (EPIPEN 2-PAK) 0.3 mg/0.3  mL IJ SOAJ injection, Inject 0.3 mLs (0.3 mg total) into the muscle as needed for anaphylaxis. Use as directed, in event of severe allergic reaction (Patient not taking: No sig reported), Disp: 1 each, Rfl: 0 .  famotidine (PEPCID) 20 MG tablet, Take 1 tablet (20 mg total) by mouth 2 (two) times daily. (Patient not taking: No sig reported), Disp: 30 tablet, Rfl: 0  Allergies  Allergen Reactions  . Bee Venom Anaphylaxis and Hives     ROS: Review of Systems Review of Systems  Respiratory: Positive for shortness of breath.        Excertion  Musculoskeletal:       Unsteady gait  Uses a cane   Neurological: Positive for  headaches.       2-3 times a week  All other systems reviewed and are negative.   Physical exam BP 131/83 (BP Location: Right Arm, Patient Position: Sitting, Cuff Size: Normal)   Pulse 63   Temp 97.9 F (36.6 C) (Temporal)   Ht 6\' 2"  (1.88 m)   Wt 166 lb 9.6 oz (75.6 kg)   SpO2 96%   BMI 21.39 kg/m   General: Vital signs reviewed.  Patient is well-developed and well-nourished,thin frame male  in no acute distress and cooperative with exam.  Head: Normocephalic and atraumatic. Eyes: EOMI, conjunctivae normal, no scleral icterus.  Neck: Supple, trachea midline, normal ROM, no JVD, masses, thyromegaly, or carotid bruit present.  Cardiovascular: RRR, S1 normal, S2 normal, no murmurs, gallops, or rubs. Pulmonary/Chest: Clear to auscultation bilaterally, no wheezes, rales, or rhonchi. Abdominal: Soft, non-tender, non-distended, BS +, no masses, organomegaly, or guarding present.  Musculoskeletal: No joint deformities, erythema, or stiffness, ROM full and nontender. Extremities: No lower extremity edema bilaterally,   Neurological: A&O x3, Strength is normal aSkin: Warm, dry and intact. No rashes or erythema. Psychiatric: Normal mood and affect. speech and behavior is normal. Cognition and memory impaired   Assessment/ Plan: [name] here for annual physical exam.   No problem-specific Assessment & Plan notes found for this encounter.   Counseled on healthy lifestyle choices, including diet (rich in fruits, vegetables and lean meats and low in salt and simple carbohydrates) and exercise (at least 30 minutes of moderate physical activity daily).  Patient to follow up in 1 year for annual exam or sooner if needed.  The above assessment and management plan was discussed with the patient. The patient verbalized understanding of and has agreed to the management plan. Patient is aware to call the clinic if symptoms persist or worsen. Patient is aware when to return to the clinic for a  follow-up visit. Patient educated on when it is appropriate to go to the emergency department.   [name] NP-C [address] [place] [phone]

## 2020-11-05 ENCOUNTER — Other Ambulatory Visit (INDEPENDENT_AMBULATORY_CARE_PROVIDER_SITE_OTHER): Payer: Self-pay | Admitting: Primary Care

## 2020-11-05 DIAGNOSIS — F418 Other specified anxiety disorders: Secondary | ICD-10-CM

## 2020-11-05 NOTE — Telephone Encounter (Signed)
Refused-Lexapro was discontinued on 05/16/20.

## 2020-11-12 ENCOUNTER — Other Ambulatory Visit: Payer: Self-pay

## 2020-11-12 ENCOUNTER — Ambulatory Visit: Payer: Medicaid Other | Admitting: Podiatry

## 2020-11-12 ENCOUNTER — Encounter: Payer: Self-pay | Admitting: Podiatry

## 2020-11-12 DIAGNOSIS — B351 Tinea unguium: Secondary | ICD-10-CM

## 2020-11-12 DIAGNOSIS — M79672 Pain in left foot: Secondary | ICD-10-CM | POA: Diagnosis not present

## 2020-11-12 DIAGNOSIS — M79675 Pain in left toe(s): Secondary | ICD-10-CM

## 2020-11-12 DIAGNOSIS — M79671 Pain in right foot: Secondary | ICD-10-CM | POA: Diagnosis not present

## 2020-11-12 DIAGNOSIS — Q828 Other specified congenital malformations of skin: Secondary | ICD-10-CM

## 2020-11-12 DIAGNOSIS — M79674 Pain in right toe(s): Secondary | ICD-10-CM

## 2020-11-13 ENCOUNTER — Other Ambulatory Visit: Payer: Self-pay

## 2020-11-13 ENCOUNTER — Telehealth (INDEPENDENT_AMBULATORY_CARE_PROVIDER_SITE_OTHER): Payer: Self-pay

## 2020-11-13 MED FILL — Gabapentin Cap 300 MG: ORAL | 30 days supply | Qty: 180 | Fill #0 | Status: AC

## 2020-11-13 NOTE — Telephone Encounter (Signed)
Patient came into the office to request a refill for Rx #: [id number]  gabapentin (NEURONTIN) 300 MG capsule     Patient uses  [facility] pharmacy    Please advice [phone]

## 2020-11-14 ENCOUNTER — Other Ambulatory Visit (INDEPENDENT_AMBULATORY_CARE_PROVIDER_SITE_OTHER): Payer: Self-pay | Admitting: Primary Care

## 2020-11-14 ENCOUNTER — Other Ambulatory Visit: Payer: Self-pay

## 2020-11-14 DIAGNOSIS — G8929 Other chronic pain: Secondary | ICD-10-CM

## 2020-11-14 DIAGNOSIS — M5442 Lumbago with sciatica, left side: Secondary | ICD-10-CM

## 2020-11-14 MED ORDER — GABAPENTIN 300 MG PO CAPS
ORAL_CAPSULE | Freq: Three times a day (TID) | ORAL | 1 refills | Status: DC
  Filled 2020-11-14: qty 180, fill #0
  Filled 2021-01-09: qty 180, 30d supply, fill #0
  Filled 2021-04-14: qty 180, 30d supply, fill #1

## 2020-11-16 NOTE — Progress Notes (Signed)
Subjective: Logan Bailey is a 60 y.o. male patient seen today painful mycotic nails b/l that are difficult to trim. Pain interferes with ambulation. Aggravating factors include wearing enclosed shoe gear. Pain is relieved with periodic professional debridement.  Patient states calluses are extremely painful on today. He is requesting debridement. He voices no new pedal concerns on today's visit.  Allergies  Allergen Reactions  . Bee Venom Anaphylaxis and Hives   Objective: Physical Exam  General: Logan Bailey is a pleasant 60 y.o. African American male no acute distress, awake, alert and oriented x 3  Neurovascular status unchanged b/l.  Capillary refill time to digits immediate b/l. Palpable DP pulses b/l. Palpable PT pulses b/l. Pedal hair sparse b/l. Skin temperature gradient within normal limits b/l. No edema noted b/l. No pain with calf compression b/l.  Protective sensation intact 5/5 intact bilaterally with 10g monofilament b/l. Vibratory sensation intact b/l.  Dermatological:  Pedal skin with normal turgor, texture and tone bilaterally. No open wounds bilaterally. No interdigital macerations bilaterally. Toenails 1-5 b/l elongated, discolored, dystrophic, thickened, crumbly with subungual debris and tenderness to dorsal palpation. Porokeratotic lesion(s) submet head 1 left foot, submet head 1 right foot, submet head 5 left foot and submet head 5 right foot. No erythema, no edema, no drainage, no flocculence.  Musculoskeletal:  Normal muscle strength 5/5 to all lower extremity muscle groups bilaterally. No pain crepitus or joint limitation noted with ROM b/l. Hallux valgus with bunion deformity noted b/l. Utilizes walking stick for ambulation assistance.  Assessment and Plan:  1. Pain due to onychomycosis of toenails of both feet   2. Porokeratosis   3. Pain in both feet    -Examined patient. -No new findings. No new orders. -Medicaid ABN signed for this year. Copy given to  patient on today's visit and copy placed in patient chart. Today, he signed new ABN approving paring of porokeratoses b/l feet. -Porokeratosis submet heads 1, 5 b/l  pared and enucleated with sterile scalpel blade without incident. -Toenails 1-5 b/l were debrided in length and girth with sterile nail nippers and dremel without iatrogenic bleeding.  -Patient to continue soft, supportive shoe gear daily. -Patient to report any pedal injuries to medical professional immediately. -Patient/POA to call should there be question/concern in the interim.  Return in about 3 months (around 02/11/2021).  Logan Bailey, DPM

## 2020-11-27 ENCOUNTER — Telehealth (INDEPENDENT_AMBULATORY_CARE_PROVIDER_SITE_OTHER): Payer: Self-pay

## 2020-11-27 NOTE — Telephone Encounter (Signed)
Melissa from [employer] called in regards to Mr. Rocky triggering one of their safety protocols. Melissa stated when Mr. Takeem was asked in the last 30 days did he desire to harm or end his life. Mr. Averie Stated " I wish I could go to sleep and never wake up." Melissa assisted Mr. Laksh with providing the help line crisis line, as well as [facility] and other resources.  Melissa stated it was their protocol to contact the patient PCP and advise them of what the patient had stated. If yo have any questions please contact Melissa @ [phone].  This is an Financial planner

## 2020-12-03 ENCOUNTER — Telehealth (INDEPENDENT_AMBULATORY_CARE_PROVIDER_SITE_OTHER): Payer: Self-pay

## 2020-12-03 NOTE — Telephone Encounter (Signed)
Copied from CRM [id number]. Topic: General - Other >> Nov 27, 2020  4:21 PM Gwenlyn Fudge wrote: Reason for CRM: Pt called stating that he went to have his eyes checked. He states that he was ordered bifocals. Pt is requesting to know if he needs to bring in the paperwork from the visit or not. Please advise.

## 2020-12-03 NOTE — Telephone Encounter (Signed)
Patient is aware that office does not need paperwork.

## 2020-12-15 ENCOUNTER — Ambulatory Visit: Payer: Medicaid Other | Admitting: Podiatry

## 2021-01-09 ENCOUNTER — Other Ambulatory Visit: Payer: Self-pay

## 2021-01-09 MED FILL — Omeprazole Cap Delayed Release 20 MG: ORAL | 30 days supply | Qty: 30 | Fill #0 | Status: AC

## 2021-01-27 ENCOUNTER — Emergency Department (HOSPITAL_COMMUNITY)
Admission: EM | Admit: 2021-01-27 | Discharge: 2021-01-27 | Disposition: A | Discharge status: Home / Self Care | Payer: Medicaid Other | Attending: Emergency Medicine | Admitting: Emergency Medicine

## 2021-01-27 ENCOUNTER — Other Ambulatory Visit: Payer: Self-pay

## 2021-01-27 DIAGNOSIS — Z711 Person with feared health complaint in whom no diagnosis is made: Secondary | ICD-10-CM | POA: Diagnosis not present

## 2021-01-27 DIAGNOSIS — Z87891 Personal history of nicotine dependence: Secondary | ICD-10-CM | POA: Diagnosis not present

## 2021-01-27 NOTE — ED Triage Notes (Signed)
Pt reports being bit by several snakes 2.5 years ago and now the areas where he was bit are "stiff." Areas include R arm, face, chest, L leg, back.

## 2021-01-27 NOTE — ED Provider Notes (Signed)
[facility] EMERGENCY DEPARTMENT Provider Note  CSN: [id number] Arrival date & time: 01/27/21 1302    History Chief Complaint  Patient presents with   Snake Bite    HPI  HAMILTON MARINELLO is a 60 y.o. male presents to the ED requesting removal of snake fangs that have been in his skin since he was bitten by multiple snakes in 2018. He denies any acute complaints. Points to various areas of face, chest, leg, back etc as the locations of the fangs.    Past Medical History:  Diagnosis Date   Depression    GERD (gastroesophageal reflux disease)    Seasonal allergies     Past Surgical History:  Procedure Laterality Date   HERNIA REPAIR  10/16/2012   incarcerated   INGUINAL HERNIA REPAIR Left 10/16/2012   Procedure: HERNIA REPAIR INGUINAL ADULT;  Surgeon: Axel Filler, MD;  Location: [facility] OR;  Service: General;  Laterality: Left;    No family history on file.  Social History   Tobacco Use   Smoking status: Former    Packs/day: 1.00    Years: 30.00    Pack years: 30.00    Types: Cigarettes    Quit date: 02/20/2018    Years since quitting: 2.9   Smokeless tobacco: Never   Tobacco comments:    no cigarette in 14 days   Vaping Use   Vaping Use: Never used  Substance Use Topics   Alcohol use: No   Drug use: Yes    Types: Marijuana     Home Medications Prior to Admission medications   Medication Sig Start Date End Date Taking? Authorizing Provider  buPROPion (WELLBUTRIN XL) 150 MG 24 hr tablet Take 1 tablet (150 mg total) by mouth daily. For smoking cessation 05/16/20   Grayce Sessions, NP  EPINEPHrine (EPIPEN 2-PAK) 0.3 mg/0.3 mL IJ SOAJ injection Inject 0.3 mLs (0.3 mg total) into the muscle as needed for anaphylaxis. Use as directed, in event of severe allergic reaction Patient not taking: No sig reported 03/03/20   Henderly, Britni A, PA-C  EPINEPHrine 0.3 mg/0.3 mL IJ SOAJ injection Inject 0.3 mLs (0.3 mg total) into the muscle as needed for anaphylaxis.  03/03/20   Henderly, Britni A, PA-C  famotidine (PEPCID) 20 MG tablet Take 1 tablet (20 mg total) by mouth 2 (two) times daily. Patient not taking: No sig reported 03/03/20   Henderly, Britni A, PA-C  gabapentin (NEURONTIN) 300 MG capsule TAKE 2 CAPSULES (600 MG TOTAL) BY MOUTH 3 (THREE) TIMES DAILY. 11/14/20 11/14/21  Grayce Sessions, NP  omeprazole (PRILOSEC) 20 MG capsule TAKE 1 CAPSULE (20 MG TOTAL) BY MOUTH DAILY. 10/01/20 10/01/21  Grayce Sessions, NP  vitamin B-12 (CYANOCOBALAMIN) 100 MCG tablet Take 200 mcg by mouth daily.     [provider]     Allergies    Bee venom   Review of Systems   Review of Systems A comprehensive review of systems was completed and negative except as noted in HPI.    Physical Exam BP (!) 137/91 (BP Location: Right Arm)   Pulse 69   Temp 98.4 F (36.9 C) (Oral)   Resp 20   SpO2 96%   Physical Exam Vitals and nursing note reviewed.  HENT:     Head: Normocephalic.     Nose: Nose normal.  Eyes:     Extraocular Movements: Extraocular movements intact.  Pulmonary:     Effort: Pulmonary effort is normal.  Musculoskeletal:  General: Normal range of motion.     Cervical back: Neck supple.  Skin:    Findings: No rash (on exposed skin).     Comments: No evidence of FB in the areas identified by the patient.   Neurological:     Mental Status: He is alert and oriented to person, place, and time.  Psychiatric:        Mood and Affect: Mood normal.     ED Results / Procedures / Treatments   Labs (all labs ordered are listed, but only abnormal results are displayed) Labs Reviewed - No data to display  EKG None  Radiology No results found.  Procedures Procedures  Medications Ordered in the ED Medications - No data to display   MDM Rules/Calculators/A&P MDM Patient reassured that there does not appear to be any snake fangs in his skin. No signs of infection. Also advised that we typically do not remove cutaneous FB  this many years later anyway. Recommend regular PCP follow up.   ED Course  I have reviewed the triage vital signs and the nursing notes.  Pertinent labs & imaging results that were available during my care of the patient were reviewed by me and considered in my medical decision making (see chart for details).     Final Clinical Impression(s) / ED Diagnoses Final diagnoses:  Person with feared complaint, no diagnosis made    Rx / DC Orders ED Discharge Orders     None        Pollyann Savoy, MD 01/27/21 1642

## 2021-01-28 ENCOUNTER — Telehealth: Payer: Self-pay | Admitting: *Deleted

## 2021-01-28 NOTE — Telephone Encounter (Signed)
Transition Care Management Unsuccessful Follow-up Telephone Call  Date of discharge and from where:  01/27/2021 [facility]  Attempts:  1st Attempt  Reason for unsuccessful TCM follow-up call:  Unable to reach patient

## 2021-01-29 NOTE — Telephone Encounter (Signed)
Transition Care Management Unsuccessful Follow-up Telephone Call  Date of discharge and from where:  01/27/2021 - [name]  Attempts:  2nd Attempt  Reason for unsuccessful TCM follow-up call:  Unable to reach patient

## 2021-01-30 NOTE — Telephone Encounter (Signed)
Transition Care Management Unsuccessful Follow-up Telephone Call  Date of discharge and from where:  01/27/2021 [name]  Attempts:  3rd Attempt  Reason for unsuccessful TCM follow-up call:  Unable to leave message

## 2021-02-12 ENCOUNTER — Ambulatory Visit (INDEPENDENT_AMBULATORY_CARE_PROVIDER_SITE_OTHER): Payer: Medicaid Other | Admitting: Clinical

## 2021-02-12 ENCOUNTER — Encounter (INDEPENDENT_AMBULATORY_CARE_PROVIDER_SITE_OTHER): Payer: Self-pay | Admitting: Primary Care

## 2021-02-12 ENCOUNTER — Other Ambulatory Visit: Payer: Self-pay

## 2021-02-12 ENCOUNTER — Ambulatory Visit (INDEPENDENT_AMBULATORY_CARE_PROVIDER_SITE_OTHER): Payer: Medicaid Other | Admitting: Primary Care

## 2021-02-12 VITALS — BP 142/90 | HR 55 | Temp 98.2°F | Ht 74.0 in | Wt 163.4 lb

## 2021-02-12 DIAGNOSIS — F419 Anxiety disorder, unspecified: Secondary | ICD-10-CM | POA: Diagnosis not present

## 2021-02-12 DIAGNOSIS — Z09 Encounter for follow-up examination after completed treatment for conditions other than malignant neoplasm: Secondary | ICD-10-CM | POA: Diagnosis not present

## 2021-02-12 DIAGNOSIS — F29 Unspecified psychosis not due to a substance or known physiological condition: Secondary | ICD-10-CM

## 2021-02-12 DIAGNOSIS — F39 Unspecified mood [affective] disorder: Secondary | ICD-10-CM | POA: Diagnosis not present

## 2021-02-12 DIAGNOSIS — F1290 Cannabis use, unspecified, uncomplicated: Secondary | ICD-10-CM

## 2021-02-12 DIAGNOSIS — F69 Unspecified disorder of adult personality and behavior: Secondary | ICD-10-CM

## 2021-02-13 ENCOUNTER — Telehealth: Payer: Self-pay | Admitting: Clinical

## 2021-02-13 NOTE — BH Specialist Note (Signed)
Integrated Behavioral Health Initial In-Person Visit  MRN: [id number] Name: Logan Bailey  Number of Integrated Behavioral Health Clinician visits:: 1/6 Session Start time: 2:40PM  Session End time: 3:15PM Total time: 35  minutes  Types of Service: Individual psychotherapy  Interpretor:No. Interpretor Name and Language: N/A   Warm Hand Off Completed.         Subjective: Logan Bailey is a 60 y.o. male accompanied by  self Patient was referred by PCP [facility] for depression, anxiety, and psychotic features. Patient reports the following symptoms/concerns: Pt reports feeling anxious, on edge, decreased energy, depressed mood at times, difficulty sleeping, and fidgeting.  Duration of problem: 4+ years; Severity of problem: moderate  Objective: Mood: Anxious and Affect: Appropriate Risk of harm to self or others: No plan to harm self or others  Life Context: Family and Social: Pt reports limited support system. Reports that he does not speak to majority of his family. Reports that he is currently staying with his nephew. School/Work: Pt currently unemployed and receives SSI.  Self-Care: Reports marijuana use as coping skill. Reports that he spends a lot of time alone.  Life Changes: Pt reports that he was bit by several snakes in 2018 and he believes that the snakes fangs are still in his body. Reports that he can feel the snakes fangs roaming throughout his body. Reports that he feels them in his arms, stomach, legs, back, and his head. Also reports that he was stung by a bee and impacts his head. Pt reports that he did not use his EpiPen though his chart suggests an allergy to bees. Reports that he his bank account was hacked and received a call to pay back debts to the IRS. Reports that he was "promised" benefits from the phone call but never received them.   Patient and/or Family's Strengths/Protective Factors: Concrete supports in place (healthy food, safe environments, etc.),  Sense of purpose, and Physical Health (exercise, healthy diet, medication compliance, etc.)  Goals Addressed: Patient will: Reduce symptoms of: agitation, anxiety, depression, insomnia, and mood instability Increase knowledge and/or ability of: coping skills, healthy habits, and self-management skills  Demonstrate ability to: Increase healthy adjustment to current life circumstances and Increase adequate support systems for patient/family  Progress towards Goals: Ongoing  Interventions: Interventions utilized: Mindfulness or Management consultant, CBT Cognitive Behavioral Therapy, Supportive Counseling, Sleep Hygiene, Psychoeducation and/or Health Education, and Link to [employer]  Standardized Assessments completed: GAD-7 and PHQ 9  Patient and/or Family Response: Pt receptive to tx and plan.  Patient Centered Plan: Patient is on the following Treatment Plan(s):  Pt will be referred for psychiatry and outpatient therapy. Pt will also fu with LCSW.   Assessment: Pt presents with anxious mood with an appropriate affect. Pt exhibits tangential and pressured speech. Pt stated "sometimes I wonder if I should have came back in 2021". Pt reports that he "left" in 2018 after he was bitten by snakes and "came back" in 2021. Pt denies SI/HI. No safety risks. Pt endorses auditory hallucinations. Reports that he believes snake fangs are still roaming throughout his body after being bitten by "several" snakes in 2018. Pt also believes he was stung by a bee though pt chart suggests an allergy to bees and he did not use his EpiPen. Pt was evaluated by PCP and no evidence of bee sting was found. Pt also reports that his bank account was hacked and that he received a call about paying "IRS debts". Stated "I always answer my  spam calls cause you never know who it may be". Pt appears to be experiencing delusions with snake fangs roaming throughout his body and bee stings. Patient currently experiencing  psychosis with mood disturbances. Pt endorses having racing thoughts and difficulty concentrating.   Patient may benefit from psychiatry and outpatient therapy. Pt reports that he was seen at [facility] for several years and was taking Risperdal. Reports that he has not been seen by [facility] in at least a year. Pt acknowledges that medication assists his mood and hallucinations. LCSW encouraged pt to be mindful of answering spam calls due to the potential of bank information being stolen. LCSW will inform care coordinator so that pt can be referred as pt agreed to being seen by [facility] again. LCSW advised pt to utilize crisis resources and/or [facility] if needed. LCSW will fu with pt.   Plan: Follow up with behavioral health clinician on : 02/26/21 Behavioral recommendations: Utilize deep breathing exercises and await contact about referral for outpatient therapy and psychiatry. Utilize crisis resources and/or go to [facility] if needed.  Referral(s): Integrated Art gallery manager (In Clinic), Psychiatrist, and Counselor "From scale of 1-10, how likely are you to follow plan?": 10  Logan Mahurin C Aliyana Dlugosz, LCSW

## 2021-02-15 NOTE — Progress Notes (Signed)
[facility]   Subjective:   Logan Bailey is a 60 y.o. male presents for hospital follow up and establish care. Admit date to the hospital was 01/27/21, patient was discharged from the hospital on 01/27/21, patient was admitted for: dx Person with feared complaint, no diagnosis made.  Patient presented to the emergency room on 01/27/21 requesting removal of snake fangs that have been in his skin since he was bitten by multiple snakes in 2018. He denies any acute complaints. Points to various areas of face, chest, leg, back etc as the locations of the fangs.  Today's visit was similar to ED visit.  Unable to determine causes or problems other than behavioral health possible hallucination denies harm to self and others we will have him to speak with clinical social worker at this visit.    Current Outpatient Medications on File Prior to Visit  Medication Sig Dispense Refill   buPROPion (WELLBUTRIN XL) 150 MG 24 hr tablet Take 1 tablet (150 mg total) by mouth daily. For smoking cessation 90 tablet 1   EPINEPHrine 0.3 mg/0.3 mL IJ SOAJ injection Inject 0.3 mLs (0.3 mg total) into the muscle as needed for anaphylaxis. 1 each 0   gabapentin (NEURONTIN) 300 MG capsule TAKE 2 CAPSULES (600 MG TOTAL) BY MOUTH 3 (THREE) TIMES DAILY. 180 capsule 1   omeprazole (PRILOSEC) 20 MG capsule TAKE 1 CAPSULE (20 MG TOTAL) BY MOUTH DAILY. 30 capsule 3   vitamin B-12 (CYANOCOBALAMIN) 100 MCG tablet Take 200 mcg by mouth daily.      EPINEPHrine (EPIPEN 2-PAK) 0.3 mg/0.3 mL IJ SOAJ injection Inject 0.3 mLs (0.3 mg total) into the muscle as needed for anaphylaxis. Use as directed, in event of severe allergic reaction (Patient not taking: No sig reported) 1 each 0   famotidine (PEPCID) 20 MG tablet Take 1 tablet (20 mg total) by mouth 2 (two) times daily. (Patient not taking: No sig reported) 30 tablet 0   No current facility-administered medications on file prior to visit.     Review of System: Review  of Systems  All other systems reviewed and are negative.  Objective:  BP (!) 142/90 (BP Location: Right Arm, Patient Position: Sitting, Cuff Size: Normal)   Pulse (!) 55   Temp 98.2 F (36.8 C) (Temporal)   Ht 6\' 2"  (1.88 m)   Wt 163 lb 6.4 oz (74.1 kg)   SpO2 96%   BMI 20.98 kg/m   Filed Weights   02/12/21 1415  Weight: 163 lb 6.4 oz (74.1 kg)    Physical Exam:   General Appearance: Well nourished, in no apparent distress. Eyes: PERRLA, EOMs, conjunctiva no swelling or erythema Sinuses: No Frontal/maxillary tenderness ENT/Mouth: Ext aud canals clear, TMs without erythema, bulging. No erythema, swelling, or exudate on post pharynx.  Tonsils not swollen or erythematous. Hearing normal.  Neck: Supple, thyroid normal.  Respiratory: Respiratory effort normal, BS equal bilaterally without rales, rhonchi, wheezing or stridor.  Cardio: RRR with no MRGs. Brisk peripheral pulses without edema.  Abdomen: Soft, + BS.  Non tender, no guarding, rebound, hernias, masses. Lymphatics: Non tender without lymphadenopathy.  Musculoskeletal: Full ROM, 5/5 strength, normal gait.  Skin: Warm, dry without rashes, lesions, ecchymosis.  Neuro:  Normal muscle tone, no cerebellar symptoms. Sensation intact.  Psych: Awake and oriented X 3,  Assessment:   Logan Bailey was seen today for hospitalization follow-up.  Diagnoses and all orders for this visit:  Hospital discharge follow-up No recommendations from hospital discharge other than follow-up  with PCP  Adult behavior problems Patient to see clinical social worker at this visit.  This note has been created with Education officer, environmental. Any transcriptional errors are unintentional.   Grayce Sessions, NP 02/15/2021, 9:16 PM

## 2021-02-19 ENCOUNTER — Telehealth: Payer: Self-pay | Admitting: *Deleted

## 2021-02-19 NOTE — Telephone Encounter (Signed)
Patient is calling to confirm his upcoming appointment on 02/24/21.

## 2021-02-24 ENCOUNTER — Ambulatory Visit (INDEPENDENT_AMBULATORY_CARE_PROVIDER_SITE_OTHER): Payer: Medicaid Other | Admitting: Podiatry

## 2021-02-24 ENCOUNTER — Other Ambulatory Visit: Payer: Self-pay

## 2021-02-24 DIAGNOSIS — M79675 Pain in left toe(s): Secondary | ICD-10-CM

## 2021-02-24 DIAGNOSIS — M79672 Pain in left foot: Secondary | ICD-10-CM | POA: Diagnosis not present

## 2021-02-24 DIAGNOSIS — M79674 Pain in right toe(s): Secondary | ICD-10-CM | POA: Diagnosis not present

## 2021-02-24 DIAGNOSIS — Q828 Other specified congenital malformations of skin: Secondary | ICD-10-CM

## 2021-02-24 DIAGNOSIS — M79671 Pain in right foot: Secondary | ICD-10-CM

## 2021-02-24 DIAGNOSIS — B351 Tinea unguium: Secondary | ICD-10-CM | POA: Diagnosis not present

## 2021-02-26 ENCOUNTER — Other Ambulatory Visit: Payer: Self-pay

## 2021-02-26 ENCOUNTER — Ambulatory Visit (HOSPITAL_COMMUNITY)
Admission: EM | Admit: 2021-02-26 | Discharge: 2021-02-26 | Disposition: A | Discharge status: Other Acute Inpatient Hospital | Payer: Medicaid Other | Attending: Psychiatry | Admitting: Psychiatry

## 2021-02-26 ENCOUNTER — Emergency Department (HOSPITAL_COMMUNITY)
Admission: EM | Admit: 2021-02-26 | Discharge: 2021-02-26 | Disposition: A | Discharge status: Home / Self Care | Payer: Medicaid Other | Attending: Student | Admitting: Student

## 2021-02-26 ENCOUNTER — Ambulatory Visit (INDEPENDENT_AMBULATORY_CARE_PROVIDER_SITE_OTHER): Payer: Medicaid Other | Admitting: Clinical

## 2021-02-26 ENCOUNTER — Emergency Department (HOSPITAL_COMMUNITY): Payer: Medicaid Other

## 2021-02-26 DIAGNOSIS — R001 Bradycardia, unspecified: Secondary | ICD-10-CM | POA: Diagnosis not present

## 2021-02-26 DIAGNOSIS — R45850 Homicidal ideations: Secondary | ICD-10-CM | POA: Insufficient documentation

## 2021-02-26 DIAGNOSIS — F1290 Cannabis use, unspecified, uncomplicated: Secondary | ICD-10-CM

## 2021-02-26 DIAGNOSIS — G8929 Other chronic pain: Secondary | ICD-10-CM | POA: Diagnosis not present

## 2021-02-26 DIAGNOSIS — F29 Unspecified psychosis not due to a substance or known physiological condition: Secondary | ICD-10-CM

## 2021-02-26 DIAGNOSIS — R079 Chest pain, unspecified: Secondary | ICD-10-CM | POA: Insufficient documentation

## 2021-02-26 DIAGNOSIS — Z5321 Procedure and treatment not carried out due to patient leaving prior to being seen by health care provider: Secondary | ICD-10-CM | POA: Insufficient documentation

## 2021-02-26 DIAGNOSIS — F22 Delusional disorders: Secondary | ICD-10-CM

## 2021-02-26 DIAGNOSIS — I10 Essential (primary) hypertension: Secondary | ICD-10-CM | POA: Diagnosis not present

## 2021-02-26 DIAGNOSIS — R457 State of emotional shock and stress, unspecified: Secondary | ICD-10-CM | POA: Diagnosis not present

## 2021-02-26 LAB — CBC WITH DIFFERENTIAL/PLATELET
Abs Immature Granulocytes: 0.01 10*3/uL (ref 0.00–0.07)
Basophils Absolute: 0 10*3/uL (ref 0.0–0.1)
Basophils Relative: 0 %
Eosinophils Absolute: 0.1 10*3/uL (ref 0.0–0.5)
Eosinophils Relative: 1 %
HCT: 43 % (ref 39.0–52.0)
Hemoglobin: 13.6 g/dL (ref 13.0–17.0)
Immature Granulocytes: 0 %
Lymphocytes Relative: 35 %
Lymphs Abs: 2.2 10*3/uL (ref 0.7–4.0)
MCH: 29 pg (ref 26.0–34.0)
MCHC: 31.6 g/dL (ref 30.0–36.0)
MCV: 91.7 fL (ref 80.0–100.0)
Monocytes Absolute: 0.6 10*3/uL (ref 0.1–1.0)
Monocytes Relative: 9 %
Neutro Abs: 3.3 10*3/uL (ref 1.7–7.7)
Neutrophils Relative %: 55 %
Platelets: 297 10*3/uL (ref 150–400)
RBC: 4.69 MIL/uL (ref 4.22–5.81)
RDW: 13.1 % (ref 11.5–15.5)
WBC: 6.1 10*3/uL (ref 4.0–10.5)
nRBC: 0 % (ref 0.0–0.2)

## 2021-02-26 LAB — BASIC METABOLIC PANEL
Anion gap: 8 (ref 5–15)
BUN: 10 mg/dL (ref 6–20)
CO2: 27 mmol/L (ref 22–32)
Calcium: 9.4 mg/dL (ref 8.9–10.3)
Chloride: 106 mmol/L (ref 98–111)
Creatinine, Ser: 1.07 mg/dL (ref 0.61–1.24)
GFR, Estimated: >60 mL/min (ref >60)
Glucose, Bld: 76 mg/dL (ref 70–99)
Potassium: 3.9 mmol/L (ref 3.5–5.1)
Sodium: 141 mmol/L (ref 135–145)

## 2021-02-26 LAB — TROPONIN I (HIGH SENSITIVITY)
Troponin I (High Sensitivity): 15 ng/L (ref <18)
Troponin I (High Sensitivity): 16 ng/L (ref <18)

## 2021-02-26 MED ORDER — OLANZAPINE 5 MG PO TABS
5.0000 mg | ORAL_TABLET | Freq: Every day | ORAL | 0 refills | Status: DC
  Filled 2021-02-26 – 2021-03-02 (×3): qty 14, 14d supply, fill #0

## 2021-02-26 MED ORDER — DULOXETINE HCL 20 MG PO CPEP
20.0000 mg | ORAL_CAPSULE | Freq: Every day | ORAL | 0 refills | Status: DC
  Filled 2021-02-26 – 2021-02-27 (×2): qty 14, 14d supply, fill #0

## 2021-02-26 NOTE — ED Triage Notes (Signed)
Pt brought in by EMS from [facility] for left sided chest pain and bradycardia. Pt states the bradycardia is chronic.

## 2021-02-26 NOTE — ED Notes (Signed)
Patient stated that he needed to take his meds and left without being seen.
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
## 2021-02-26 NOTE — ED Provider Notes (Cosign Needed)
Emergency Medicine Provider Triage Evaluation Note  Logan Bailey , a 60 y.o. male  was evaluated in triage.  Pt complains of chest pain, low heart rates.  States this started today, comes and goes, states he has had in the past feels like similar events.  Denies hallucinations, delusions, suicidal or homicidal ideations. Coming from [facility]  Review of Systems  Positive: Chest pain bradycardia Negative: Hallucinations, delusions  Physical Exam  BP (!) 144/89 (BP Location: Left Arm)   Pulse (!) 44   Temp 98.5 F (36.9 C) (Oral)   Resp 18   Ht 6\' 2"  (1.88 m)   Wt 76.7 kg   SpO2 100%   BMI 21.70 kg/m  Gen:   Awake, no distress   Resp:  Normal effort  MSK:   Moves extremities without difficulty  Other:    Medical Decision Making  Medically screening exam initiated at 7:38 PM.  Appropriate orders placed.  Logan Bailey was informed that the remainder of the evaluation will be completed by another provider, this initial triage assessment does not replace that evaluation, and the importance of remaining in the ED until their evaluation is complete.  Presents with chest pain patient will need further work-up.   [name], PA-C 02/26/21 1940

## 2021-02-26 NOTE — Discharge Instructions (Addendum)
Patient to be discharged to [facility] ED for evaluation of heart rate.  Patient is instructed prior to discharge to: Take all medications as prescribed by his/her mental healthcare provider. Report any adverse effects and or reactions from the medicines to his/her outpatient provider promptly. Patient has been instructed & cautioned: To not engage in alcohol and or illegal drug use while on prescription medicines. In the event of worsening symptoms, patient is instructed to call the crisis hotline, 911 and or go to the nearest ED for appropriate evaluation and treatment of symptoms. To follow-up with his/her primary care provider for your other medical issues, concerns and or health care needs.
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
## 2021-02-27 ENCOUNTER — Encounter: Payer: Self-pay | Admitting: Podiatry

## 2021-02-27 ENCOUNTER — Other Ambulatory Visit: Payer: Self-pay

## 2021-02-27 NOTE — Progress Notes (Signed)
Subjective: Logan Bailey is a pleasant 60 y.o. male patient seen today for painful calluses b/l feet and painful thick toenails that are difficult to trim. Pain interferes with ambulation. Aggravating factors include wearing enclosed shoe gear. Pain is relieved with periodic professional debridement.  He voices no new pedal problems on today's visit.  PCP is Grayce Sessions, NP. Last visit was: 02/12/2021.  Allergies  Allergen Reactions   Bee Venom Anaphylaxis and Hives    Objective: Physical Exam  General: Logan Bailey is a pleasant 60 y.o. African American male, WD, WN in NAD. AAO x 3.   Vascular:  Capillary refill time to digits immediate b/l. Palpable pedal pulses b/l LE. Pedal hair sparse. Lower extremity skin temperature gradient within normal limits. No pain with calf compression b/l.  Dermatological:  Pedal skin with normal turgor, texture and tone b/l lower extremities. No open wounds b/l lower extremities. No interdigital macerations b/l lower extremities. Toenails 1-5 b/l elongated, discolored, dystrophic, thickened, crumbly with subungual debris and tenderness to dorsal palpation. Porokeratotic lesion(s) submet head 1 left foot, submet head 1 right foot, submet head 5 left foot, and submet head 5 right foot. No erythema, no edema, no drainage, no fluctuance.  Musculoskeletal:  Normal muscle strength 5/5 to all lower extremity muscle groups bilaterally. No pain crepitus or joint limitation noted with ROM b/l. Hallux valgus with bunion deformity noted b/l lower extremities.  Neurological:  Protective sensation intact 5/5 intact bilaterally with 10g monofilament b/l. Vibratory sensation intact b/l. Proprioception intact bilaterally.  Assessment and Plan:  1. Pain due to onychomycosis of toenails of both feet   2. Porokeratosis   3. Pain in both feet      -No new findings. No new orders. -Medicaid ABN signed for this year. Patient consents for services of paring  of porokeratoses today. Copy has been placed in patient chart. -Patient to continue soft, supportive shoe gear daily. -Toenails 1-5 b/l were debrided in length and girth with sterile nail nippers and dremel without iatrogenic bleeding.  -Painful porokeratotic lesion(s) submet head 1 left foot, submet head 1 right foot, submet head 5 left foot, and submet head 5 right foot pared and enucleated with sterile scalpel blade without incident. Total number of lesions debrided=4. -Patient to report any pedal injuries to medical professional immediately. -Patient/POA to call should there be question/concern in the interim.  Return in about 3 months (around 05/27/2021).  Freddie Breech, DPM

## 2021-03-02 ENCOUNTER — Other Ambulatory Visit: Payer: Self-pay

## 2021-03-06 ENCOUNTER — Other Ambulatory Visit: Payer: Self-pay

## 2021-03-09 ENCOUNTER — Other Ambulatory Visit: Payer: Self-pay

## 2021-03-12 ENCOUNTER — Ambulatory Visit (INDEPENDENT_AMBULATORY_CARE_PROVIDER_SITE_OTHER): Payer: Medicaid Other | Admitting: Clinical

## 2021-03-12 ENCOUNTER — Other Ambulatory Visit: Payer: Self-pay

## 2021-03-12 DIAGNOSIS — F22 Delusional disorders: Secondary | ICD-10-CM | POA: Diagnosis not present

## 2021-03-12 DIAGNOSIS — F419 Anxiety disorder, unspecified: Secondary | ICD-10-CM | POA: Diagnosis not present
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
## 2021-03-18 NOTE — Telephone Encounter (Signed)
[facility] confirmed on 03/18/21 they are not accepting new patient for the ACTT Team.

## 2021-03-25 ENCOUNTER — Telehealth: Payer: Self-pay

## 2021-03-25 NOTE — Telephone Encounter (Signed)
Patients referral for [facility] faxed on 03/25/21.

## 2021-03-31 NOTE — Telephone Encounter (Signed)
I have placed a call to [facility], no answer, unable to leave vm. I attempted to call [facility] and was able to leave a voicemail. Hoping they will call back.

## 2021-04-02 ENCOUNTER — Ambulatory Visit (INDEPENDENT_AMBULATORY_CARE_PROVIDER_SITE_OTHER): Payer: Medicaid Other | Admitting: Clinical

## 2021-04-02 ENCOUNTER — Other Ambulatory Visit: Payer: Self-pay

## 2021-04-02 DIAGNOSIS — F419 Anxiety disorder, unspecified: Secondary | ICD-10-CM

## 2021-04-02 DIAGNOSIS — F22 Delusional disorders: Secondary | ICD-10-CM | POA: Diagnosis not present

## 2021-04-03 NOTE — BH Specialist Note (Signed)
Integrated Behavioral Health Follow Up In-Person Visit  MRN: [id number] Name: Logan Bailey  Number of Integrated Behavioral Health Clinician visits: 4/6 Session Start time: 2:29pm  Session End time: 3:09pm Total time: 40  minutes  Types of Service: Individual psychotherapy  Interpretor:No. Interpretor Name and Language: N/A  Subjective: OLVIN ROHR is a 60 y.o. male accompanied by  self Patient was referred by PCP [facility] for psychotic features, depression, and anxiety. Patient reports the following symptoms/concerns: Pt reports feeling anxious at times, excessive worrying, and continued difficulty socializing with others. Reports that he has snake fangs roaming through his body. Reports that the process has started where the snake fangs are starting to come out of his body.  Duration of problem: 4+ years; Severity of problem: severe  Objective: Mood: Anxious and Affect: Appropriate Risk of harm to self or others: No plan to harm self or others  Life Context: Family and Social: Pt reports limited support system. Reports that he does not speak to majority of his family. Reports that he is currently staying with his nephew. School/Work: Pt currently unemployed and receives SSI. Reports that he recently was approved for SSDI and is waiting for his new income to begin. Self-Care: Reports marijuana use as coping skill. Reports playing games on his phone as coping skill. Reports that he spends a lot of time alone.  Life Changes: Pt reports that he was bit by several snakes in 2018 and he believes that the snakes fangs are still in his body. Reports that he can feel the snakes fangs roaming throughout his body. Reports that he feels them in his arms, stomach, legs, back, and his head. Reports that the snake fangs are starting to come out of his body. Also reports that he was stung by a bee and impacts his head. Pt reports that he did not use his EpiPen though his chart suggests an allergy  to bees. Reports that he his bank account was hacked and received a call to pay back debts to the IRS. Reports that he was "promised" benefits from the phone call but never received them. Pt continues to experience these concerns. Reports that he continues to no longer be "bothered" by the police officer and does not experience HI. Pt reports that his apartment lease is ending in four months and he plans to search for new housing for him to live alone.  Patient and/or Family's Strengths/Protective Factors: Concrete supports in place (healthy food, safe environments, etc.), Sense of purpose, and Physical Health (exercise, healthy diet, medication compliance, etc.)  Goals Addressed: Patient will:  Reduce symptoms of: agitation, compulsions, mood instability, and stress   Increase knowledge and/or ability of: coping skills, healthy habits, self-management skills, and stress reduction   Demonstrate ability to: Increase healthy adjustment to current life circumstances and Increase adequate support systems for patient/family  Progress towards Goals: Ongoing  Interventions: Interventions utilized:  Mindfulness or Management consultant, CBT Cognitive Behavioral Therapy, Supportive Counseling, and Link to [employer] Standardized Assessments completed: GAD-7 and PHQ 9 Flowsheet Row Integrated Behavioral Health from 04/02/2021 in [facility]  PHQ-9 Total Score 9       GAD 7 : Generalized Anxiety Score 04/02/2021 02/12/2021 10/01/2020 03/31/2020  Nervous, Anxious, on Edge 2 1 1 1   Control/stop worrying 0 0 0 0  Worry too much - different things 2 0 0 1  Trouble relaxing 2 1 1  0  Restless 2 1 0 0  Easily annoyed or irritable 0  1 0 0  Afraid - awful might happen 2 1 0 1  Total GAD 7 Score 10 5 2 3   Anxiety Difficulty - Somewhat difficult Not difficult at all -     Patient and/or Family Response: Pt receptive to tx. Pt receptive to psychoeducation provided on anxiety. Pt  receptive to cognitive restructuring in order to decrease pt worries with socializing. Pt will continue growing his relationship with his nephew. Pt will continue utilizing deep breathing exercises and playing games on phone as healthy coping skills.  Patient Centered Plan: Patient is on the following Treatment Plan(s): anxiety, agitation, and psychosis  Assessment: Denies SI/HI. Denies auditory/visual hallucinations. Patient currently experiencing delusions. Pt appears to continue believing snake fangs are roaming through his body and are now beginning to come out of his body. Pt also appears to experience agitation when thinking about socialization.   Patient may benefit from increasing interaction with nephew. Pt may also benefit from cognitive restructuring and to identify cognitive distortions associated with socializing. LCSWA provided psychoeducation on anxiety. Pt has been referred to [facility] for an ACTT team. LCSWA will fu with pt and encouraged pt to await contact from [facility].  Plan: Follow up with behavioral health clinician on : 04/30/21 Behavioral recommendations: Utilize deep breathing exercises, continue playing games on phone as healthy coping skills, increase socialization with nephew Referral(s): Integrated 05/02/21 (In Clinic) and Art gallery manager:  Housing "From scale of 1-10, how likely are you to follow plan?": 10  Sadaf Przybysz C Bradey Luzier, LCSW

## 2021-04-14 ENCOUNTER — Other Ambulatory Visit: Payer: Self-pay

## 2021-04-15 ENCOUNTER — Telehealth: Payer: Self-pay

## 2021-04-15 NOTE — Telephone Encounter (Signed)
Following up on the referral.

## 2021-04-17 NOTE — Telephone Encounter (Signed)
Thank you :)

## 2021-04-30 ENCOUNTER — Ambulatory Visit (INDEPENDENT_AMBULATORY_CARE_PROVIDER_SITE_OTHER): Payer: Medicaid Other | Admitting: Clinical

## 2021-05-07 ENCOUNTER — Ambulatory Visit (INDEPENDENT_AMBULATORY_CARE_PROVIDER_SITE_OTHER): Payer: Medicaid Other | Admitting: Clinical

## 2021-05-07 ENCOUNTER — Other Ambulatory Visit: Payer: Self-pay

## 2021-05-07 DIAGNOSIS — F22 Delusional disorders: Secondary | ICD-10-CM | POA: Diagnosis not present

## 2021-05-12 NOTE — BH Specialist Note (Signed)
Integrated Behavioral Health Follow Up In-Person Visit  MRN: [id number] Name: Logan Bailey  Number of Integrated Behavioral Health Clinician visits: 5/6 Session Start time: 2:35pm  Session End time: 3:05pm Total time: 30 minutes  Types of Service: Individual psychotherapy  Interpretor:No. Interpretor Name and Language: N/A  Subjective: Logan Bailey is a 60 y.o. male accompanied by  self Patient was referred by Gwinda Passe, NP for psychotic features, depression, and anxiety. Patient reports the following symptoms/concerns: Pt reports feeling anxious and worried at times. Reports that he has been approved for SSDI and is experiencing worries due to not receiving his new income as he already had SSI. Reports that he continues to feel like snake fangs are roaming through his body. Reports having difficulty trusting people due to feeling like they are going to "create problems".  Duration of problem: 4+ years; Severity of problem: moderate  Objective: Mood: Anxious and Affect: Appropriate Risk of harm to self or others: No plan to harm self or others  Life Context: Family and Social: Pt reports limited support system. Reports that he does not speak to majority of his family. Reports that he is currently staying with his nephew. Reports that he wants to get his own place. School/Work: Pt currently unemployed and receives SSI. Reports that he recently was approved for SSDI and is waiting for his new income to begin. Reports that he was supposed to already have received his SSDI.  Self-Care: Reports marijuana use as coping skill. Reports playing games on his phone as coping skill. Reports that he spends a lot of time alone.  Life Changes: Pt reports that he was bit by several snakes in 2018 and he believes that the snakes fangs are still in his body. Reports that he can feel the snakes fangs roaming throughout his body. Reports that he feels them in his arms, stomach, legs, back, and his  head. Reports that the snake fangs are starting to come out of his body. Also reports that he was stung by a bee and impacts his head. Pt reports that he did not use his EpiPen though his chart suggests an allergy to bees. Reports that he his bank account was hacked and received a call to pay back debts to the IRS. Reports that he was "promised" benefits from the phone call but never received them. Pt continues to experience these concerns. Reports that he continues to no longer be "bothered" by the police officer and does not experience HI. Pt reports that his apartment lease is ending in four months and he plans to search for new housing for him to live alone.  Patient and/or Family's Strengths/Protective Factors: Concrete supports in place (healthy food, safe environments, etc.), Sense of purpose, and Physical Health (exercise, healthy diet, medication compliance, etc.)  Goals Addressed: Patient will:  Reduce symptoms of: agitation, mood instability, and stress   Increase knowledge and/or ability of: coping skills and healthy habits   Demonstrate ability to: Increase healthy adjustment to current life circumstances and Increase adequate support systems for patient/family  Progress towards Goals: Revised and Ongoing  Interventions: Interventions utilized:  Mindfulness or Relaxation Training, CBT Cognitive Behavioral Therapy, and Supportive Counseling Standardized Assessments completed: Not Needed  Patient and/or Family Response: Pt receptive to tx. Pt receptive to cognitive restructuring utilized to improve pt's cognitive processing skills related to trusting people. Pt receptive to utilizing deep breathing exercises. Pt receptive to supportive counseling due to pt wanting to discuss concerns with SSDI.  Patient Centered  Plan: Patient is on the following Treatment Plan(s): anxiety, agitation, and psychosis  Assessment: Denies SI/HI. Denies auditory/visual hallucinations. Patient currently  experiencing delusions and paranoia. Pt continues to believe snake fangs are roaming through his body. Pt appears to experience paranoia which contributes to pt having difficulty with socialization and trusting people. Pt has had difficulty improving his relationship with his nephew. Pt was anxious during session.   Patient may benefit from beginning psychiatry. Pt has appt scheduled with [facility] for psychiatry. Pt would also benefit from CBT to assist with cognitive processing skills. LCSWA utilized Chartered certified accountant. LCSWA encouraged pt to follow up with DSS for concerns with SSDI.Marland Kitchen  Plan: Follow up with behavioral health clinician on : 05/28/21 Behavioral recommendations: Utilize deep breathing exercises, continue playing games with people, attend appointment for psychiatry with [facility] Referral(s): Integrated [employer] (In Clinic) and Psychiatrist "From scale of 1-10, how likely are you to follow plan?": 10  Donyel Castagnola C Reef Achterberg, LCSW

## 2021-05-27 ENCOUNTER — Ambulatory Visit: Payer: Medicaid Other | Admitting: Podiatry

## 2021-05-27 ENCOUNTER — Other Ambulatory Visit: Payer: Self-pay

## 2021-05-27 DIAGNOSIS — M79674 Pain in right toe(s): Secondary | ICD-10-CM

## 2021-05-27 DIAGNOSIS — M79672 Pain in left foot: Secondary | ICD-10-CM

## 2021-05-27 DIAGNOSIS — M79675 Pain in left toe(s): Secondary | ICD-10-CM | POA: Diagnosis not present

## 2021-05-27 DIAGNOSIS — Q828 Other specified congenital malformations of skin: Secondary | ICD-10-CM

## 2021-05-27 DIAGNOSIS — M79671 Pain in right foot: Secondary | ICD-10-CM | POA: Diagnosis not present

## 2021-05-27 DIAGNOSIS — B351 Tinea unguium: Secondary | ICD-10-CM | POA: Diagnosis not present

## 2021-05-28 ENCOUNTER — Ambulatory Visit (INDEPENDENT_AMBULATORY_CARE_PROVIDER_SITE_OTHER): Payer: Medicaid Other | Admitting: Clinical

## 2021-05-28 DIAGNOSIS — F22 Delusional disorders: Secondary | ICD-10-CM

## 2021-06-02 ENCOUNTER — Encounter: Payer: Self-pay | Admitting: Podiatry

## 2021-06-02 NOTE — Progress Notes (Signed)
  Subjective:  Patient ID: Logan Bailey, male    DOB: 1960-12-16,  MRN: [id number]  [name] Ma presents to clinic today for painful porokeratotic lesion(s) b/l feet and painful mycotic toenails that limit ambulation. Painful toenails interfere with ambulation. Aggravating factors include wearing enclosed shoe gear. Pain is relieved with periodic professional debridement. Painful porokeratotic lesions are aggravated when weightbearing with and without shoegear. Pain is relieved with periodic professional debridement.  Mr. Boule relates no new pedal problems on today's visit.  PCP is Grayce Sessions, NP , and last visit was 02/12/2021.  Allergies  Allergen Reactions   Bee Venom Anaphylaxis and Hives    Review of Systems: Negative except as noted in the HPI. Objective:   Constitutional Alesandro Stueve Jindra is a pleasant 60 y.o. African American male, WD, WN in NAD. AAO x 3.   Vascular Capillary refill time to digits immediate b/l. Palpable pedal pulses b/l LE. Pedal hair absent. Lower extremity skin temperature gradient within normal limits. No pain with calf compression RLE. No edema noted b/l lower extremities. No cyanosis or clubbing noted.  Neurologic Normal speech. Oriented to person, place, and time. Protective sensation intact 5/5 intact bilaterally with 10g monofilament b/l. Vibratory sensation intact b/l. Proprioception intact bilaterally.  Dermatologic Pedal skin is warm and supple b/l LE. No open wounds b/l LE. No interdigital macerations noted b/l LE. Toenails 1-5 b/l elongated, discolored, dystrophic, thickened, crumbly with subungual debris and tenderness to dorsal palpation. Porokeratotic lesion(s) submet head 1 b/l and submet head 5 b/l. No erythema, no edema, no drainage, no fluctuance.  Orthopedic: Normal muscle strength 5/5 to all lower extremity muscle groups bilaterally. HAV with bunion deformity noted b/l LE.   Radiographs: None Assessment:   1. Pain due to  onychomycosis of toenails of both feet   2. Porokeratosis   3. Pain in both feet    Plan:  Patient was evaluated and treated and all questions answered. Consent given for treatment as described below: -Examined patient. -Medicaid ABN signed for this year. Patient consents for services of paring of porokeratoses  today. Copy has been placed in patient chart. -Patient to continue soft, supportive shoe gear daily. -Toenails 1-5 b/l were debrided in length and girth with sterile nail nippers and dremel without iatrogenic bleeding.  -Patient to report any pedal injuries to medical professional immediately. -Patient/POA to call should there be question/concern in the interim.  Return in about 3 months (around 08/27/2021).  Freddie Breech, DPM

## 2021-06-05 ENCOUNTER — Telehealth: Payer: Self-pay | Admitting: Clinical

## 2021-06-05 NOTE — Telephone Encounter (Signed)
LCSWA contacted pt to see if he attended appt with [facility] on 06/01/21. Pt mentioned that he did not go due to transportation. LCSWA contacted [facility] to assist pt in rescheduling appt. Pt's appt is scheduled for 06/11/21 at 12:00pm. Pt mentioned that he will take the bus to his appt.

## 2021-06-05 NOTE — BH Specialist Note (Signed)
Integrated Behavioral Health Follow Up In-Person Visit  MRN: [id number] Name: Logan Bailey  Number of Integrated Behavioral Health Clinician visits: 6/6 Session Start time: 2:40pm  Session End time: 3:10pm Total time: 30 minutes  Types of Service: Individual psychotherapy  Interpretor:No. Interpretor Name and Language: N/A  Subjective: Logan Bailey is a 60 y.o. male accompanied by  self Patient was referred by PCP Gwinda Passe, NP for psychotic features, depression, and anxiety. Patient reports the following symptoms/concerns: Reports feeling anxious and worried at times. Reports frustrations with still not receiving his SSDI and only receiving his SSI. Reports that he is noticing that the "snake fangs" are starting to leave his body. Also reports frustrations with continuing to have to stay with his nephew and wanting to move out.  Duration of problem: 4+ years; Severity of problem: moderate  Objective: Mood: Anxious and Affect: Appropriate Risk of harm to self or others: No plan to harm self or others  Life Context: Family and Social: Pt reports limited support system. Reports that he does not speak to majority of his family. Reports that he is currently staying with his nephew. Reports that he wants to get his own place. School/Work: Pt currently unemployed and receives SSI. Reports that he recently was approved for SSDI and is waiting for his new income to begin. Reports that he was supposed to already have received his SSDI.  Self-Care: Reports marijuana use as coping skill. Reports playing games on his phone as coping skill. Reports that he spends a lot of time alone.  Life Changes: Pt reports that he was bit by several snakes in 2018 and he believes that the snakes fangs are still in his body. Reports that he can feel the snakes fangs roaming throughout his body. Reports that he feels them in his arms, stomach, legs, back, and his head. Reports that the snake fangs are  starting to come out of his body. Also reports that he was stung by a bee and impacts his head. Pt reports that he did not use his EpiPen though his chart suggests an allergy to bees. Reports that he his bank account was hacked and received a call to pay back debts to the IRS. Reports that he was "promised" benefits from the phone call but never received them. Pt continues to experience these concerns. Reports that he continues to no longer be "bothered" by the police officer and does not experience HI. Pt reports that his apartment lease is ending in four months and he plans to search for new housing for him to live alone. (No changes to life context)  Patient and/or Family's Strengths/Protective Factors: Concrete supports in place (healthy food, safe environments, etc.), Sense of purpose, and Physical Health (exercise, healthy diet, medication compliance, etc.)  Goals Addressed: Patient will:  Reduce symptoms of: agitation, mood instability, and stress   Increase knowledge and/or ability of: coping skills and healthy habits   Demonstrate ability to: Increase healthy adjustment to current life circumstances and Increase adequate support systems for patient/family  Progress towards Goals: Ongoing  Interventions: Interventions utilized:  Mindfulness or Management consultant, CBT Cognitive Behavioral Therapy, Supportive Counseling, and Psychoeducation and/or Health Education Standardized Assessments completed: Not Needed  Patient and/or Family Response: Pt receptive to tx. Pt receptive to psychoeducation provided on anxiety. Pt receptive to cognitive restructuring to decrease negative thoughts. Pt had difficulty with cognitive processing. Pt receptive to supportive counseling as pt expressed concerns with SSDI. Pt receptive to utilizing deep breathing exercises and  continuing healthy coping skills with playing games on phone.  Patient Centered Plan: Patient is on the following Treatment Plan(s):  anxiety, agitation, and psychosis  Assessment: Denies SI/HI. Denies auditory/visual hallucinations. Patient currently experiencing delusions and paranoia. Pt continues to believe snake fangs are leaving his body. Pt has difficulty with maintaining relationships as he gets frustrated with his nephew easily and continues to have difficulty improving their relationship. Pt also has difficulty with cognitive processing skills.  Patient may benefit from beginning psychiatry services. Pt has appt scheduled with [facility] on 06/01/21 as he missed his previous appt. LCSWA provided psychoeducation on anxiety. LCSWA utilized Chartered certified accountant. LCSWA encouraged pt to attend appt. LCSWA encouraged pt to utilize deep breathing exercises and continue healthy coping skill with playing games on phone. LCSWA will fu with pt to ensure he attends appt.   Plan: Follow up with behavioral health clinician on : 06/25/21 Behavioral recommendations: Attend appt with [facility] for psychiatry, utilize deep breathing exercises, and continue healthy coping skill with playing games on phone. Referral(s): Integrated [employer] (In Clinic) and Psychiatrist "From scale of 1-10, how likely are you to follow plan?": 10  Simmone Cape C Edinson Domeier, LCSW

## 2021-06-12 ENCOUNTER — Emergency Department (HOSPITAL_COMMUNITY)
Admission: EM | Admit: 2021-06-12 | Discharge: 2021-06-13 | Disposition: A | Discharge status: Home / Self Care | Payer: Medicaid Other | Attending: Emergency Medicine | Admitting: Emergency Medicine

## 2021-06-12 ENCOUNTER — Encounter (HOSPITAL_COMMUNITY): Payer: Self-pay

## 2021-06-12 DIAGNOSIS — M25511 Pain in right shoulder: Secondary | ICD-10-CM | POA: Diagnosis not present

## 2021-06-12 DIAGNOSIS — R0789 Other chest pain: Secondary | ICD-10-CM | POA: Insufficient documentation

## 2021-06-12 DIAGNOSIS — Z5321 Procedure and treatment not carried out due to patient leaving prior to being seen by health care provider: Secondary | ICD-10-CM | POA: Diagnosis not present

## 2021-06-12 DIAGNOSIS — R079 Chest pain, unspecified: Secondary | ICD-10-CM | POA: Diagnosis not present

## 2021-06-12 NOTE — ED Triage Notes (Signed)
Pt transported from home by [facility] for c/o R chest wall pain onset this am, worse with palpation and movement, denies injury. Denies cardiac hx, no IV, 139/90, 67, 98% RA, NAd.

## 2021-06-12 NOTE — ED Notes (Signed)
Pt decided to go home 

## 2021-06-12 NOTE — ED Provider Notes (Signed)
Emergency Medicine Provider Triage Evaluation Note  Logan Bailey , a 60 y.o. male  was evaluated in triage.  Pt complains of right side/shoulder pain.  Has been going on today.  Worse with movement.  No fall or injury.  Review of Systems  Positive: R shoulder pain Negative: numbness  Physical Exam  BP (!) 140/97   Pulse (!) 58   Temp 99.4 F (37.4 C) (Oral)   Resp 18   SpO2 100%  Gen:   Awake, no distress   Resp:  Normal effort  MSK:   Tenderness palpation of the right shoulder.  Full active range of motion of the upper extremities.  Radial pulses 2+ bilaterally.  Grip strength equal bilaterally   Medical Decision Making  Medically screening exam initiated at 8:58 PM.  Appropriate orders placed.  Logan Bailey was informed that the remainder of the evaluation will be completed by another provider, this initial triage assessment does not replace that evaluation, and the importance of remaining in the ED until their evaluation is complete.     Logan Apley, PA-C 06/12/21 2100    Logan Leiter, DO 06/12/21 2150

## 2021-06-15 ENCOUNTER — Telehealth: Payer: Self-pay

## 2021-06-15 DIAGNOSIS — Z789 Other specified health status: Secondary | ICD-10-CM

## 2021-06-15 NOTE — Telephone Encounter (Signed)
Transition Care Management Unsuccessful Follow-up Telephone Call  Date of discharge and from where:  06/13/2021-[place]  Attempts:  1st Attempt  Reason for unsuccessful TCM follow-up call:  Left voice message    

## 2021-06-16 NOTE — Telephone Encounter (Signed)
Transition Care Management Follow-up Telephone Call Date of discharge and from where: 06/12/2021 from [place]  How have you been since you were released from the hospital? Pt stated  Any questions or concerns? No  Items Reviewed: Did the pt receive and understand the discharge instructions provided? Yes  Medications obtained and verified? Yes  Other? No  Any new allergies since your discharge? No  Dietary orders reviewed? No Do you have support at home? Yes   Functional Questionnaire: (I = Independent and D = Dependent) ADLs: I  Bathing/Dressing- I  Meal Prep- I  Eating- I  Maintaining continence- I  Transferring/Ambulation- I  Managing Meds- I   Follow up appointments reviewed:  PCP Hospital f/u appt confirmed? No   Specialist Hospital f/u appt confirmed? No   Are transportation arrangements needed? Yes  If their condition worsens, is the pt aware to call PCP or go to the Emergency Dept.? Yes Was the patient provided with contact information for the PCP's office or ED? Yes Was to pt encouraged to call back with questions or concerns? Yes

## 2021-06-16 NOTE — Telephone Encounter (Signed)
Transition Care Management Unsuccessful Follow-up Telephone Call  Date of discharge and from where:  06/13/2021 from [place]  Attempts:  2nd Attempt  Reason for unsuccessful TCM follow-up call:  Left voice message    

## 2021-06-17 ENCOUNTER — Other Ambulatory Visit: Payer: Self-pay

## 2021-06-17 MED ORDER — GABAPENTIN 300 MG PO CAPS
ORAL_CAPSULE | ORAL | 2 refills | Status: DC
  Filled 2021-06-17: qty 90, 30d supply, fill #0
  Filled 2021-08-05: qty 90, 30d supply, fill #1

## 2021-06-18 ENCOUNTER — Telehealth: Payer: Self-pay | Admitting: *Deleted

## 2021-06-18 NOTE — Telephone Encounter (Signed)
   Telephone encounter was:  Unsuccessful.  06/18/2021 Name: Logan Bailey MRN: [id number] DOB: 02/18/1961  Unsuccessful outbound call made today to assist with:  Transportation Needs  and Food Insecurity  Outreach Attempt:  1st Attempt  A HIPAA compliant voice message was left requesting a return call.  Instructed patient to call back at   Instructed patient to call back at [phone]  at their earliest convenience. Yehuda Mao Greenauer -[facility] Guide , Embedded Care Coordination [facility], Care Management  [phone] [address] [place] , [place] [address] Email : [email or web address]

## 2021-06-25 ENCOUNTER — Ambulatory Visit (INDEPENDENT_AMBULATORY_CARE_PROVIDER_SITE_OTHER): Payer: Medicaid Other | Admitting: Clinical

## 2021-07-09 ENCOUNTER — Ambulatory Visit (INDEPENDENT_AMBULATORY_CARE_PROVIDER_SITE_OTHER): Payer: Medicaid Other | Admitting: Clinical

## 2021-07-10 ENCOUNTER — Telehealth: Payer: Self-pay | Admitting: *Deleted

## 2021-07-10 ENCOUNTER — Encounter (INDEPENDENT_AMBULATORY_CARE_PROVIDER_SITE_OTHER): Payer: Self-pay | Admitting: Primary Care

## 2021-07-10 ENCOUNTER — Other Ambulatory Visit: Payer: Self-pay

## 2021-07-10 ENCOUNTER — Ambulatory Visit (INDEPENDENT_AMBULATORY_CARE_PROVIDER_SITE_OTHER): Payer: Medicaid Other | Admitting: Primary Care

## 2021-07-10 VITALS — BP 132/85 | HR 56 | Temp 98.2°F | Ht 74.0 in | Wt 174.2 lb

## 2021-07-10 DIAGNOSIS — M25511 Pain in right shoulder: Secondary | ICD-10-CM | POA: Diagnosis not present

## 2021-07-10 DIAGNOSIS — G8929 Other chronic pain: Secondary | ICD-10-CM | POA: Diagnosis not present

## 2021-07-10 DIAGNOSIS — F29 Unspecified psychosis not due to a substance or known physiological condition: Secondary | ICD-10-CM

## 2021-07-10 DIAGNOSIS — Z09 Encounter for follow-up examination after completed treatment for conditions other than malignant neoplasm: Secondary | ICD-10-CM

## 2021-07-10 DIAGNOSIS — Z23 Encounter for immunization: Secondary | ICD-10-CM

## 2021-07-10 NOTE — Progress Notes (Deleted)
[facility]   Subjective:   Logan Bailey is a 60 y.o. male presents for hospital follow up and establish care. Admit date to the hospital was 06/12/21, patient was discharged from the hospital on 06/13/21, patient was admitted for:   Past Medical History:  Diagnosis Date   Depression    GERD (gastroesophageal reflux disease)    Seasonal allergies      Allergies  Allergen Reactions   Bee Venom Anaphylaxis and Hives      Current Outpatient Medications on File Prior to Visit  Medication Sig Dispense Refill   EPINEPHrine 0.3 mg/0.3 mL IJ SOAJ injection Inject 0.3 mLs (0.3 mg total) into the muscle as needed for anaphylaxis. 1 each 0   gabapentin (NEURONTIN) 300 MG capsule take 1 capsule by mouth three times daily 90 capsule 2   omeprazole (PRILOSEC) 20 MG capsule TAKE 1 CAPSULE (20 MG TOTAL) BY MOUTH DAILY. 30 capsule 3   vitamin B-12 (CYANOCOBALAMIN) 100 MCG tablet Take 200 mcg by mouth daily.      DULoxetine (CYMBALTA) 20 MG capsule Take 1 capsule (20 mg total) by mouth daily for 14 days. 14 capsule 0   OLANZapine (ZYPREXA) 5 MG tablet Take 1 tablet (5 mg total) by mouth at bedtime for 14 days. 14 tablet 0   [DISCONTINUED] buPROPion (WELLBUTRIN XL) 150 MG 24 hr tablet Take 1 tablet (150 mg total) by mouth daily. For smoking cessation 90 tablet 1   [DISCONTINUED] famotidine (PEPCID) 20 MG tablet Take 1 tablet (20 mg total) by mouth 2 (two) times daily. (Patient not taking: No sig reported) 30 tablet 0   No current facility-administered medications on file prior to visit.     Review of System: ROS  Objective:  BP 132/85 (BP Location: Right Arm, Patient Position: Sitting, Cuff Size: Normal)   Pulse (!) 56   Temp 98.2 F (36.8 C) (Temporal)   Ht 6\' 2"  (1.88 m)   Wt 174 lb 3.2 oz (79 kg)   SpO2 98%   BMI 22.37 kg/m   Filed Weights   07/10/21 0929  Weight: 174 lb 3.2 oz (79 kg)    Physical Exam:   General Appearance: Well nourished, in no apparent  distress. Eyes: PERRLA, EOMs, conjunctiva no swelling or erythema Sinuses: No Frontal/maxillary tenderness ENT/Mouth: Ext aud canals clear, TMs without erythema, bulging. No erythema, swelling, or exudate on post pharynx.  Tonsils not swollen or erythematous. Hearing normal.  Neck: Supple, thyroid normal.  Respiratory: Respiratory effort normal, BS equal bilaterally without rales, rhonchi, wheezing or stridor.  Cardio: RRR with no MRGs. Brisk peripheral pulses without edema.  Abdomen: Soft, + BS.  Non tender, no guarding, rebound, hernias, masses. Lymphatics: Non tender without lymphadenopathy.  Musculoskeletal: Full ROM, 5/5 strength, normal gait.  Skin: Warm, dry without rashes, lesions, ecchymosis.  Neuro: Cranial nerves intact. Normal muscle tone, no cerebellar symptoms. Sensation intact.  Psych: Awake and oriented X 3, normal affect, Insight and Judgment appropriate.    Assessment:   1. Need for zoster vaccination   2. Need for immunization against influenza   3. Chronic right shoulder pain   4. Psychosis, unspecified psychosis type ([place])   5. Hospital discharge follow-up   Logan Bailey was seen today for hospitalization follow-up.  Diagnoses and all orders for this visit:  Need for zoster vaccination -     Varicella-zoster vaccine IM (Shingrix)  Need for immunization against influenza -     Flu Vaccine QUAD 65mo+IM (Fluarix, Fluzone & Alfiuria  Quad PF)  Chronic right shoulder pain -     AMB referral to orthopedics  Psychosis, unspecified psychosis type [facility])  Hospital discharge follow-up    No orders of the defined types were placed in this encounter.   This note has been created with Education officer, environmental. Any transcriptional errors are unintentional.   Grayce Sessions, NP 07/10/2021, 9:57 AM

## 2021-07-10 NOTE — Progress Notes (Signed)
[facility]   Subjective:   Logan Bailey is a 60 y.o. male presents for hospital follow up and establish care. Admit date to the hospital was 06/12/21, patient was discharged from the hospital on 06/13/21, patient was admitted for:   Past Medical History:  Diagnosis Date   Depression    GERD (gastroesophageal reflux disease)    Seasonal allergies      Allergies  Allergen Reactions   Bee Venom Anaphylaxis and Hives      Current Outpatient Medications on File Prior to Visit  Medication Sig Dispense Refill   EPINEPHrine 0.3 mg/0.3 mL IJ SOAJ injection Inject 0.3 mLs (0.3 mg total) into the muscle as needed for anaphylaxis. 1 each 0   gabapentin (NEURONTIN) 300 MG capsule take 1 capsule by mouth three times daily 90 capsule 2   omeprazole (PRILOSEC) 20 MG capsule TAKE 1 CAPSULE (20 MG TOTAL) BY MOUTH DAILY. 30 capsule 3   vitamin B-12 (CYANOCOBALAMIN) 100 MCG tablet Take 200 mcg by mouth daily.      DULoxetine (CYMBALTA) 20 MG capsule Take 1 capsule (20 mg total) by mouth daily for 14 days. 14 capsule 0   OLANZapine (ZYPREXA) 5 MG tablet Take 1 tablet (5 mg total) by mouth at bedtime for 14 days. 14 tablet 0   [DISCONTINUED] buPROPion (WELLBUTRIN XL) 150 MG 24 hr tablet Take 1 tablet (150 mg total) by mouth daily. For smoking cessation 90 tablet 1   [DISCONTINUED] famotidine (PEPCID) 20 MG tablet Take 1 tablet (20 mg total) by mouth 2 (two) times daily. (Patient not taking: No sig reported) 30 tablet 0   No current facility-administered medications on file prior to visit.     Review of System: ROS  Objective:  BP 132/85 (BP Location: Right Arm, Patient Position: Sitting, Cuff Size: Normal)   Pulse (!) 56   Temp 98.2 F (36.8 C) (Temporal)   Ht 6\' 2"  (1.88 m)   Wt 174 lb 3.2 oz (79 kg)   SpO2 98%   BMI 22.37 kg/m   Filed Weights   07/10/21 0929  Weight: 174 lb 3.2 oz (79 kg)    Physical Exam:   General Appearance: Well nourished, in no apparent  distress. Eyes: PERRLA, EOMs, conjunctiva no swelling or erythema Sinuses: No Frontal/maxillary tenderness ENT/Mouth: Ext aud canals clear, TMs without erythema, bulging. No erythema, swelling, or exudate on post pharynx.  Tonsils not swollen or erythematous. Hearing normal.  Neck: Supple, thyroid normal.  Respiratory: Respiratory effort normal, BS equal bilaterally without rales, rhonchi, wheezing or stridor.  Cardio: RRR with no MRGs. Brisk peripheral pulses without edema.  Abdomen: Soft, + BS.  Non tender, no guarding, rebound, hernias, masses. Lymphatics: Non tender without lymphadenopathy.  Musculoskeletal: Full ROM, 5/5 strength, normal gait.  Skin: Warm, dry without rashes, lesions, ecchymosis.  Neuro: Cranial nerves intact. Normal muscle tone, no cerebellar symptoms. Sensation intact.  Psych: Awake and oriented X 3, normal affect, Insight and Judgment appropriate.    Assessment:   1. Need for zoster vaccination   Logan Bailey was seen today for hospitalization follow-up.  Diagnoses and all orders for this visit:  Need for zoster vaccination -     Varicella-zoster vaccine IM (Shingrix)  Need for immunization against influenza -     Flu Vaccine QUAD 10mo+IM (Fluarix, Fluzone & Alfiuria Quad PF)  Chronic right shoulder pain -     AMB referral to orthopedics  Psychosis, unspecified psychosis type (H[facility]) [facility] manages mental health Risperdal  injection monthly   Hospital discharge follow-up No orders of the defined types were placed in this encounter.   This note has been created with Surveyor, quantity. Any transcriptional errors are unintentional.   Kerin Perna, NP 07/10/2021, 9:43 AM

## 2021-07-10 NOTE — Patient Instructions (Signed)
Zoster Vaccine, Recombinant injection What is this medication? ZOSTER VACCINE (ZOS ter vak SEEN) is a vaccine used to reduce the risk of getting shingles. This vaccine is not used to treat shingles or nerve pain from shingles. This medicine may be used for other purposes; ask your health care provider or pharmacist if you have questions. COMMON BRAND NAME(S): [facility] What should I tell my care team before I take this medication? They need to know if you have any of these conditions: cancer immune system problems an unusual or allergic reaction to Zoster vaccine, other medications, foods, dyes, or preservatives pregnant or trying to get pregnant breast-feeding How should I use this medication? This vaccine is injected into a muscle. It is given by a health care provider. A copy of Vaccine Information Statements will be given before each vaccination. Be sure to read this information carefully each time. This sheet may change often. Talk to your health care provider about the use of this vaccine in children. This vaccine is not approved for use in children. Overdosage: If you think you have taken too much of this medicine contact a poison control center or emergency room at once. NOTE: This medicine is only for you. Do not share this medicine with others. What if I miss a dose? Keep appointments for follow-up (booster) doses. It is important not to miss your dose. Call your health care provider if you are unable to keep an appointment. What may interact with this medication? medicines that suppress your immune system medicines to treat cancer steroid medicines like prednisone or cortisone This list may not describe all possible interactions. Give your health care provider a list of all the medicines, herbs, non-prescription drugs, or dietary supplements you use. Also tell them if you smoke, drink alcohol, or use illegal drugs. Some items may interact with your medicine. What should I watch for  while using this medication? Visit your health care provider regularly. This vaccine, like all vaccines, may not fully protect everyone. What side effects may I notice from receiving this medication? Side effects that you should report to your doctor or health care professional as soon as possible: allergic reactions (skin rash, itching or hives; swelling of the face, lips, or tongue) trouble breathing Side effects that usually do not require medical attention (report these to your doctor or health care professional if they continue or are bothersome): chills headache fever nausea pain, redness, or irritation at site where injected tiredness vomiting This list may not describe all possible side effects. Call your doctor for medical advice about side effects. You may report side effects to FDA at 1-800-FDA-1088. Where should I keep my medication? This vaccine is only given by a health care provider. It will not be stored at home. NOTE: This sheet is a summary. It may not cover all possible information. If you have questions about this medicine, talk to your doctor, pharmacist, or health care provider.  2022 Elsevier/Gold Standard (2021-04-14 00:00:00) Influenza, Adult Influenza is also called "the flu." It is an infection in the lungs, nose, and throat (respiratory tract). It spreads easily from person to person (is contagious). The flu causes symptoms that are like a cold, along with high fever and body aches. What are the causes? This condition is caused by the influenza virus. You can get the virus by: Breathing in droplets that are in the air after a person infected with the flu coughed or sneezed. Touching something that has the virus on it and then touching  your mouth, nose, or eyes. What increases the risk? Certain things may make you more likely to get the flu. These include: Not washing your hands often. Having close contact with many people during cold and flu season. Touching  your mouth, eyes, or nose without first washing your hands. Not getting a flu shot every year. You may have a higher risk for the flu, and serious problems, such as a lung infection (pneumonia), if you: Are older than 65. Are pregnant. Have a weakened disease-fighting system (immune system) because of a disease or because you are taking certain medicines. Have a long-term (chronic) condition, such as: Heart, kidney, or lung disease. Diabetes. Asthma. Have a liver disorder. Are very overweight (morbidly obese). Have anemia. What are the signs or symptoms? Symptoms usually begin suddenly and last 4-14 days. They may include: Fever and chills. Headaches, body aches, or muscle aches. Sore throat. Cough. Runny or stuffy (congested) nose. Feeling discomfort in your chest. Not wanting to eat as much as normal. Feeling weak or tired. Feeling dizzy. Feeling sick to your stomach or throwing up. How is this treated? If the flu is found early, you can be treated with antiviral medicine. This can help to reduce how bad the illness is and how long it lasts. This may be given by mouth or through an IV tube. Taking care of yourself at home can help your symptoms get better. Your doctor may want you to: Take over-the-counter medicines. Drink plenty of fluids. The flu often goes away on its own. If you have very bad symptoms or other problems, you may be treated in a hospital. Follow these instructions at home:   Activity Rest as needed. Get plenty of sleep. Stay home from work or school as told by your doctor. Do not leave home until you do not have a fever for 24 hours without taking medicine. Leave home only to go to your doctor. Eating and drinking Take an ORS (oral rehydration solution). This is a drink that is sold at pharmacies and stores. Drink enough fluid to keep your pee pale yellow. Drink clear fluids in small amounts as you are able. Clear fluids include: Water. Ice  chips. Fruit juice mixed with water. Low-calorie sports drinks. Eat bland foods that are easy to digest. Eat small amounts as you are able. These foods include: Bananas. Applesauce. Rice. Lean meats. Toast. Crackers. Do not eat or drink: Fluids that have a lot of sugar or caffeine. Alcohol. Spicy or fatty foods. General instructions Take over-the-counter and prescription medicines only as told by your doctor. Use a cool mist humidifier to add moisture to the air in your home. This can make it easier for you to breathe. When using a cool mist humidifier, clean it daily. Empty water and replace with clean water. Cover your mouth and nose when you cough or sneeze. Wash your hands with soap and water often and for at least 20 seconds. This is also important after you cough or sneeze. If you cannot use soap and water, use alcohol-based hand sanitizer. Keep all follow-up visits. How is this prevented?  Get a flu shot every year. You may get the flu shot in late summer, fall, or winter. Ask your doctor when you should get your flu shot. Avoid contact with people who are sick during fall and winter. This is cold and flu season. Contact a doctor if: You get new symptoms. You have: Chest pain. Watery poop (diarrhea). A fever. Your cough gets worse. You  start to have more mucus. You feel sick to your stomach. You throw up. Get help right away if you: Have shortness of breath. Have trouble breathing. Have skin or nails that turn a bluish color. Have very bad pain or stiffness in your neck. Get a sudden headache. Get sudden pain in your face or ear. Cannot eat or drink without throwing up. These symptoms may represent a serious problem that is an emergency. Get medical help right away. Call your local emergency services (911 in the [place] Do not wait to see if the symptoms will go away. Do not drive yourself to the hospital. Summary Influenza is also called "the flu." It is an  infection in the lungs, nose, and throat. It spreads easily from person to person. Take over-the-counter and prescription medicines only as told by your doctor. Getting a flu shot every year is the best way to not get the flu. This information is not intended to replace advice given to you by your health care provider. Make sure you discuss any questions you have with your health care provider. Document Revised: 03/14/2020 Document Reviewed: 03/14/2020 Elsevier Patient Education  2022 [employer].

## 2021-07-10 NOTE — Telephone Encounter (Signed)
   Telephone encounter was:  Unsuccessful.  07/10/2021 Name: Logan Bailey MRN: [id number] DOB: 1960/08/31  Unsuccessful outbound call made today to assist with:  Food Insecurity  Outreach Attempt:  2nd Attempt  A HIPAA compliant voice message was left requesting a return call.  Instructed patient to call back at   Instructed patient to call back at [phone]  at their earliest convenience. Yehuda Mao Greenauer -[facility] Guide , Embedded Care Coordination [facility], Care Management  [phone] [address] [place] , [place] [address] Email : [email or web address]

## 2021-07-10 NOTE — Telephone Encounter (Signed)
   Telephone encounter was:  Unsuccessful.  07/10/2021 Name: Logan Bailey MRN: [id number] DOB: [birth year]  Unsuccessful outbound call made today to assist with:  Food Insecurity  Outreach Attempt:  3rd Attempt.  Referral closed unable to contact patient.  A HIPAA compliant voice message was left requesting a return call.  Instructed patient to call back at   Instructed patient to call back at [phone]  at their earliest convenience. Yehuda Mao Greenauer -[facility] Guide , Embedded Care Coordination [facility], Care Management  [phone] [address] [place] , [place] [address] Email : [email or web address]

## 2021-07-10 NOTE — Progress Notes (Signed)
[facility]   Subjective:   Logan Bailey is a 60 y.o. male presents for ED follow up from  06/12/21, patient left on 06/13/21. Presented for chest pain. Today he denies any chest pain, shortness of breath, headaches,  or lower extremity edema . He is also complaining of right shoulder pain.  Past Medical History:  Diagnosis Date   Depression    GERD (gastroesophageal reflux disease)    Seasonal allergies      Allergies  Allergen Reactions   Bee Venom Anaphylaxis and Hives      Current Outpatient Medications on File Prior to Visit  Medication Sig Dispense Refill   EPINEPHrine 0.3 mg/0.3 mL IJ SOAJ injection Inject 0.3 mLs (0.3 mg total) into the muscle as needed for anaphylaxis. 1 each 0   gabapentin (NEURONTIN) 300 MG capsule take 1 capsule by mouth three times daily 90 capsule 2   omeprazole (PRILOSEC) 20 MG capsule TAKE 1 CAPSULE (20 MG TOTAL) BY MOUTH DAILY. 30 capsule 3   vitamin B-12 (CYANOCOBALAMIN) 100 MCG tablet Take 200 mcg by mouth daily.      DULoxetine (CYMBALTA) 20 MG capsule Take 1 capsule (20 mg total) by mouth daily for 14 days. 14 capsule 0   OLANZapine (ZYPREXA) 5 MG tablet Take 1 tablet (5 mg total) by mouth at bedtime for 14 days. 14 tablet 0   [DISCONTINUED] buPROPion (WELLBUTRIN XL) 150 MG 24 hr tablet Take 1 tablet (150 mg total) by mouth daily. For smoking cessation 90 tablet 1   [DISCONTINUED] famotidine (PEPCID) 20 MG tablet Take 1 tablet (20 mg total) by mouth 2 (two) times daily. (Patient not taking: No sig reported) 30 tablet 0   No current facility-administered medications on file prior to visit.     Review of System: Comprehensive ROS pertinent and positive and negative noted in HPI  Objective:  BP 132/85 (BP Location: Right Arm, Patient Position: Sitting, Cuff Size: Normal)   Pulse (!) 56   Temp 98.2 F (36.8 C) (Temporal)   Ht 6\' 2"  (1.88 m)   Wt 174 lb 3.2 oz (79 kg)   SpO2 98%   BMI 22.37 kg/m   Filed Weights    07/10/21 0929  Weight: 174 lb 3.2 oz (79 kg)    Physical Exam: General Appearance: Well nourished, in no apparent distress. Eyes: PERRLA, EOMs, conjunctiva no swelling or erythema Sinuses: No Frontal/maxillary tenderness ENT/Mouth: Ext aud canals clear, TMs without erythema, bulging.Hearing normal.  Neck: Supple, thyroid normal.  Respiratory: Respiratory effort normal, BS equal bilaterally without rales, rhonchi, wheezing or stridor.  Cardio: RRR with no MRGs. Brisk peripheral pulses without edema.  Abdomen: Soft, + BS.  Non tender, no guarding, rebound, hernias, masses. Lymphatics: Non tender without lymphadenopathy.  Musculoskeletal: Full ROM, 5/5 strength, normal gait.  Skin: Warm, dry without rashes, lesions, ecchymosis.  Neuro: Cranial nerves intact. Normal muscle tone, no cerebellar symptoms. Sensation intact.  Psych: Awake and oriented X 3, normal affect, Insight and Judgment appropriate.    Assessment:   Monserrat was seen today for hospitalization follow-up.  Diagnoses and all orders for this visit:  Need for zoster vaccination -     Varicella-zoster vaccine IM (Shingrix)  Need for immunization against influenza -     Flu Vaccine QUAD 13mo+IM (Fluarix, Fluzone & Alfiuria Quad PF)  Chronic right shoulder pain -     AMB referral to orthopedics  Psychosis, unspecified psychosis type ([place]) [facility] manages mental health Risperdal injection monthly  Hospital discharge follow-up Left before being seen    This note has been created with Dragon speech recognition software and Paediatric nurse. Any transcriptional errors are unintentional.   Grayce Sessions, NP 07/10/2021, 9:59 AM

## 2021-08-05 ENCOUNTER — Other Ambulatory Visit: Payer: Self-pay

## 2021-08-05 MED ORDER — CAPLYTA 42 MG PO CAPS
42.0000 mg | ORAL_CAPSULE | Freq: Every day | ORAL | 2 refills | Status: DC
  Filled 2021-08-05 – 2021-08-27 (×2): qty 30, 30d supply, fill #0

## 2021-08-05 MED ORDER — GABAPENTIN 300 MG PO CAPS
300.0000 mg | ORAL_CAPSULE | Freq: Three times a day (TID) | ORAL | 2 refills | Status: AC
  Filled 2021-10-07: qty 90, 30d supply, fill #0
  Filled 2021-11-11: qty 90, 30d supply, fill #1
  Filled 2022-02-04: qty 90, 30d supply, fill #2

## 2021-08-11 ENCOUNTER — Ambulatory Visit (INDEPENDENT_AMBULATORY_CARE_PROVIDER_SITE_OTHER): Payer: Medicaid Other

## 2021-08-11 ENCOUNTER — Ambulatory Visit (INDEPENDENT_AMBULATORY_CARE_PROVIDER_SITE_OTHER): Payer: Medicaid Other | Admitting: Physician Assistant

## 2021-08-11 ENCOUNTER — Encounter: Payer: Self-pay | Admitting: Physician Assistant

## 2021-08-11 DIAGNOSIS — M25511 Pain in right shoulder: Secondary | ICD-10-CM

## 2021-08-11 DIAGNOSIS — G8929 Other chronic pain: Secondary | ICD-10-CM

## 2021-08-11 MED ORDER — LIDOCAINE HCL 1 % IJ SOLN
5.0000 mL | INTRAMUSCULAR | Status: AC | PRN
  Administered 2021-08-11: 5 mL

## 2021-08-11 MED ORDER — METHYLPREDNISOLONE ACETATE 40 MG/ML IJ SUSP
40.0000 mg | INTRAMUSCULAR | Status: AC | PRN
Start: 2021-08-11 — End: 2021-08-11
  Administered 2021-08-11: 40 mg via INTRA_ARTICULAR

## 2021-08-11 NOTE — Progress Notes (Signed)
Office Visit Note   Patient: Logan Bailey           Date of Birth: [birth year]           MRN: [id number] Visit Date: 08/11/2021              Requested by: Grayce Sessions, NP [address] [place],  [place] [address] PCP: Grayce Sessions, NP  Chief Complaint  Patient presents with   Right Shoulder - Pain      HPI: Patient is a pleasant 61 year old gentleman with a long history of chronic right shoulder pain.  He denies any specific injury.  He has tried some exercises on his own but still continues to bother him.  He is right-hand dominant.  He points to the top of his shoulder denies any neck pain or radicular findings  Assessment & Plan: Visit Diagnoses:  1. Chronic right shoulder pain     Plan: Findings most likely with some impingement based on exam.  I talked him about the natural history of this.  Talked him about doing an injection he would like to go forward with that today.  Also have given him information about Voltaren gel.  He would also be interested in doing some physical therapy this is been written for.  May follow-up with me in 5 weeks.  If he did not have significant improvement we could consider an MRI  Follow-Up Instructions: No follow-ups on file.   Ortho Exam  Patient is alert, oriented, no adenopathy, well-dressed, normal affect, normal respiratory effort. Right shoulder slightly tender to palpation over the [facility] joint.  Has equal strength and range of motion in forward elevation internal rotation behind the back resisted abduction.  He does have positive empty can sign and impingement findings.  Grip strength is good.  Positive speeds testing produces some pain over the anterior shoulder  Imaging: XR Shoulder Right  Result Date: 08/11/2021 Radiographs of his right shoulder demonstrate some superior osteophyte of the acromion with some degenerative changes of the [facility] joint.  Some mild sclerotic changes of the humeral head however he has  maintained good joint spacing.  No acute osseous injuries no acute findings on soft tissue in his lungs  No images are attached to the encounter.  Labs: Lab Results  Component Value Date   LABURIC 5.0 12/14/2016     Lab Results  Component Value Date   ALBUMIN 4.3 04/12/2019   ALBUMIN 4.2 08/16/2017   ALBUMIN 3.4 (L) 07/30/2017    Lab Results  Component Value Date   MG 2.1 10/31/2016   No results found for: VD25OH  No results found for: PREALBUMIN CBC EXTENDED Latest Ref Rng & Units 02/26/2021 04/12/2019 08/16/2017  WBC 4.0 - 10.5 K/uL 6.1 4.2 5.5  RBC 4.22 - 5.81 MIL/uL 4.69 4.37 4.82  HGB 13.0 - 17.0 g/dL 56.3 87.5 64.3  HCT 32.9 - 52.0 % 43.0 40.1 42.8  PLT 150 - 400 K/uL 297 264 312  NEUTROABS 1.7 - 7.7 K/uL 3.3 2.5 2.7  LYMPHSABS 0.7 - 4.0 K/uL 2.2 1.4 2.4     There is no height or weight on file to calculate BMI.  Orders:  Orders Placed This Encounter  Procedures   XR Shoulder Right   Ambulatory referral to Physical Therapy   No orders of the defined types were placed in this encounter.    Procedures: Large Joint Inj: R glenohumeral on 08/11/2021 9:39 AM Indications: diagnostic evaluation and pain Details: 25 G  1.5 in needle, posterior approach  Arthrogram: No  Medications: 5 mL lidocaine 1 %; 40 mg methylPREDNISolone acetate 40 MG/ML Outcome: tolerated well, no immediate complications Procedure, treatment alternatives, risks and benefits explained, specific risks discussed. Consent was given by the patient.     Clinical Data: No additional findings.  ROS:  All other systems negative, except as noted in the HPI. Review of Systems  Objective: Vital Signs: There were no vitals taken for this visit.  Specialty Comments:  No specialty comments available.  PMFS History: Patient Active Problem List   Diagnosis Date Noted   Poor dentition 06/23/2018   Tobacco use disorder 06/23/2018   Depression with anxiety 06/23/2018   Chronic bilateral  low back pain with bilateral sciatica 06/23/2018   Porokeratosis 01/13/2018   Past Medical History:  Diagnosis Date   Depression    GERD (gastroesophageal reflux disease)    Seasonal allergies     No family history on file.  Past Surgical History:  Procedure Laterality Date   HERNIA REPAIR  10/16/2012   incarcerated   INGUINAL HERNIA REPAIR Left 10/16/2012   Procedure: HERNIA REPAIR INGUINAL ADULT;  Surgeon: Axel Filler, MD;  Location: [facility] OR;  Service: General;  Laterality: Left;   Social History   Occupational History   Not on file  Tobacco Use   Smoking status: Former    Packs/day: 1.00    Years: 30.00    Pack years: 30.00    Types: Cigarettes    Quit date: 02/20/2018    Years since quitting: 3.4   Smokeless tobacco: Never   Tobacco comments:    no cigarette in 14 days   Vaping Use   Vaping Use: Never used  Substance and Sexual Activity   Alcohol use: No   Drug use: Yes    Types: Marijuana   Sexual activity: Not on file

## 2021-08-27 ENCOUNTER — Other Ambulatory Visit: Payer: Self-pay

## 2021-08-28 ENCOUNTER — Other Ambulatory Visit: Payer: Self-pay

## 2021-09-04 ENCOUNTER — Other Ambulatory Visit: Payer: Self-pay

## 2021-09-07 ENCOUNTER — Other Ambulatory Visit: Payer: Self-pay

## 2021-09-07 ENCOUNTER — Ambulatory Visit (INDEPENDENT_AMBULATORY_CARE_PROVIDER_SITE_OTHER): Payer: Medicaid Other | Admitting: Podiatry

## 2021-09-07 DIAGNOSIS — B351 Tinea unguium: Secondary | ICD-10-CM

## 2021-09-07 DIAGNOSIS — M79674 Pain in right toe(s): Secondary | ICD-10-CM

## 2021-09-07 DIAGNOSIS — M79671 Pain in right foot: Secondary | ICD-10-CM

## 2021-09-07 DIAGNOSIS — Q828 Other specified congenital malformations of skin: Secondary | ICD-10-CM | POA: Diagnosis not present

## 2021-09-07 DIAGNOSIS — M79672 Pain in left foot: Secondary | ICD-10-CM | POA: Diagnosis not present

## 2021-09-07 DIAGNOSIS — M79675 Pain in left toe(s): Secondary | ICD-10-CM | POA: Diagnosis not present

## 2021-09-10 NOTE — Therapy (Signed)
OUTPATIENT PHYSICAL THERAPY SHOULDER EVALUATION   Patient Name: Logan Bailey MRN: [id number] DOB:07-05-1961, 61 y.o., male Today's Date: 09/11/2021   PT End of Session - 09/11/21 0854     Visit Number 1    Number of Visits 8    Date for PT Re-Evaluation 11/06/21    PT Start Time 0915    PT Stop Time 1000    PT Time Calculation (min) 45 min    Activity Tolerance Patient tolerated treatment well    Behavior During Therapy [facility] for tasks assessed/performed             Past Medical History:  Diagnosis Date   Depression    GERD (gastroesophageal reflux disease)    Seasonal allergies    Past Surgical History:  Procedure Laterality Date   HERNIA REPAIR  10/16/2012   incarcerated   INGUINAL HERNIA REPAIR Left 10/16/2012   Procedure: HERNIA REPAIR INGUINAL ADULT;  Surgeon: Axel Filler, MD;  Location: [facility] OR;  Service: General;  Laterality: Left;   Patient Active Problem List   Diagnosis Date Noted   Poor dentition 06/23/2018   Tobacco use disorder 06/23/2018   Depression with anxiety 06/23/2018   Chronic bilateral low back pain with bilateral sciatica 06/23/2018   Porokeratosis 01/13/2018    PCP: Grayce Sessions, NP  REFERRING PROVIDER: Persons, [place], [place]  REFERRING DIAG: Chronic right shoulder pain  THERAPY DIAG:  Chronic right shoulder pain  Muscle weakness (generalized)   ONSET DATE: patient reports onset in 2019   SUBJECTIVE:           SUBJECTIVE STATEMENT: Patient reports right shoulder pain for a long time, since 2019 when he had an accident. Patient is right handed. His shoulder gets stiff and painful, he did have an injection that helped but still having some stiffness. Patient states he gets shoulder pain pretty much anytime he uses the shoulder. Pain is located to the top of the shoulder. Sometimes feels like shoulder catches and he has to move it around to feel better. Denies any radiating/referred pain into arm, numbness, or  tingling.  PERTINENT HISTORY: None  PAIN:  Are you having pain? No NPRS scale: 0/10 (5-6/10 when using the right shoulder Pain location: Shoulder Pain orientation: Right PAIN TYPE: Chronic Pain description: intermittent, sharp Aggravating factors: Using or raising the right shoulder, lifting Relieving factors: Medication, injections, rest  PRECAUTIONS: None  WEIGHT BEARING RESTRICTIONS No  FALLS:  Has patient fallen in last 6 months? No   LIVING ENVIRONMENT: Lives with: lives with their family  OCCUPATION: Disability  PLOF: Independent  PATIENT GOALS: Pain relief and improve ability to use right arm   OBJECTIVE:  DIAGNOSTIC FINDINGS:  X-ray Right Shoulder 08/11/2021: Radiographs of his right shoulder demonstrate some superior osteophyte of the acromion with some degenerative changes of the [facility] joint. Some mild sclerotic changes of the humeral head however he has maintained good joint spacing.  No acute osseous injuries no acute findings on soft tissue in his lungs.  PATIENT SURVEYS:  Quick Dash 50%  COGNITION: Overall cognitive status: Within functional limits for tasks assessed     SENSATION:  Light touch: Appears intact  POSTURE: Rounded shoulder and forward head posture  PALPATION: Tender to palpation at [facility] joint and periacromial region, tender with palpable trigger points to infraspinatus region  UPPER EXTREMITY AROM/PROM:  A/PROM Right 09/11/2021 Left 09/11/2021  Shoulder flexion 160 160  Shoulder abduction 165 165  Shoulder extension 45 45  Shoulder internal rotation  T8 T6  Shoulder external rotation T2 T2   UPPER EXTREMITY MMT:  MMT Right 09/11/2021 Left 09/11/2021  Shoulder flexion 4+ 5  Shoulder abduction 4+ 5  Shoulder internal rotation 5 5  Shoulder external rotation 4 5  Periscapular musculature 4- 4-   SHOULDER SPECIAL TESTS: Impingement tests: Hawkins/Kennedy impingement test: positive    TODAY'S TREATMENT:  Sidelying cross body  posterior cuff stretch 3 x 30 sec Doorway pec stretch at 90 deg abduction 3 x 30 sec Banded shoulder ER with green x 10 Row with green x 10   PATIENT EDUCATION: Education details: Exam findings, POC, HEP Person educated: Patient Education method: Explanation, Demonstration, Tactile cues, Verbal cues, and Handouts Education comprehension: verbalized understanding, returned demonstration, verbal cues required, tactile cues required, and needs further education  HOME EXERCISE PROGRAM: Access Code: [email or web address]   ASSESSMENT: CLINICAL IMPRESSION: Patient is a 61 y.o. male who was seen today for physical therapy evaluation and treatment for right shoulder pain that seems consistent with rotator cuff tendinopathy / subacromial pain syndrome. Objective impairments include decreased ROM, decreased strength, postural dysfunction, and pain. These impairments are limiting patient from cleaning, meal prep, yard work, and shopping. Personal factors including Past/current experiences and Time since onset of injury/illness/exacerbation are also affecting patient's functional outcome. Patient will benefit from skilled PT to address above impairments and improve overall function.  REHAB POTENTIAL: Good  CLINICAL DECISION MAKING: Stable/uncomplicated  EVALUATION COMPLEXITY: Low   GOALS: Goals reviewed with patient? Yes  SHORT TERM GOALS:  STG Name Target Date Goal status  1 Patient will be I with initial HEP in order to progress with therapy. Baseline: HEP provided at eval 10/09/2021 INITIAL  2 Patient will report pain level with right shoulder activity </= 3/10 in order to improve ability to perform household tasks Baseline: 5-6/10 pain with right shoulder activity 10/09/2021 INITIAL   LONG TERM GOALS:   LTG Name Target Date Goal status  1 Patient will be I with final HEP to maintain progress from PT. Baseline: HEP provided at eval 11/06/2021 INITIAL  2 Patient will report </= 25% on QuickDASH to  indicate improvement in functional ability Baseline: 50% 11/06/2021 INITIAL  3 Patient will demonstrate right shoulder strength grossly 5/5 MMT in order to improve tolerance for right shoulder activity and lifting Baseline: patient demonstrates strength deficits of the right shoulder rotator cuff 11/06/2021 INITIAL  4 Patient will report pain level with right shoulder acitvity </= 1/10 in order to normalize right shoulder use Baseline: 5-6/10 pain at eval 11/06/2021 INITIAL    PLAN: PT FREQUENCY: 1x/week  PT DURATION: 8 weeks  PLANNED INTERVENTIONS: Therapeutic exercises, Therapeutic activity, Neuro Muscular re-education, Balance training, Gait training, Patient/Family education, Joint mobilization, Aquatic Therapy, Dry Needling, Electrical stimulation, Spinal mobilization, Cryotherapy, Moist heat, Taping, and Manual therapy  PLAN FOR NEXT SESSION: manual/dry needling for posterior cuff, posterior cuff and pec stretch, trial MWM for GHJ and/or AC joint with shoulder elevation, rotator cuff and periscapular strengthening   Rosana Hoes, PT, DPT, LAT, ATC 09/11/21  12:32 PM Phone: [phone] Fax: [phone]

## 2021-09-11 ENCOUNTER — Telehealth (INDEPENDENT_AMBULATORY_CARE_PROVIDER_SITE_OTHER): Payer: Self-pay

## 2021-09-11 ENCOUNTER — Ambulatory Visit: Payer: Medicaid Other | Attending: Physician Assistant | Admitting: Physical Therapy

## 2021-09-11 ENCOUNTER — Encounter: Payer: Self-pay | Admitting: Physical Therapy

## 2021-09-11 ENCOUNTER — Other Ambulatory Visit: Payer: Self-pay

## 2021-09-11 DIAGNOSIS — M6281 Muscle weakness (generalized): Secondary | ICD-10-CM | POA: Insufficient documentation

## 2021-09-11 DIAGNOSIS — M25511 Pain in right shoulder: Secondary | ICD-10-CM | POA: Insufficient documentation

## 2021-09-11 DIAGNOSIS — G8929 Other chronic pain: Secondary | ICD-10-CM | POA: Diagnosis present

## 2021-09-11 NOTE — Patient Instructions (Signed)
Access Code: [email or web address] URL: [email or web address] Date: 09/11/2021 Prepared by: Rosana Hoes  Exercises Sidelying Posterior Cuff Cross Body Stretch - 1/4 Turn Back - 2 x daily - 3 reps - 30 seconds hold Doorway Pec Stretch at 90 Degrees Abduction - 2 x daily - 3 reps - 30 seconds hold Shoulder External Rotation with Anchored Resistance - 1 x daily - 3 sets - 10 reps Banded Row - 1 x daily - 3 sets - 10 reps

## 2021-09-11 NOTE — Telephone Encounter (Signed)
Copied from CRM [id number]. Topic: General - Other >> Sep 10, 2021  2:38 PM Wyonia Hough E wrote: Reason for CRM: Pt had procedure on his foot Monday and has PT tomorrow / pt wants to know if he needs a follow up appt with Marcelino Duster now/ please advise   Please advise. Maryjean Morn, CMA

## 2021-09-13 ENCOUNTER — Encounter: Payer: Self-pay | Admitting: Podiatry

## 2021-09-13 NOTE — Progress Notes (Signed)
Subjective: Logan Bailey is a 61 y.o. male patient seen today for follow up of  painful porokeratotic lesion(s) bilaterally and painful mycotic toenails that limit ambulation. Painful toenails interfere with ambulation. Aggravating factors include wearing enclosed shoe gear. Pain is relieved with periodic professional debridement. Painful porokeratotic lesions are aggravated when weightbearing with and without shoegear. Pain is relieved with periodic professional debridement..   New problem(s)/concern(s) today: None    PCP is Grayce Sessions, NP. Last visit was: 12/002/2022.  Allergies  Allergen Reactions   Bee Venom Anaphylaxis and Hives    Objective: Physical Exam  General: Patient is a pleasant 61 y.o. African American male WD, WN in NAD. AAO x 3.   Neurovascular Examination: CFT immediate b/l LE. Palpable DP/PT pulses b/l LE. Digital hair absent b/l. Skin temperature gradient WNL b/l. No pain with calf compression b/l. No edema noted b/l. No cyanosis or clubbing noted b/l LE.  Protective sensation intact 5/5 intact bilaterally with 10g monofilament b/l. Vibratory sensation intact b/l.  Dermatological:  Pedal integument with normal turgor, texture and tone b/l LE. No open wounds b/l. No interdigital macerations b/l. Toenails 1-5 b/l elongated, thickened, discolored with subungual debris. +Tenderness with dorsal palpation of nailplates. Porokeratotic lesion(s) noted submet head 1 b/l and submet head 5 b/l.  Musculoskeletal:  Normal muscle strength 5/5 to all lower extremity muscle groups bilaterally. Hallux valgus with bunion deformity noted b/l lower extremities.. No pain, crepitus or joint limitation noted with ROM b/l LE.  Patient ambulates independently without assistive aids.  Assessment: 1. Pain due to onychomycosis of toenails of both feet   2. Porokeratosis   3. Pain in both feet     Plan: Patient was evaluated and treated and all questions answered. Consent given  for treatment as described below: -Medicaid ABN signed for this year. Patient consents for services of paring of lesions on  today. Copy has been placed in patient chart. -Mycotic toenails 1-5 bilaterally were debrided in length and girth with sterile nail nippers and dremel without incident. -Painful porokeratotic lesion(s) submet head 1 b/l and submet head 5 b/l pared and enucleated with sterile scalpel blade without incident. Total number of lesions debrided=4. -Patient/POA to call should there be question/concern in the interim.  Return in about 3 months (around 12/06/2021).  Freddie Breech, DPM

## 2021-09-14 NOTE — Telephone Encounter (Signed)
Called patient mail box is full. Does not need to f/u with PCP until d/c from specialty

## 2021-09-15 ENCOUNTER — Other Ambulatory Visit: Payer: Self-pay

## 2021-09-15 ENCOUNTER — Encounter: Payer: Self-pay | Admitting: Physician Assistant

## 2021-09-15 ENCOUNTER — Ambulatory Visit (INDEPENDENT_AMBULATORY_CARE_PROVIDER_SITE_OTHER): Payer: Medicaid Other | Admitting: Physician Assistant

## 2021-09-15 DIAGNOSIS — M25511 Pain in right shoulder: Secondary | ICD-10-CM | POA: Diagnosis not present

## 2021-09-15 DIAGNOSIS — G8929 Other chronic pain: Secondary | ICD-10-CM | POA: Diagnosis not present

## 2021-09-15 NOTE — Progress Notes (Signed)
Office Visit Note   Patient: Logan Bailey           Date of Birth: Apr 28, 1961           MRN: [id number] Visit Date: 09/15/2021              Requested by: Grayce Sessions, NP [address] [place],  [place] [address] PCP: Grayce Sessions, NP  Chief Complaint  Patient presents with   Right Shoulder - Pain, Follow-up      HPI: Patient is a pleasant gentleman who is here for follow-up of his right shoulder.  I gave him an injection at his last visit a month ago and he has just begun physical therapy.  He is going to get some Voltaren gel.  He does feel like his motion is better and he is less stiff.  Still has some soreness but is just begun physical therapy  Assessment & Plan: Visit Diagnoses: No diagnosis found.  Plan: Patient will complete his physical therapy follow-up in 1 month.  I do not find any reason today to order an MRI as he is improving  Follow-Up Instructions: No follow-ups on file.   Ortho Exam  Patient is alert, oriented, no adenopathy, well-dressed, normal affect, normal respiratory effort. Right arm he has full forward extension internal rotation behind his back both with little pain.  Impingement findings have significantly decreased strength is improving  Imaging: No results found. No images are attached to the encounter.  Labs: Lab Results  Component Value Date   LABURIC 5.0 12/14/2016     Lab Results  Component Value Date   ALBUMIN 4.3 04/12/2019   ALBUMIN 4.2 08/16/2017   ALBUMIN 3.4 (L) 07/30/2017    Lab Results  Component Value Date   MG 2.1 10/31/2016   No results found for: VD25OH  No results found for: PREALBUMIN CBC EXTENDED Latest Ref Rng & Units 02/26/2021 04/12/2019 08/16/2017  WBC 4.0 - 10.5 K/uL 6.1 4.2 5.5  RBC 4.22 - 5.81 MIL/uL 4.69 4.37 4.82  HGB 13.0 - 17.0 g/dL 98.7 21.5 87.2  HCT 76.1 - 52.0 % 43.0 40.1 42.8  PLT 150 - 400 K/uL 297 264 312  NEUTROABS 1.7 - 7.7 K/uL 3.3 2.5 2.7  LYMPHSABS 0.7 - 4.0 K/uL  2.2 1.4 2.4     There is no height or weight on file to calculate BMI.  Orders:  No orders of the defined types were placed in this encounter.  No orders of the defined types were placed in this encounter.    Procedures: No procedures performed  Clinical Data: No additional findings.  ROS:  All other systems negative, except as noted in the HPI. Review of Systems  Objective: Vital Signs: There were no vitals taken for this visit.  Specialty Comments:  No specialty comments available.  PMFS History: Patient Active Problem List   Diagnosis Date Noted   Poor dentition 06/23/2018   Tobacco use disorder 06/23/2018   Depression with anxiety 06/23/2018   Chronic bilateral low back pain with bilateral sciatica 06/23/2018   Porokeratosis 01/13/2018   Past Medical History:  Diagnosis Date   Depression    GERD (gastroesophageal reflux disease)    Seasonal allergies     History reviewed. No pertinent family history.  Past Surgical History:  Procedure Laterality Date   HERNIA REPAIR  10/16/2012   incarcerated   INGUINAL HERNIA REPAIR Left 10/16/2012   Procedure: HERNIA REPAIR INGUINAL ADULT;  Surgeon: Axel Filler, MD;  Location: [facility] OR;  Service: General;  Laterality: Left;   Social History   Occupational History   Not on file  Tobacco Use   Smoking status: Former    Packs/day: 1.00    Years: 30.00    Pack years: 30.00    Types: Cigarettes    Quit date: 02/20/2018    Years since quitting: 3.5   Smokeless tobacco: Never   Tobacco comments:    no cigarette in 14 days   Vaping Use   Vaping Use: Never used  Substance and Sexual Activity   Alcohol use: No   Drug use: Yes    Types: Marijuana   Sexual activity: Not on file

## 2021-09-18 ENCOUNTER — Ambulatory Visit: Payer: Medicaid Other

## 2021-09-23 NOTE — Therapy (Signed)
OUTPATIENT PHYSICAL THERAPY TREATMENT NOTE   Patient Name: Logan Bailey MRN: [id number] DOB:1961-04-25, 61 y.o., male Today's Date: 09/25/2021  PCP: Grayce Sessions, NP REFERRING PROVIDER: Grayce Sessions, NP   PT End of Session - 09/25/21 0930     Visit Number 2    Number of Visits 8    Date for PT Re-Evaluation 11/06/21    Authorization Type MCD    Authorization Time Period 09/18/2021 - 10/01/2021    Authorization - Visit Number 1    Authorization - Number of Visits 3    PT Start Time 0915    PT Stop Time 1000    PT Time Calculation (min) 45 min    Activity Tolerance Patient tolerated treatment well    Behavior During Therapy [facility] for tasks assessed/performed             Past Medical History:  Diagnosis Date   Depression    GERD (gastroesophageal reflux disease)    Seasonal allergies    Past Surgical History:  Procedure Laterality Date   HERNIA REPAIR  10/16/2012   incarcerated   INGUINAL HERNIA REPAIR Left 10/16/2012   Procedure: HERNIA REPAIR INGUINAL ADULT;  Surgeon: Axel Filler, MD;  Location: [facility] OR;  Service: General;  Laterality: Left;   Patient Active Problem List   Diagnosis Date Noted   Poor dentition 06/23/2018   Tobacco use disorder 06/23/2018   Depression with anxiety 06/23/2018   Chronic bilateral low back pain with bilateral sciatica 06/23/2018   Porokeratosis 01/13/2018    REFERRING PROVIDER: Persons, [place], [place]   REFERRING DIAG: Chronic right shoulder pain  THERAPY DIAG:  Chronic right shoulder pain  Muscle weakness (generalized)  PERTINENT HISTORY: None  PRECAUTIONS: None  SUBJECTIVE: Patient reports some soreness in the right shoulder with his exercises.  PAIN:  Are you having pain? No NPRS scale: 2/10 (5-6/10 when using the right shoulder Pain location: Shoulder Pain orientation: Right PAIN TYPE: Chronic Pain description: intermittent, sore Aggravating factors: Using or raising the right shoulder,  lifting Relieving factors: Medication, injections, rest  PATIENT GOALS: Pain relief and improve ability to use right arm   OBJECTIVE:  PATIENT SURVEYS:  Quick Dash 50%   POSTURE: Rounded shoulder and forward head posture   PALPATION: Tender to palpation at [facility] joint and periacromial region, tender with palpable trigger points to infraspinatus region   UPPER EXTREMITY AROM/PROM:   A/PROM Right 09/11/2021 Left 09/11/2021 Right 09/25/2021  Shoulder flexion 160 160   Shoulder abduction 165 165   Shoulder extension 45 45   Shoulder internal rotation T8 T6 T7  Shoulder external rotation T2 T2     UPPER EXTREMITY MMT:   MMT Right 09/11/2021 Left 09/11/2021  Shoulder flexion 4+ 5  Shoulder abduction 4+ 5  Shoulder internal rotation 5 5  Shoulder external rotation 4 5  Periscapular musculature 4- 4-              TODAY'S TREATMENT:   09/11/2021:  Therapeutic Exercise: UBE L2 x 4 min (2 fwd/bwd) Doorway pec stretch at 90 deg abduction 3 x 30 sec Banded shoulder ER with green 2 x 10 Row with black 2 x 10 Extension with green 2 x 10 Scaption with 3# 2 x 10 Manual: Skilled palpation and monitoring of muscle tension while performing TPDN treatment GHJ mobs primarily inferior/posterior at various ranges Scap pinned posterior cuff stretching Trigger Point Dry Needling Treatment: Pre-treatment instruction: Patient instructed on dry needling rationale, procedures, and possible side effects  including pain during treatment (achy,cramping feeling), bruising, drop of blood, lightheadedness, nausea, sweating. Patient Consent Given: Yes Education handout provided: No Muscles treated: Right infraspinatus and teres minor Needle size and number: .30x39mm x 3 Electrical stimulation performed: No Parameters: N/A Treatment response/outcome: Twitch response elicited, Palpable decrease in muscle tension, and patient reported reduced soreness Post-treatment instructions: Patient instructed to expect  possible mild to moderate muscle soreness later today and/or tomorrow. Patient instructed in methods to reduce muscle soreness and to continue prescribed HEP. If patient was dry needled over the lung field, patient was instructed on signs and symptoms of pneumothorax and, however unlikely, to see immediate medical attention should they occur. Patient was also educated on signs and symptoms of infection and to seek medical attention should they occur. Patient verbalized understanding of these instructions and education.   09/11/2021: (evaluation)  Therapeutic Exercise: Sidelying cross body posterior cuff stretch 3 x 30 sec Doorway pec stretch at 90 deg abduction 3 x 30 sec Banded shoulder ER with green x 10 Row with green x 10   PATIENT EDUCATION: Education details: HEP, TPDN Person educated: Patient Education method: Explanation, Demonstration, Tactile cues, Verbal cues Education comprehension: verbalized understanding, returned demonstration, verbal cues required, tactile cues required, and needs further education   HOME EXERCISE PROGRAM: Access Code: [email or web address]     ASSESSMENT: CLINICAL IMPRESSION: Patient tolerated therapy well with no adverse effects. Performed TPDN for right posterior cuff this visit with good benefit as patient reported reduced soreness and demonstrated improved reach behind back. Therapy continues to focus on improving shoulder mobility and progressing rotator cuff strength. He does require cueing for proper posture and technique with exercises. Patient would benefit from continued skilled PT to progress his mobility and strength in order to reduce pain and maximize functional ability.  Objective impairments include decreased ROM, decreased strength, postural dysfunction, and pain.     GOALS: Goals reviewed with patient? Yes   SHORT TERM GOALS:   STG Name Target Date Goal status  1 Patient will be I with initial HEP in order to progress with therapy. Baseline: HEP  provided at eval 10/09/2021 INITIAL  2 Patient will report pain level with right shoulder activity </= 3/10 in order to improve ability to perform household tasks Baseline: 5-6/10 pain with right shoulder activity 10/09/2021 INITIAL    LONG TERM GOALS:    LTG Name Target Date Goal status  1 Patient will be I with final HEP to maintain progress from PT. Baseline: HEP provided at eval 11/06/2021 INITIAL  2 Patient will report </= 25% on QuickDASH to indicate improvement in functional ability Baseline: 50% 11/06/2021 INITIAL  3 Patient will demonstrate right shoulder strength grossly 5/5 MMT in order to improve tolerance for right shoulder activity and lifting Baseline: patient demonstrates strength deficits of the right shoulder rotator cuff 11/06/2021 INITIAL  4 Patient will report pain level with right shoulder acitvity </= 1/10 in order to normalize right shoulder use Baseline: 5-6/10 pain at eval 11/06/2021 INITIAL      PLAN: PT FREQUENCY: 1x/week   PT DURATION: 8 weeks   PLANNED INTERVENTIONS: Therapeutic exercises, Therapeutic activity, Neuro Muscular re-education, Balance training, Gait training, Patient/Family education, Joint mobilization, Aquatic Therapy, Dry Needling, Electrical stimulation, Spinal mobilization, Cryotherapy, Moist heat, Taping, and Manual therapy   PLAN FOR NEXT SESSION: review HEP and progress PRN, manual/dry needling for posterior cuff, posterior cuff and pec stretch, trial MWM for GHJ and/or AC joint with shoulder elevation, rotator cuff and periscapular strengthening  Rosana Hoes, PT, DPT, LAT, ATC 09/25/21  10:02 AM Phone: [phone] Fax: [phone]

## 2021-09-25 ENCOUNTER — Ambulatory Visit: Payer: Medicaid Other | Admitting: Physical Therapy

## 2021-09-25 ENCOUNTER — Encounter: Payer: Self-pay | Admitting: Physical Therapy

## 2021-09-25 ENCOUNTER — Other Ambulatory Visit: Payer: Self-pay

## 2021-09-25 DIAGNOSIS — M6281 Muscle weakness (generalized): Secondary | ICD-10-CM

## 2021-09-25 DIAGNOSIS — M25511 Pain in right shoulder: Secondary | ICD-10-CM | POA: Diagnosis not present

## 2021-09-25 DIAGNOSIS — G8929 Other chronic pain: Secondary | ICD-10-CM

## 2021-10-01 NOTE — Therapy (Signed)
OUTPATIENT PHYSICAL THERAPY TREATMENT NOTE Re-evaluation for Medicaid Authorization   Patient Name: Logan Bailey MRN: [id number] DOB:1961/03/08, 61 y.o., male Today's Date: 10/02/2021  PCP: Grayce Sessions, NP REFERRING PROVIDER: Persons, [place], [place]   PT End of Session - 10/02/21 [phone]     Visit Number 3    Number of Visits 8    Date for PT Re-Evaluation 11/06/21    Authorization Type MCD    Authorization Time Period 09/18/2021 - 10/01/2021    Authorization - Visit Number --    Authorization - Number of Visits --    PT Start Time 0915    PT Stop Time 1000    PT Time Calculation (min) 45 min    Activity Tolerance Patient tolerated treatment well    Behavior During Therapy [facility] for tasks assessed/performed              Past Medical History:  Diagnosis Date   Depression    GERD (gastroesophageal reflux disease)    Seasonal allergies    Past Surgical History:  Procedure Laterality Date   HERNIA REPAIR  10/16/2012   incarcerated   INGUINAL HERNIA REPAIR Left 10/16/2012   Procedure: HERNIA REPAIR INGUINAL ADULT;  Surgeon: Axel Filler, MD;  Location: [facility] OR;  Service: General;  Laterality: Left;   Patient Active Problem List   Diagnosis Date Noted   Poor dentition 06/23/2018   Tobacco use disorder 06/23/2018   Depression with anxiety 06/23/2018   Chronic bilateral low back pain with bilateral sciatica 06/23/2018   Porokeratosis 01/13/2018    REFERRING PROVIDER: Persons, [place]   REFERRING DIAG: Chronic right shoulder pain  THERAPY DIAG:  Chronic right shoulder pain  Muscle weakness (generalized)  PERTINENT HISTORY: None  PRECAUTIONS: None  SUBJECTIVE: Patient reports his shoulder is feeling much better now, it is less tense and he feels better using it.  PAIN:  Are you having pain? Yes NPRS scale: 1/10 (2-3/10 when using the right shoulder) Pain location: Shoulder Pain orientation: Right PAIN TYPE: Chronic Pain description:  intermittent, sore Aggravating factors: Using or raising the right shoulder, lifting Relieving factors: Medication, injections, rest  PATIENT GOALS: Pain relief and improve ability to use right arm   OBJECTIVE: (BOLDED MEASURES ASSESSED THIS VISIT) PATIENT SURVEYS:  Quick Dash 45.5% (50% at evaluation)   POSTURE: Rounded shoulder and forward head posture   PALPATION: Tender to palpation at [facility] joint and periacromial region, tender with palpable trigger points to infraspinatus region   UPPER EXTREMITY AROM/PROM:   A/PROM Right 09/11/2021 Left 09/11/2021 Right 09/25/2021 Right 10/02/2021  Shoulder flexion 160 160    Shoulder abduction 165 165    Shoulder extension 45 45    Shoulder internal rotation T8 T6 T7 T7  Shoulder external rotation T2 T2      UPPER EXTREMITY MMT:   MMT Right 09/11/2021 Left 09/11/2021 Right 10/02/2021  Shoulder flexion 4+ 5 5  Shoulder abduction 4+ 5 4+  Shoulder internal rotation 5 5 5   Shoulder external rotation 4 5 4   Periscapular musculature 4- 4- 4              TODAY'S TREATMENT:  10/02/2021:  Therapeutic Exercise: UBE L2 x 4 min (2 fwd/bwd) Doorway pec stretch at 90 deg abduction 3 x 30 sec Machine row superset with 45#: Low row 2 x10 Lat pull down 2 x 10 High row 2 x 10 Banded shoulder ER with blue 2 x 10 Scaption with blue 2 x 10 Manual:  Skilled palpation and monitoring of muscle tension while performing TPDN treatment GHJ mobs primarily inferior/posterior at various ranges Scap pinned posterior cuff stretching Trigger Point Dry Needling Treatment: Pre-treatment instruction: Patient instructed on dry needling rationale, procedures, and possible side effects including pain during treatment (achy,cramping feeling), bruising, drop of blood, lightheadedness, nausea, sweating. Patient Consent Given: Yes Education handout provided: No Muscles treated: Right infraspinatus, teres minor, upper trap, bicep Needle size and number: .30x93mm x  5 Electrical stimulation performed: No Parameters: N/A Treatment response/outcome: Twitch response elicited, Palpable decrease in muscle tension, and patient reported reduced soreness Post-treatment instructions: Patient instructed to expect possible mild to moderate muscle soreness later today and/or tomorrow. Patient instructed in methods to reduce muscle soreness and to continue prescribed HEP. If patient was dry needled over the lung field, patient was instructed on signs and symptoms of pneumothorax and, however unlikely, to see immediate medical attention should they occur. Patient was also educated on signs and symptoms of infection and to seek medical attention should they occur. Patient verbalized understanding of these instructions and education.   09/25/2021:  Therapeutic Exercise: UBE L2 x 4 min (2 fwd/bwd) Doorway pec stretch at 90 deg abduction 3 x 30 sec Banded shoulder ER with green 2 x 10 Row with black 2 x 10 Extension with green 2 x 10 Scaption with 3# 2 x 10 Manual: Skilled palpation and monitoring of muscle tension while performing TPDN treatment GHJ mobs primarily inferior/posterior at various ranges Scap pinned posterior cuff stretching Trigger Point Dry Needling Treatment: Pre-treatment instruction: Patient instructed on dry needling rationale, procedures, and possible side effects including pain during treatment (achy,cramping feeling), bruising, drop of blood, lightheadedness, nausea, sweating. Patient Consent Given: Yes Education handout provided: No Muscles treated: Right infraspinatus and teres minor Needle size and number: .30x66mm x 3 Electrical stimulation performed: No Parameters: N/A Treatment response/outcome: Twitch response elicited, Palpable decrease in muscle tension, and patient reported reduced soreness Post-treatment instructions: Patient instructed to expect possible mild to moderate muscle soreness later today and/or tomorrow. Patient instructed  in methods to reduce muscle soreness and to continue prescribed HEP. If patient was dry needled over the lung field, patient was instructed on signs and symptoms of pneumothorax and, however unlikely, to see immediate medical attention should they occur. Patient was also educated on signs and symptoms of infection and to seek medical attention should they occur. Patient verbalized understanding of these instructions and education.  09/11/2021: (evaluation)  Therapeutic Exercise: Sidelying cross body posterior cuff stretch 3 x 30 sec Doorway pec stretch at 90 deg abduction 3 x 30 sec Banded shoulder ER with green x 10 Row with green x 10   PATIENT EDUCATION: Education details: HEP update, progress toward goals Person educated: Patient Education method: Explanation, Demonstration, Tactile cues, Verbal cues, Handout Education comprehension: verbalized understanding, returned demonstration, verbal cues required, tactile cues required, and needs further education   HOME EXERCISE PROGRAM: Access Code: [email or web address]     ASSESSMENT: CLINICAL IMPRESSION: Patient tolerated therapy well with no adverse effects. He is progressing well with therapy and making progress toward goals, demonstrating improvement in his shoulder motion and strength and reporting improved functional ability with the right arm. He does continue to report pain with activity using the right shoulder and exhibits strength deficit of the rotator cuff and postural muscles compared to the opposite side. Patient does require occasional cueing for posture and proper scapular mechanics with his exercises. Updated his HEP this visit to progress his strengthening. Patient would benefit  from continued skilled PT to progress his mobility and strength in order to reduce pain and maximize functional ability.  Objective impairments include decreased ROM, decreased strength, postural dysfunction, and pain.     GOALS: Goals reviewed with patient?  Yes   SHORT TERM GOALS:   STG Name Target Date Goal status  1 Patient will be I with initial HEP in order to progress with therapy. Baseline: patient reports consistency with HEP 10/09/2021 ACHIEVED  2 Patient will report pain level with right shoulder activity </= 3/10 in order to improve ability to perform household tasks Baseline: 2-3/10 pain with right shoulder activity 10/09/2021 ACHIEVED    LONG TERM GOALS:    LTG Name Target Date Goal status  1 Patient will be I with final HEP to maintain progress from PT. Baseline: progressing 11/06/2021 ONGOING  2 Patient will report </= 25% on QuickDASH to indicate improvement in functional ability Baseline: 45.5%, improved from 50% at intake 11/06/2021 ONGOING  3 Patient will demonstrate right shoulder strength grossly 5/5 MMT in order to improve tolerance for right shoulder activity and lifting Baseline: patient demonstrates strength deficits of the right shoulder rotator cuff and postural muscles 11/06/2021 ONGOING  4 Patient will report pain level with right shoulder acitvity </= 1/10 in order to normalize right shoulder use Baseline: 2-3/10 pain at eval 11/06/2021 ONGOING      PLAN: PT FREQUENCY: 1x/week   PT DURATION: 8 weeks   PLANNED INTERVENTIONS: Therapeutic exercises, Therapeutic activity, Neuro Muscular re-education, Balance training, Gait training, Patient/Family education, Joint mobilization, Aquatic Therapy, Dry Needling, Electrical stimulation, Spinal mobilization, Cryotherapy, Moist heat, Taping, and Manual therapy   PLAN FOR NEXT SESSION: review HEP and progress PRN, manual/dry needling for posterior cuff, posterior cuff and pec stretch, trial MWM for GHJ and/or AC joint with shoulder elevation, rotator cuff and periscapular strengthening    Rosana Hoes, PT, DPT, LAT, ATC 10/02/21  10:00 AM Phone: [phone] Fax: [phone]

## 2021-10-02 ENCOUNTER — Ambulatory Visit: Payer: Medicaid Other | Admitting: Physical Therapy

## 2021-10-02 ENCOUNTER — Encounter: Payer: Self-pay | Admitting: Physical Therapy

## 2021-10-02 ENCOUNTER — Other Ambulatory Visit: Payer: Self-pay

## 2021-10-02 DIAGNOSIS — M25511 Pain in right shoulder: Secondary | ICD-10-CM | POA: Diagnosis not present

## 2021-10-02 DIAGNOSIS — G8929 Other chronic pain: Secondary | ICD-10-CM

## 2021-10-02 DIAGNOSIS — M6281 Muscle weakness (generalized): Secondary | ICD-10-CM

## 2021-10-02 NOTE — Patient Instructions (Signed)
Access Code: [email or web address] URL: [email or web address] Date: 10/02/2021 Prepared by: Rosana Hoes  Exercises Sidelying Posterior Cuff Cross Body Stretch - 1/4 Turn Back - 2 x daily - 3 reps - 30 seconds hold Doorway Pec Stretch at 90 Degrees Abduction - 2 x daily - 3 reps - 30 seconds hold Shoulder External Rotation with Anchored Resistance - 1 x daily - 3 sets - 10 reps Banded Row - 1 x daily - 3 sets - 10 reps Scaption with Resistance - 1 x daily - 3 sets - 10 reps

## 2021-10-05 NOTE — Therapy (Addendum)
OUTPATIENT PHYSICAL THERAPY TREATMENT NOTE   Patient Name: Logan Bailey MRN: [id number] DOB:1961/02/21, 61 y.o., male Today's Date: 10/09/2021  PCP: Kerin Perna, NP REFERRING PROVIDER: Kerin Perna, NP   PT End of Session - 10/09/21 [phone]     Visit Number 4    Number of Visits 8    Date for PT Re-Evaluation 11/06/21    Authorization Type MCD    Authorization - Visit Number 1    Authorization - Number of Visits 8    PT Start Time 0845    PT Stop Time 0935    PT Time Calculation (min) 50 min    Activity Tolerance Patient tolerated treatment well    Behavior During Therapy [facility] for tasks assessed/performed               Past Medical History:  Diagnosis Date   Depression    GERD (gastroesophageal reflux disease)    Seasonal allergies    Past Surgical History:  Procedure Laterality Date   HERNIA REPAIR  10/16/2012   incarcerated   INGUINAL HERNIA REPAIR Left 10/16/2012   Procedure: HERNIA REPAIR INGUINAL ADULT;  Surgeon: Ralene Ok, MD;  Location: [facility];  Service: General;  Laterality: Left;   Patient Active Problem List   Diagnosis Date Noted   Poor dentition 06/23/2018   Tobacco use disorder 06/23/2018   Depression with anxiety 06/23/2018   Chronic bilateral low back pain with bilateral sciatica 06/23/2018   Porokeratosis 01/13/2018    REFERRING PROVIDER: Persons, Bevely Palmer, PA   REFERRING DIAG: Chronic right shoulder pain  THERAPY DIAG:  Chronic right shoulder pain  Muscle weakness (generalized)  PERTINENT HISTORY: None  PRECAUTIONS: None  SUBJECTIVE: Patient reports his normal shoulder pain has gotten a lot better but he is having some increased pain on the outside of the arm now.   PAIN:  Are you having pain? Yes NPRS scale: 2/10 (2-3/10 when using the right shoulder) Pain location: Shoulder Pain orientation: Right PAIN TYPE: Chronic Pain description: intermittent, sore Aggravating factors: Using or raising the right  shoulder, lifting Relieving factors: Medication, injections, rest  PATIENT GOALS: Pain relief and improve ability to use right arm   OBJECTIVE: (BOLDED MEASURES ASSESSED THIS VISIT) PATIENT SURVEYS:  Quick Dash 45.5% - 10/02/2021 (50% at evaluation)   POSTURE: Rounded shoulder and forward head posture   PALPATION: Tender with palpable trigger points infraspinatus, supraspinatus, distal middle deltoid   UPPER EXTREMITY AROM/PROM:   A/PROM Right 09/11/2021 Left 09/11/2021 Right 09/25/2021 Right 10/02/2021  Shoulder flexion 160 160    Shoulder abduction 165 165    Shoulder extension 45 45    Shoulder internal rotation T8 T6 T7 T7  Shoulder external rotation T2 T2      UPPER EXTREMITY MMT:   MMT Right 09/11/2021 Left 09/11/2021 Right 10/02/2021 Right 10/09/2021  Shoulder flexion 4+ 5 5   Shoulder abduction 4+ 5 4+   Shoulder internal rotation _0 Shoulder external rotation _1 4+  Periscapular musculature 4- 4- 4               TODAY'S TREATMENT:  10/09/2021:  Therapeutic Exercise: UBE L2 x 4 min (2 fwd/bwd) Cross body posterior cuff stretch 3 x 30 sec Doorway pec stretch at 90 deg abduction 3 x 30 sec Machine row superset with 45#: Low row 2 x10 Lat pull down 2 x 10 High row 2 x 10 Banded shoulder ER with blue 3 x 15  Scaption with 4# 3 x 15 Manual: Skilled palpation and monitoring of muscle tension while performing TPDN treatment STM and MFR for right infraspinatus and deltoid Scap pinned posterior cuff stretching Trigger Point Dry Needling Treatment: Pre-treatment instruction: Patient instructed on dry needling rationale, procedures, and possible side effects including pain during treatment (achy,cramping feeling), bruising, drop of blood, lightheadedness, nausea, sweating. Patient Consent Given: Yes Education handout provided: No Muscles treated: Right infraspinatus, supraspinatus, middle deltoid Needle size and number: .30x67m x 3 Electrical stimulation  performed: No Parameters: N/A Treatment response/outcome: Twitch response elicited, Palpable decrease in muscle tension, and patient reported reduced lateral arm pain Post-treatment instructions: Patient instructed to expect possible mild to moderate muscle soreness later today and/or tomorrow. Patient instructed in methods to reduce muscle soreness and to continue prescribed HEP. If patient was dry needled over the lung field, patient was instructed on signs and symptoms of pneumothorax and, however unlikely, to see immediate medical attention should they occur. Patient was also educated on signs and symptoms of infection and to seek medical attention should they occur. Patient verbalized understanding of these instructions and education.   10/02/2021:  Therapeutic Exercise: UBE L2 x 4 min (2 fwd/bwd) Doorway pec stretch at 90 deg abduction 3 x 30 sec Machine row superset with 45#: Low row 2 x10 Lat pull down 2 x 10 High row 2 x 10 Banded shoulder ER with blue 2 x 10 Scaption with blue 2 x 10 Manual: Skilled palpation and monitoring of muscle tension while performing TPDN treatment GHJ mobs primarily inferior/posterior at various ranges Scap pinned posterior cuff stretching Trigger Point Dry Needling Treatment: Pre-treatment instruction: Patient instructed on dry needling rationale, procedures, and possible side effects including pain during treatment (achy,cramping feeling), bruising, drop of blood, lightheadedness, nausea, sweating. Patient Consent Given: Yes Education handout provided: No Muscles treated: Right infraspinatus, teres minor, upper trap, bicep Needle size and number: .30x526mx 5 Electrical stimulation performed: No Parameters: N/A Treatment response/outcome: Twitch response elicited, Palpable decrease in muscle tension, and patient reported reduced soreness Post-treatment instructions: Patient instructed to expect possible mild to moderate muscle soreness later today  and/or tomorrow. Patient instructed in methods to reduce muscle soreness and to continue prescribed HEP. If patient was dry needled over the lung field, patient was instructed on signs and symptoms of pneumothorax and, however unlikely, to see immediate medical attention should they occur. Patient was also educated on signs and symptoms of infection and to seek medical attention should they occur. Patient verbalized understanding of these instructions and education.  09/25/2021:  Therapeutic Exercise: UBE L2 x 4 min (2 fwd/bwd) Doorway pec stretch at 90 deg abduction 3 x 30 sec Banded shoulder ER with green 2 x 10 Row with black 2 x 10 Extension with green 2 x 10 Scaption with 3# 2 x 10 Manual: Skilled palpation and monitoring of muscle tension while performing TPDN treatment GHJ mobs primarily inferior/posterior at various ranges Scap pinned posterior cuff stretching Trigger Point Dry Needling Treatment: Pre-treatment instruction: Patient instructed on dry needling rationale, procedures, and possible side effects including pain during treatment (achy,cramping feeling), bruising, drop of blood, lightheadedness, nausea, sweating. Patient Consent Given: Yes Education handout provided: No Muscles treated: Right infraspinatus and teres minor Needle size and number: .30x5063m 3 Electrical stimulation performed: No Parameters: N/A Treatment response/outcome: Twitch response elicited, Palpable decrease in muscle tension, and patient reported reduced soreness Post-treatment instructions: Patient instructed to expect possible mild to moderate muscle soreness later today and/or tomorrow. Patient instructed  in methods to reduce muscle soreness and to continue prescribed HEP. If patient was dry needled over the lung field, patient was instructed on signs and symptoms of pneumothorax and, however unlikely, to see immediate medical attention should they occur. Patient was also educated on signs and symptoms  of infection and to seek medical attention should they occur. Patient verbalized understanding of these instructions and education.   PATIENT EDUCATION: Education details: HEP Person educated: Patient Education method: Education officer, environmental, Corporate treasurer cues, Verbal cues Education comprehension: verbalized understanding, returned demonstration, verbal cues required, tactile cues required, and needs further education   HOME EXERCISE PROGRAM: Access Code: [email or web address]     ASSESSMENT: CLINICAL IMPRESSION: Patient tolerated therapy well with no adverse effects. Therapy focuses on continued progression of of rotator cuff and periscapular strength, as well as shoulder and posterior cuff mobility to improve mechanics of shoulder motion. Patient seems to be responding well to therapy and demonstrates continued improvement in strength. He is tolerating greater resistance with exercise and denies any increased pain with therapy. He does require occasional cueing to avoid shoulder shrug and ensure proper posture with exercises. Performed TPDN this visit and focused more on right rotator cuff and deltoid with twitch responses elicited and patient reporting improvement in symptoms following treatment. No changes to HEP this visit. Patient would benefit from continued skilled PT to progress his mobility and strength in order to reduce pain and maximize functional ability.  Objective impairments include decreased ROM, decreased strength, postural dysfunction, and pain.     GOALS: Goals reviewed with patient? Yes   SHORT TERM GOALS:   STG Name Target Date Goal status  1 Patient will be I with initial HEP in order to progress with therapy. Baseline: patient reports consistency/independence with HEP 10/09/2021 ACHIEVED  2 Patient will report pain level with right shoulder activity </= 3/10 in order to improve ability to perform household tasks Baseline: patient reports 2-3/10 pain level with right shoulder  activity 10/09/2021 ACHIEVED    LONG TERM GOALS:    LTG Name Target Date Goal status  1 Patient will be I with final HEP to maintain progress from PT. Baseline: patient is progressing his strengthening and shoulder mobility exercises at home to improve shoulder mechanics in order to reduce pain 11/06/2021 PARTIALLY MET  2 Patient will report </= 25% on QuickDASH to indicate improvement in functional ability Baseline: 45.5% (improved from 50% at intake) 11/06/2021 PARTIALLY MET  3 Patient will demonstrate right shoulder strength grossly 5/5 MMT in order to improve tolerance for right shoulder activity and lifting Baseline: patient demonstrate 4+/5 MMT shoulder ER, 4+/5 shoulder abduction, 4/5 MMT periscapular strength 11/06/2021 PARTIALLY MET  4 Patient will report pain level with right shoulder acitvity </= 1/10 in order to normalize right shoulder use Baseline: patient reports right shoulder pain can reach 2-3/10 pain level with lifting or activity using right arm 11/06/2021 PARTIALLY MET      PLAN: PT FREQUENCY: 1x/week   PT DURATION: 8 weeks   PLANNED INTERVENTIONS: Therapeutic exercises, Therapeutic activity, Neuro Muscular re-education, Balance training, Gait training, Patient/Family education, Joint mobilization, Aquatic Therapy, Dry Needling, Electrical stimulation, Spinal mobilization, Cryotherapy, Moist heat, Taping, and Manual therapy   PLAN FOR NEXT SESSION: review HEP and progress PRN, manual/dry needling for posterior cuff, posterior cuff and pec stretch, trial MWM for GHJ and/or AC joint with shoulder elevation, rotator cuff and periscapular strengthening    Hilda Blades, PT, DPT, LAT, ATC 10/09/21  9:35 AM Phone: [phone] Fax: [phone]

## 2021-10-07 ENCOUNTER — Other Ambulatory Visit: Payer: Self-pay

## 2021-10-07 ENCOUNTER — Ambulatory Visit (INDEPENDENT_AMBULATORY_CARE_PROVIDER_SITE_OTHER): Payer: Medicaid Other | Admitting: Primary Care

## 2021-10-07 ENCOUNTER — Encounter (INDEPENDENT_AMBULATORY_CARE_PROVIDER_SITE_OTHER): Payer: Self-pay | Admitting: Primary Care

## 2021-10-07 VITALS — BP 162/97 | HR 49 | Temp 97.8°F | Ht 74.0 in | Wt 171.6 lb

## 2021-10-07 DIAGNOSIS — Z1211 Encounter for screening for malignant neoplasm of colon: Secondary | ICD-10-CM

## 2021-10-07 DIAGNOSIS — R351 Nocturia: Secondary | ICD-10-CM

## 2021-10-07 DIAGNOSIS — I10 Essential (primary) hypertension: Secondary | ICD-10-CM | POA: Diagnosis not present

## 2021-10-07 DIAGNOSIS — Z13220 Encounter for screening for lipoid disorders: Secondary | ICD-10-CM

## 2021-10-07 MED ORDER — HYDROCHLOROTHIAZIDE 25 MG PO TABS
25.0000 mg | ORAL_TABLET | Freq: Every day | ORAL | 3 refills | Status: DC
Start: 2021-10-07 — End: 2021-10-07
  Filled 2021-10-07: qty 90, 90d supply, fill #0

## 2021-10-07 NOTE — Progress Notes (Signed)
?[place] ? ?Logan Bailey, is a 61 y.o. male ? ?WSF:[id number] ? ?[id number] ? ?DOB - [birth year] ? ?Chief Complaint  ?Patient presents with  ? Annual Exam  ?    ? ?Subjective:  ? ?Logan Bailey is a 61 y.o. male here today for Annual Exam.  Patient has No headache, No chest pain, No abdominal pain - No Nausea, No new weakness tingling or numbness, No Cough -shortness of breath.. ? ?No problems updated. ? ?Allergies  ?Allergen Reactions  ? Bee Venom Anaphylaxis and Hives  ? ? ?Past Medical History:  ?Diagnosis Date  ? Depression   ? GERD (gastroesophageal reflux disease)   ? Seasonal allergies   ? ? ?Current Outpatient Medications on File Prior to Visit  ?Medication Sig Dispense Refill  ? EPINEPHrine 0.3 mg/0.3 mL IJ SOAJ injection Inject 0.3 mLs (0.3 mg total) into the muscle as needed for anaphylaxis. 1 each 0  ? gabapentin (NEURONTIN) 300 MG capsule take 1 capsule (300 mg total) by mouth three times daily 90 capsule 2  ? lumateperone tosylate (CAPLYTA) 42 MG capsule Take 1 capsule (42 mg total) by mouth daily. 30 capsule 2  ? vitamin B-12 (CYANOCOBALAMIN) 100 MCG tablet Take 200 mcg by mouth daily.     ? DULoxetine (CYMBALTA) 20 MG capsule Take 1 capsule (20 mg total) by mouth daily for 14 days. 14 capsule 0  ? OLANZapine (ZYPREXA) 5 MG tablet Take 1 tablet (5 mg total) by mouth at bedtime for 14 days. 14 tablet 0  ? omeprazole (PRILOSEC) 20 MG capsule TAKE 1 CAPSULE (20 MG TOTAL) BY MOUTH DAILY. 30 capsule 3  ? [DISCONTINUED] buPROPion (WELLBUTRIN XL) 150 MG 24 hr tablet Take 1 tablet (150 mg total) by mouth daily. For smoking cessation 90 tablet 1  ? [DISCONTINUED] famotidine (PEPCID) 20 MG tablet Take 1 tablet (20 mg total) by mouth 2 (two) times daily. (Patient not taking: No sig reported) 30 tablet 0  ? ?No current facility-administered medications on file prior to visit.  ? ? ?Objective:  ? ?Vitals:  ? 10/07/21 1027 10/07/21 1041  ?BP: (!) 166/102 (!) 162/97  ?Pulse: (!) 43 (!) 49   ?Temp: 97.8 ?F (36.6 ?C)   ?TempSrc: Oral   ?SpO2: 98%   ?Weight: 171 lb 9.6 oz (77.8 kg)   ?Height: _0  (1.88 m)   ? ? ?Exam ?General appearance : Awake, alert, not in any distress. Speech Clear. Not toxic looking ?HEENT: Atraumatic and Normocephalic, pupils equally reactive to light and accomodation.Left ear impaction  ?Neck: Supple, no JVD. No cervical lymphadenopathy.  ?Chest: Good air entry bilaterally, no added sounds  ?CVS: S1 S2 regular, no murmurs.  ?Abdomen: Bowel sounds present, Non tender and not distended with no gaurding, rigidity or rebound. ?Extremities: B/L Lower Ext shows no edema, both legs are warm to touch ?Neurology: Awake alert, and oriented X 3, CN II-XII intact, Non focal ?Skin: No Rash ? ?Data Review ?No results found for: HGBA1C ? ?Assessment & Plan  ? ?1. Essential hypertension ?BP goal - < 130/80 ?Explained that having normal blood pressure is the goal and medications are helping to get to goal and maintain normal blood pressure. ?DIET: Limit salt intake, read nutrition labels to check salt content, limit fried and high fatty foods  ?Avoid using multisymptom OTC cold preparations that generally contain sudafed which can rise BP. Consult with pharmacist on best cold relief products to use for persons with HTN ?EXERCISE ?Discussed incorporating exercise such as walking -  30 minutes most days of the week and can do in 10 minute intervals    ?- CMP14+EGFR ?- CBC with Differential ? ?2. Lipid screening ? Healthy lifestyle diet of fruits vegetables fish nuts whole grains and low saturated fat . Foods high in cholesterol or liver, fatty meats,cheese, butter avocados, nuts and seeds, chocolate and fried foods. ?- Lipid Panel ? ?3. Nocturia ?- PSA ? ?4. Colon cancer screening ?- Ambulatory referral to Gastroenterology ? ? ?Left ear impaction  ? ?Patient have been counseled extensively about nutrition and exercise. Other issues discussed during this visit include: low cholesterol diet, weight  control and daily exercise, foot care, annual eye examinations at Ophthalmology, importance of adherence with medications and regular follow-up. We also discussed long term complications of uncontrolled diabetes and hypertension.  ? ?Return in about 2 weeks (around 10/21/2021) for Bp. ? ?The patient was given clear instructions to go to ER or return to medical center if symptoms don't improve, worsen or new problems develop. The patient verbalized understanding. The patient was told to call to get lab results if they haven't heard anything in the next week.  ? ?This note has been created with Surveyor, quantity. Any transcriptional errors are unintentional.  ? ?Kerin Perna, NP ?10/11/2021, 9:38 PM  ?

## 2021-10-08 LAB — CMP14+EGFR
ALT: 27 IU/L (ref 0–44)
AST: 61 IU/L — ABNORMAL HIGH (ref 0–40)
Albumin/Globulin Ratio: 1.8 (ref 1.2–2.2)
Albumin: 4.5 g/dL (ref 3.8–4.9)
Alkaline Phosphatase: 73 IU/L (ref 44–121)
BUN/Creatinine Ratio: 11 (ref 10–24)
BUN: 10 mg/dL (ref 8–27)
Bilirubin Total: 0.4 mg/dL (ref 0.0–1.2)
CO2: 23 mmol/L (ref 20–29)
Calcium: 9.3 mg/dL (ref 8.6–10.2)
Chloride: 106 mmol/L (ref 96–106)
Creatinine, Ser: 0.91 mg/dL (ref 0.76–1.27)
Globulin, Total: 2.5 g/dL (ref 1.5–4.5)
Glucose: 84 mg/dL (ref 70–99)
Potassium: 3.9 mmol/L (ref 3.5–5.2)
Sodium: 144 mmol/L (ref 134–144)
Total Protein: 7 g/dL (ref 6.0–8.5)
eGFR: 96 mL/min/{1.73_m2} (ref >59)

## 2021-10-08 LAB — LIPID PANEL
Chol/HDL Ratio: 4.3 ratio (ref 0.0–5.0)
Cholesterol, Total: 187 mg/dL (ref 100–199)
HDL: 44 mg/dL (ref >39)
LDL Chol Calc (NIH): 127 mg/dL — ABNORMAL HIGH (ref 0–99)
Triglycerides: 86 mg/dL (ref 0–149)
VLDL Cholesterol Cal: 16 mg/dL (ref 5–40)

## 2021-10-08 LAB — CBC WITH DIFFERENTIAL/PLATELET
Basophils Absolute: 0 10*3/uL (ref 0.0–0.2)
Basos: 0 %
EOS (ABSOLUTE): 0.1 10*3/uL (ref 0.0–0.4)
Eos: 1 %
Hematocrit: 42 % (ref 37.5–51.0)
Hemoglobin: 14 g/dL (ref 13.0–17.7)
Immature Grans (Abs): 0 10*3/uL (ref 0.0–0.1)
Immature Granulocytes: 0 %
Lymphocytes Absolute: 1.9 10*3/uL (ref 0.7–3.1)
Lymphs: 38 %
MCH: 29.2 pg (ref 26.6–33.0)
MCHC: 33.3 g/dL (ref 31.5–35.7)
MCV: 88 fL (ref 79–97)
Monocytes Absolute: 0.4 10*3/uL (ref 0.1–0.9)
Monocytes: 8 %
Neutrophils Absolute: 2.6 10*3/uL (ref 1.4–7.0)
Neutrophils: 53 %
Platelets: 331 10*3/uL (ref 150–450)
RBC: 4.79 x10E6/uL (ref 4.14–5.80)
RDW: 13.3 % (ref 11.6–15.4)
WBC: 4.9 10*3/uL (ref 3.4–10.8)

## 2021-10-08 LAB — PSA: Prostate Specific Ag, Serum: 2.1 ng/mL (ref 0.0–4.0)

## 2021-10-09 ENCOUNTER — Other Ambulatory Visit: Payer: Self-pay

## 2021-10-09 ENCOUNTER — Encounter: Payer: Self-pay | Admitting: Physical Therapy

## 2021-10-09 ENCOUNTER — Ambulatory Visit: Payer: Medicaid Other | Attending: Physician Assistant | Admitting: Physical Therapy

## 2021-10-09 DIAGNOSIS — G8929 Other chronic pain: Secondary | ICD-10-CM | POA: Diagnosis present

## 2021-10-09 DIAGNOSIS — M25511 Pain in right shoulder: Secondary | ICD-10-CM | POA: Diagnosis present

## 2021-10-09 DIAGNOSIS — M6281 Muscle weakness (generalized): Secondary | ICD-10-CM | POA: Diagnosis present

## 2021-10-12 NOTE — Therapy (Incomplete)
OUTPATIENT PHYSICAL THERAPY TREATMENT NOTE   Patient Name: Logan Bailey MRN: [id number] DOB:1961/04/07, 61 y.o., male Today's Date: 10/12/2021  PCP: Kerin Perna, NP REFERRING PROVIDER: Persons, Bevely Palmer, [place]       Past Medical History:  Diagnosis Date   Depression    GERD (gastroesophageal reflux disease)    Seasonal allergies    Past Surgical History:  Procedure Laterality Date   HERNIA REPAIR  10/16/2012   incarcerated   INGUINAL HERNIA REPAIR Left 10/16/2012   Procedure: HERNIA REPAIR INGUINAL ADULT;  Surgeon: Ralene Ok, MD;  Location: [facility];  Service: General;  Laterality: Left;   Patient Active Problem List   Diagnosis Date Noted   Poor dentition 06/23/2018   Tobacco use disorder 06/23/2018   Depression with anxiety 06/23/2018   Chronic bilateral low back pain with bilateral sciatica 06/23/2018   Porokeratosis 01/13/2018    REFERRING PROVIDER: Persons, Bevely Palmer, PA   REFERRING DIAG: Chronic right shoulder pain  THERAPY DIAG:  No diagnosis found.  PERTINENT HISTORY: None  PRECAUTIONS: None  SUBJECTIVE: Patient reports his normal shoulder pain has gotten a lot better but he is having some increased pain on the outside of the arm now.   PAIN:  Are you having pain? Yes NPRS scale: 2/10 (2-3/10 when using the right shoulder) Pain location: Shoulder Pain orientation: Right PAIN TYPE: Chronic Pain description: intermittent, sore Aggravating factors: Using or raising the right shoulder, lifting Relieving factors: Medication, injections, rest  PATIENT GOALS: Pain relief and improve ability to use right arm   OBJECTIVE: (BOLDED MEASURES ASSESSED THIS VISIT) PATIENT SURVEYS:  Quick Dash 45.5% - 10/02/2021 (50% at evaluation)   POSTURE: Rounded shoulder and forward head posture   PALPATION: Tender with palpable trigger points infraspinatus, supraspinatus, distal middle deltoid   UPPER EXTREMITY AROM/PROM:   A/PROM Right 09/11/2021  Left 09/11/2021 Right 09/25/2021 Right 10/02/2021  Shoulder flexion 160 160    Shoulder abduction 165 165    Shoulder extension 45 45    Shoulder internal rotation T8 T6 T7 T7  Shoulder external rotation T2 T2      UPPER EXTREMITY MMT:   MMT Right 09/11/2021 Left 09/11/2021 Right 10/02/2021 Right 10/09/2021  Shoulder flexion 4+ 5 5   Shoulder abduction 4+ 5 4+   Shoulder internal rotation _0 Shoulder external rotation _1 4+  Periscapular musculature 4- 4- 4               TODAY'S TREATMENT:  10/16/2021:  Therapeutic Exercise: UBE L2 x 4 min (2 fwd/bwd) Cross body posterior cuff stretch 3 x 30 sec Doorway pec stretch at 90 deg abduction 3 x 30 sec Machine row superset with 45#: Low row 2 x10 Lat pull down 2 x 10 High row 2 x 10 Banded shoulder ER with blue 3 x 15 Scaption with 4# 3 x 15 Manual: Skilled palpation and monitoring of muscle tension while performing TPDN treatment STM and MFR for right infraspinatus and deltoid Scap pinned posterior cuff stretching Trigger Point Dry Needling Treatment: Pre-treatment instruction: Patient instructed on dry needling rationale, procedures, and possible side effects including pain during treatment (achy,cramping feeling), bruising, drop of blood, lightheadedness, nausea, sweating. Patient Consent Given: Yes Education handout provided: No Muscles treated: Right infraspinatus, supraspinatus, middle deltoid Needle size and number: .30x13m x 3 Electrical stimulation performed: No Parameters: N/A Treatment response/outcome: Twitch response elicited, Palpable decrease in muscle tension, and patient reported reduced lateral arm pain Post-treatment instructions: Patient  instructed to expect possible mild to moderate muscle soreness later today and/or tomorrow. Patient instructed in methods to reduce muscle soreness and to continue prescribed HEP. If patient was dry needled over the lung field, patient was instructed on signs and symptoms of  pneumothorax and, however unlikely, to see immediate medical attention should they occur. Patient was also educated on signs and symptoms of infection and to seek medical attention should they occur. Patient verbalized understanding of these instructions and education.   10/09/2021:  Therapeutic Exercise: UBE L2 x 4 min (2 fwd/bwd) Cross body posterior cuff stretch 3 x 30 sec Doorway pec stretch at 90 deg abduction 3 x 30 sec Machine row superset with 45#: Low row 2 x10 Lat pull down 2 x 10 High row 2 x 10 Banded shoulder ER with blue 3 x 15 Scaption with 4# 3 x 15 Manual: Skilled palpation and monitoring of muscle tension while performing TPDN treatment STM and MFR for right infraspinatus and deltoid Scap pinned posterior cuff stretching Trigger Point Dry Needling Treatment: Pre-treatment instruction: Patient instructed on dry needling rationale, procedures, and possible side effects including pain during treatment (achy,cramping feeling), bruising, drop of blood, lightheadedness, nausea, sweating. Patient Consent Given: Yes Education handout provided: No Muscles treated: Right infraspinatus, supraspinatus, middle deltoid Needle size and number: .30x61m x 3 Electrical stimulation performed: No Parameters: N/A Treatment response/outcome: Twitch response elicited, Palpable decrease in muscle tension, and patient reported reduced lateral arm pain Post-treatment instructions: Patient instructed to expect possible mild to moderate muscle soreness later today and/or tomorrow. Patient instructed in methods to reduce muscle soreness and to continue prescribed HEP. If patient was dry needled over the lung field, patient was instructed on signs and symptoms of pneumothorax and, however unlikely, to see immediate medical attention should they occur. Patient was also educated on signs and symptoms of infection and to seek medical attention should they occur. Patient verbalized understanding of these  instructions and education.  10/02/2021:  Therapeutic Exercise: UBE L2 x 4 min (2 fwd/bwd) Doorway pec stretch at 90 deg abduction 3 x 30 sec Machine row superset with 45#: Low row 2 x10 Lat pull down 2 x 10 High row 2 x 10 Banded shoulder ER with blue 2 x 10 Scaption with blue 2 x 10 Manual: Skilled palpation and monitoring of muscle tension while performing TPDN treatment GHJ mobs primarily inferior/posterior at various ranges Scap pinned posterior cuff stretching Trigger Point Dry Needling Treatment: Pre-treatment instruction: Patient instructed on dry needling rationale, procedures, and possible side effects including pain during treatment (achy,cramping feeling), bruising, drop of blood, lightheadedness, nausea, sweating. Patient Consent Given: Yes Education handout provided: No Muscles treated: Right infraspinatus, teres minor, upper trap, bicep Needle size and number: .30x565mx 5 Electrical stimulation performed: No Parameters: N/A Treatment response/outcome: Twitch response elicited, Palpable decrease in muscle tension, and patient reported reduced soreness Post-treatment instructions: Patient instructed to expect possible mild to moderate muscle soreness later today and/or tomorrow. Patient instructed in methods to reduce muscle soreness and to continue prescribed HEP. If patient was dry needled over the lung field, patient was instructed on signs and symptoms of pneumothorax and, however unlikely, to see immediate medical attention should they occur. Patient was also educated on signs and symptoms of infection and to seek medical attention should they occur. Patient verbalized understanding of these instructions and education.   PATIENT EDUCATION: Education details: HEP Person educated: Patient Education method: Explanation, Demonstration, [employer], Verbal cues Education comprehension: verbalized understanding, returned demonstration,  verbal cues required, tactile cues  required, and needs further education   HOME EXERCISE PROGRAM: Access Code: [email or web address]     ASSESSMENT: CLINICAL IMPRESSION: Patient tolerated therapy well with no adverse effects. *** Patient would benefit from continued skilled PT to progress his mobility and strength in order to reduce pain and maximize functional ability.  Therapy focuses on continued progression of of rotator cuff and periscapular strength, as well as shoulder and posterior cuff mobility to improve mechanics of shoulder motion. Patient seems to be responding well to therapy and demonstrates continued improvement in strength. He is tolerating greater resistance with exercise and denies any increased pain with therapy. He does require occasional cueing to avoid shoulder shrug and ensure proper posture with exercises. Performed TPDN this visit and focused more on right rotator cuff and deltoid with twitch responses elicited and patient reporting improvement in symptoms following treatment. No changes to HEP this visit.   Objective impairments include decreased ROM, decreased strength, postural dysfunction, and pain.     GOALS: Goals reviewed with patient? Yes   SHORT TERM GOALS:   STG Name Target Date Goal status  1 Patient will be I with initial HEP in order to progress with therapy. Baseline: patient reports consistency/independence with HEP 10/09/2021 ACHIEVED  2 Patient will report pain level with right shoulder activity </= 3/10 in order to improve ability to perform household tasks Baseline: patient reports 2-3/10 pain level with right shoulder activity 10/09/2021 ACHIEVED    LONG TERM GOALS:    LTG Name Target Date Goal status  1 Patient will be I with final HEP to maintain progress from PT. Baseline: patient is progressing his strengthening and shoulder mobility exercises at home to improve shoulder mechanics in order to reduce pain 11/06/2021 PARTIALLY MET  2 Patient will report </= 25% on QuickDASH to indicate  improvement in functional ability Baseline: 45.5% (improved from 50% at intake) 11/06/2021 PARTIALLY MET  3 Patient will demonstrate right shoulder strength grossly 5/5 MMT in order to improve tolerance for right shoulder activity and lifting Baseline: patient demonstrate 4+/5 MMT shoulder ER, 4+/5 shoulder abduction, 4/5 MMT periscapular strength 11/06/2021 PARTIALLY MET  4 Patient will report pain level with right shoulder acitvity </= 1/10 in order to normalize right shoulder use Baseline: patient reports right shoulder pain can reach 2-3/10 pain level with lifting or activity using right arm 11/06/2021 PARTIALLY MET      PLAN: PT FREQUENCY: 1x/week   PT DURATION: 8 weeks   PLANNED INTERVENTIONS: Therapeutic exercises, Therapeutic activity, Neuro Muscular re-education, Balance training, Gait training, Patient/Family education, Joint mobilization, Aquatic Therapy, Dry Needling, Electrical stimulation, Spinal mobilization, Cryotherapy, Moist heat, Taping, and Manual therapy   PLAN FOR NEXT SESSION: review HEP and progress PRN, manual/dry needling for posterior cuff, posterior cuff and pec stretch, trial MWM for GHJ and/or AC joint with shoulder elevation, rotator cuff and periscapular strengthening    Hilda Blades, PT, DPT, LAT, ATC 10/12/21  8:18 AM Phone: [phone] Fax: [phone]

## 2021-10-13 ENCOUNTER — Telehealth (INDEPENDENT_AMBULATORY_CARE_PROVIDER_SITE_OTHER): Payer: Self-pay

## 2021-10-13 NOTE — Telephone Encounter (Signed)
Patient aware of normal labs. Tiffnay Bossi S Barbarita Hutmacher, CMA  

## 2021-10-13 NOTE — Telephone Encounter (Signed)
-----   Message from Grayce Sessions, NP sent at 10/12/2021  8:45 PM EST ----- ?Labs are  normal. Try to drink at least 48 oz of water per day. Work on eating a low fat, heart healthy diet and participate in regular aerobic exercise program to control as well. Exercise at least  30 minutes per day-5 days per week. Monitor eating red meat, fried foods,  junk foods, sodas, sugary foods or drinks, unhealthy snacking, alcohol or smoking.    ?  ?

## 2021-10-16 ENCOUNTER — Emergency Department (HOSPITAL_COMMUNITY)
Admission: EM | Admit: 2021-10-16 | Discharge: 2021-10-16 | Disposition: A | Discharge status: Home / Self Care | Payer: Medicaid Other | Attending: Emergency Medicine | Admitting: Emergency Medicine

## 2021-10-16 ENCOUNTER — Other Ambulatory Visit: Payer: Self-pay

## 2021-10-16 ENCOUNTER — Emergency Department (HOSPITAL_COMMUNITY): Payer: Medicaid Other

## 2021-10-16 ENCOUNTER — Encounter (HOSPITAL_COMMUNITY): Payer: Self-pay | Admitting: Emergency Medicine

## 2021-10-16 ENCOUNTER — Ambulatory Visit: Payer: Medicaid Other | Admitting: Physical Therapy

## 2021-10-16 ENCOUNTER — Telehealth: Payer: Self-pay | Admitting: Physical Therapy

## 2021-10-16 DIAGNOSIS — R0781 Pleurodynia: Secondary | ICD-10-CM | POA: Diagnosis not present

## 2021-10-16 DIAGNOSIS — S00211A Abrasion of right eyelid and periocular area, initial encounter: Secondary | ICD-10-CM | POA: Insufficient documentation

## 2021-10-16 DIAGNOSIS — Y92521 Bus station as the place of occurrence of the external cause: Secondary | ICD-10-CM | POA: Insufficient documentation

## 2021-10-16 DIAGNOSIS — S0081XA Abrasion of other part of head, initial encounter: Secondary | ICD-10-CM | POA: Diagnosis present

## 2021-10-16 MED ORDER — NAPROXEN 500 MG PO TABS
500.0000 mg | ORAL_TABLET | Freq: Two times a day (BID) | ORAL | 0 refills | Status: DC
  Filled 2021-10-16: qty 30, 15d supply, fill #0
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
## 2021-10-16 NOTE — Telephone Encounter (Signed)
Attempted to contact patient due to missed PT appointment. No answer and unable to leave VM. ? ?Rosana Hoes, PT, DPT, LAT, ATC ?10/16/21  12:25 PM ?Phone: [phone] ?Fax: [phone] ? ?
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
## 2021-10-16 NOTE — Discharge Instructions (Signed)
Take the prescribed medication as directed-- sent to pharmacy for you.  Use incentive spirometer a few times a day to help keep breathing strong. ?Follow-up with your primary care doctor. ?Return to the ED for new or worsening symptoms. ?

## 2021-10-19 NOTE — Therapy (Incomplete)
OUTPATIENT PHYSICAL THERAPY TREATMENT NOTE   Patient Name: Logan Bailey MRN: [id number] DOB:May 29, 1961, 61 y.o., male Today's Date: 10/19/2021  PCP: Kerin Perna, NP REFERRING PROVIDER: Persons, Bevely Palmer, [place]       Past Medical History:  Diagnosis Date   Depression    GERD (gastroesophageal reflux disease)    Seasonal allergies    Past Surgical History:  Procedure Laterality Date   HERNIA REPAIR  10/16/2012   incarcerated   INGUINAL HERNIA REPAIR Left 10/16/2012   Procedure: HERNIA REPAIR INGUINAL ADULT;  Surgeon: Ralene Ok, MD;  Location: [facility];  Service: General;  Laterality: Left;   Patient Active Problem List   Diagnosis Date Noted   Poor dentition 06/23/2018   Tobacco use disorder 06/23/2018   Depression with anxiety 06/23/2018   Chronic bilateral low back pain with bilateral sciatica 06/23/2018   Porokeratosis 01/13/2018    REFERRING PROVIDER: Persons, Bevely Palmer, PA   REFERRING DIAG: Chronic right shoulder pain  THERAPY DIAG:  No diagnosis found.  PERTINENT HISTORY: None  PRECAUTIONS: None  SUBJECTIVE: Patient reports his normal shoulder pain has gotten a lot better but he is having some increased pain on the outside of the arm now.   PAIN:  Are you having pain? Yes NPRS scale: 2/10 (2-3/10 when using the right shoulder) Pain location: Shoulder Pain orientation: Right PAIN TYPE: Chronic Pain description: intermittent, sore Aggravating factors: Using or raising the right shoulder, lifting Relieving factors: Medication, injections, rest  PATIENT GOALS: Pain relief and improve ability to use right arm   OBJECTIVE: (BOLDED MEASURES ASSESSED THIS VISIT) PATIENT SURVEYS:  Quick Dash 45.5% - 10/02/2021 (50% at evaluation)   POSTURE: Rounded shoulder and forward head posture   PALPATION: Tender with palpable trigger points infraspinatus, supraspinatus, distal middle deltoid   UPPER EXTREMITY AROM/PROM:   A/PROM Right 09/11/2021  Left 09/11/2021 Right 09/25/2021 Right 10/02/2021  Shoulder flexion 160 160    Shoulder abduction 165 165    Shoulder extension 45 45    Shoulder internal rotation T8 T6 T7 T7  Shoulder external rotation T2 T2      UPPER EXTREMITY MMT:   MMT Right 09/11/2021 Left 09/11/2021 Right 10/02/2021 Right 10/09/2021  Shoulder flexion 4+ 5 5   Shoulder abduction 4+ 5 4+   Shoulder internal rotation _0 Shoulder external rotation _1 4+  Periscapular musculature 4- 4- 4               TODAY'S TREATMENT:  10/23/2021:  Therapeutic Exercise: UBE L2 x 4 min (2 fwd/bwd) Cross body posterior cuff stretch 3 x 30 sec Doorway pec stretch at 90 deg abduction 3 x 30 sec Machine row superset with 45#: Low row 2 x10 Lat pull down 2 x 10 High row 2 x 10 Banded shoulder ER with blue 3 x 15 Scaption with 4# 3 x 15 Manual: Skilled palpation and monitoring of muscle tension while performing TPDN treatment STM and MFR for right infraspinatus and deltoid Scap pinned posterior cuff stretching Trigger Point Dry Needling Treatment: Pre-treatment instruction: Patient instructed on dry needling rationale, procedures, and possible side effects including pain during treatment (achy,cramping feeling), bruising, drop of blood, lightheadedness, nausea, sweating. Patient Consent Given: Yes Education handout provided: No Muscles treated: Right infraspinatus, supraspinatus, middle deltoid Needle size and number: .30x36m x 3 Electrical stimulation performed: No Parameters: N/A Treatment response/outcome: Twitch response elicited, Palpable decrease in muscle tension, and patient reported reduced lateral arm pain Post-treatment instructions: Patient  instructed to expect possible mild to moderate muscle soreness later today and/or tomorrow. Patient instructed in methods to reduce muscle soreness and to continue prescribed HEP. If patient was dry needled over the lung field, patient was instructed on signs and symptoms of  pneumothorax and, however unlikely, to see immediate medical attention should they occur. Patient was also educated on signs and symptoms of infection and to seek medical attention should they occur. Patient verbalized understanding of these instructions and education.   10/09/2021:  Therapeutic Exercise: UBE L2 x 4 min (2 fwd/bwd) Cross body posterior cuff stretch 3 x 30 sec Doorway pec stretch at 90 deg abduction 3 x 30 sec Machine row superset with 45#: Low row 2 x10 Lat pull down 2 x 10 High row 2 x 10 Banded shoulder ER with blue 3 x 15 Scaption with 4# 3 x 15 Manual: Skilled palpation and monitoring of muscle tension while performing TPDN treatment STM and MFR for right infraspinatus and deltoid Scap pinned posterior cuff stretching Trigger Point Dry Needling Treatment: Pre-treatment instruction: Patient instructed on dry needling rationale, procedures, and possible side effects including pain during treatment (achy,cramping feeling), bruising, drop of blood, lightheadedness, nausea, sweating. Patient Consent Given: Yes Education handout provided: No Muscles treated: Right infraspinatus, supraspinatus, middle deltoid Needle size and number: .30x61m x 3 Electrical stimulation performed: No Parameters: N/A Treatment response/outcome: Twitch response elicited, Palpable decrease in muscle tension, and patient reported reduced lateral arm pain Post-treatment instructions: Patient instructed to expect possible mild to moderate muscle soreness later today and/or tomorrow. Patient instructed in methods to reduce muscle soreness and to continue prescribed HEP. If patient was dry needled over the lung field, patient was instructed on signs and symptoms of pneumothorax and, however unlikely, to see immediate medical attention should they occur. Patient was also educated on signs and symptoms of infection and to seek medical attention should they occur. Patient verbalized understanding of these  instructions and education.  10/02/2021:  Therapeutic Exercise: UBE L2 x 4 min (2 fwd/bwd) Doorway pec stretch at 90 deg abduction 3 x 30 sec Machine row superset with 45#: Low row 2 x10 Lat pull down 2 x 10 High row 2 x 10 Banded shoulder ER with blue 2 x 10 Scaption with blue 2 x 10 Manual: Skilled palpation and monitoring of muscle tension while performing TPDN treatment GHJ mobs primarily inferior/posterior at various ranges Scap pinned posterior cuff stretching Trigger Point Dry Needling Treatment: Pre-treatment instruction: Patient instructed on dry needling rationale, procedures, and possible side effects including pain during treatment (achy,cramping feeling), bruising, drop of blood, lightheadedness, nausea, sweating. Patient Consent Given: Yes Education handout provided: No Muscles treated: Right infraspinatus, teres minor, upper trap, bicep Needle size and number: .30x565mx 5 Electrical stimulation performed: No Parameters: N/A Treatment response/outcome: Twitch response elicited, Palpable decrease in muscle tension, and patient reported reduced soreness Post-treatment instructions: Patient instructed to expect possible mild to moderate muscle soreness later today and/or tomorrow. Patient instructed in methods to reduce muscle soreness and to continue prescribed HEP. If patient was dry needled over the lung field, patient was instructed on signs and symptoms of pneumothorax and, however unlikely, to see immediate medical attention should they occur. Patient was also educated on signs and symptoms of infection and to seek medical attention should they occur. Patient verbalized understanding of these instructions and education.   PATIENT EDUCATION: Education details: HEP Person educated: Patient Education method: Explanation, Demonstration, [employer], Verbal cues Education comprehension: verbalized understanding, returned demonstration,  verbal cues required, tactile cues  required, and needs further education   HOME EXERCISE PROGRAM: Access Code: [email or web address]     ASSESSMENT: CLINICAL IMPRESSION: Patient tolerated therapy well with no adverse effects. *** Patient would benefit from continued skilled PT to progress his mobility and strength in order to reduce pain and maximize functional ability.  Therapy focuses on continued progression of of rotator cuff and periscapular strength, as well as shoulder and posterior cuff mobility to improve mechanics of shoulder motion. Patient seems to be responding well to therapy and demonstrates continued improvement in strength. He is tolerating greater resistance with exercise and denies any increased pain with therapy. He does require occasional cueing to avoid shoulder shrug and ensure proper posture with exercises. Performed TPDN this visit and focused more on right rotator cuff and deltoid with twitch responses elicited and patient reporting improvement in symptoms following treatment. No changes to HEP this visit.   Objective impairments include decreased ROM, decreased strength, postural dysfunction, and pain.     GOALS: Goals reviewed with patient? Yes   SHORT TERM GOALS:   STG Name Target Date Goal status  1 Patient will be I with initial HEP in order to progress with therapy. Baseline: patient reports consistency/independence with HEP 10/09/2021 ACHIEVED  2 Patient will report pain level with right shoulder activity </= 3/10 in order to improve ability to perform household tasks Baseline: patient reports 2-3/10 pain level with right shoulder activity 10/09/2021 ACHIEVED    LONG TERM GOALS:    LTG Name Target Date Goal status  1 Patient will be I with final HEP to maintain progress from PT. Baseline: patient is progressing his strengthening and shoulder mobility exercises at home to improve shoulder mechanics in order to reduce pain 11/06/2021 PARTIALLY MET  2 Patient will report </= 25% on QuickDASH to indicate  improvement in functional ability Baseline: 45.5% (improved from 50% at intake) 11/06/2021 PARTIALLY MET  3 Patient will demonstrate right shoulder strength grossly 5/5 MMT in order to improve tolerance for right shoulder activity and lifting Baseline: patient demonstrate 4+/5 MMT shoulder ER, 4+/5 shoulder abduction, 4/5 MMT periscapular strength 11/06/2021 PARTIALLY MET  4 Patient will report pain level with right shoulder acitvity </= 1/10 in order to normalize right shoulder use Baseline: patient reports right shoulder pain can reach 2-3/10 pain level with lifting or activity using right arm 11/06/2021 PARTIALLY MET      PLAN: PT FREQUENCY: 1x/week   PT DURATION: 8 weeks   PLANNED INTERVENTIONS: Therapeutic exercises, Therapeutic activity, Neuro Muscular re-education, Balance training, Gait training, Patient/Family education, Joint mobilization, Aquatic Therapy, Dry Needling, Electrical stimulation, Spinal mobilization, Cryotherapy, Moist heat, Taping, and Manual therapy   PLAN FOR NEXT SESSION: review HEP and progress PRN, manual/dry needling for posterior cuff, posterior cuff and pec stretch, trial MWM for GHJ and/or AC joint with shoulder elevation, rotator cuff and periscapular strengthening    Hilda Blades, PT, DPT, LAT, ATC 10/19/21  5:05 PM Phone: [phone] Fax: [phone]

## 2021-10-23 ENCOUNTER — Other Ambulatory Visit: Payer: Self-pay

## 2021-10-23 ENCOUNTER — Encounter: Payer: Self-pay | Admitting: Physical Therapy

## 2021-10-23 ENCOUNTER — Ambulatory Visit: Payer: Medicaid Other | Admitting: Physical Therapy

## 2021-10-23 DIAGNOSIS — G8929 Other chronic pain: Secondary | ICD-10-CM

## 2021-10-23 DIAGNOSIS — M6281 Muscle weakness (generalized): Secondary | ICD-10-CM

## 2021-10-23 DIAGNOSIS — M25511 Pain in right shoulder: Secondary | ICD-10-CM | POA: Diagnosis not present

## 2021-10-26 NOTE — Therapy (Signed)
?OUTPATIENT PHYSICAL THERAPY TREATMENT NOTE ? ?DISCHARGE ? ? ?Patient Name: Logan Bailey ?MRN: [id number] ?DOB:September 29, 1960, 61 y.o., male ?Today's Date: 10/30/2021 ? ?PCP: Grayce SessionsEdwards, Michelle P, NP ?REFERRING PROVIDER: Grayce SessionsEdwards, Michelle P, NP ? ? PT End of Session - 10/30/21 1007   ? ? Visit Number 6   ? Number of Visits 8   ? Date for PT Re-Evaluation 11/06/21   ? Authorization Type MCD   ? Authorization Time Period 10/12/2021 - 12/06/2021   ? Authorization - Visit Number 2   ? Authorization - Number of Visits 8   ? PT Start Time 1000   ? PT Stop Time 1045   ? PT Time Calculation (min) 45 min   ? Activity Tolerance Patient tolerated treatment well   ? [facility] for tasks assessed/performed   ? ?  ?  ? ?  ? ? ? ? ? ? ?Past Medical History:  ?Diagnosis Date  ? Depression   ? GERD (gastroesophageal reflux disease)   ? Seasonal allergies   ? ?Past Surgical History:  ?Procedure Laterality Date  ? HERNIA REPAIR  10/16/2012  ? incarcerated  ? INGUINAL HERNIA REPAIR Left 10/16/2012  ? Procedure: HERNIA REPAIR INGUINAL ADULT;  Surgeon: Axel FillerArmando Ramirez, MD;  [facility];  Service: General;  Laterality: Left;  ? ?Patient Active Problem List  ? Diagnosis Date Noted  ? Poor dentition 06/23/2018  ? Tobacco use disorder 06/23/2018  ? Depression with anxiety 06/23/2018  ? Chronic bilateral low back pain with bilateral sciatica 06/23/2018  ? Porokeratosis 01/13/2018  ? ? ?REFERRING PROVIDER: Persons, [place], [place] ?  ?REFERRING DIAG: Chronic right shoulder pain ? ?THERAPY DIAG:  ?Chronic right shoulder pain ? ?Muscle weakness (generalized) ? ?PERTINENT HISTORY: None ? ?PRECAUTIONS: None ? ?SUBJECTIVE: Patient reports shoulder continues to do well with no new issues. He is independent with his HEP. ? ?PAIN:  ?Are you having pain? No ?NPRS scale: 0/10  ?Pain location: Shoulder ?Pain orientation: Right ?PAIN TYPE: Chronic ?Pain description: intermittent, ache ?Aggravating factors: Using or raising the right shoulder,  lifting ?Relieving factors: Medication, injections, rest ? ?PATIENT GOALS: Pain relief and improve ability to use right arm ? ? ?OBJECTIVE: (BOLDED MEASURES ASSESSED THIS VISIT) ?PATIENT SURVEYS:  ?Quick Dash 13.6% - 10/30/2021 (45.5% - 10/02/2021, 50% at evaluation) ?  ?POSTURE: ?Rounded shoulder and forward head posture ?  ?PALPATION: ?Tender with palpable trigger points infraspinatus, supraspinatus, distal middle deltoid ?  ?UPPER EXTREMITY AROM/PROM: ?  ?A/PROM Right ?09/11/2021 Left ?09/11/2021 Right ?09/25/2021 Right ?10/02/2021 Right ?10/30/2021  ?Shoulder flexion 160 160     ?Shoulder abduction 165 165     ?Shoulder extension 45 45     ?Shoulder internal rotation T8 T6 T7 T7 T6  ?Shoulder external rotation T2 T2     ?  ?UPPER EXTREMITY MMT: ?  ?MMT Right ?09/11/2021 Left ?09/11/2021 Right ?10/02/2021 Right ?10/09/2021 Right ?10/23/2021 Right ?10/30/2021  ?Shoulder flexion 4+ 5 5   5   ?Shoulder abduction 4+ 5 4+  5 5  ?Shoulder internal rotation 5 5 5   5   ?Shoulder external rotation 4 5 4  4+ 4+ 5  ?Periscapular musculature 4- 4- 4  4   ?           ? ?TODAY'S TREATMENT:  ?10/30/2021: ? Therapeutic Exercise: ?UBE L2 x 4 min (2 fwd/bwd) ?Doorway pec stretch at 90 deg abduction 2 x 30 sec ?Machine row superset with 55#: ?Low row 2 x 15 ?Lat pull down 2 x  10 ?High row 2 x 10 ?Banded shoulder ER with blue 2 x 15 ?Scaption with 5# 2 x 15 ?Manual: ?Skilled palpation and monitoring of muscle tension while performing TPDN treatment ?STM and MFR for right infraspinatus, deltoid, bicep ?Trigger Point Dry Needling Treatment: ?Pre-treatment instruction: Patient instructed on dry needling rationale, procedures, and possible side effects including pain during treatment (achy,cramping feeling), bruising, drop of blood, lightheadedness, nausea, sweating. ?Patient Consent Given: Yes ?Education handout provided: No ?Muscles treated: Right upper trap, infraspinatus, supraspinatus, posterior and middle deltoid, pec major ?Needle size and number:  .30x87mm x 6 ?Electrical stimulation performed: No ?Parameters: N/A ?Treatment response/outcome: Twitch response elicited, Palpable decrease in muscle tension, and patient reported reduced lateral arm pain ?Post-treatment instructions: Patient instructed to expect possible mild to moderate muscle soreness later today and/or tomorrow. Patient instructed in methods to reduce muscle soreness and to continue prescribed HEP. If patient was dry needled over the lung field, patient was instructed on signs and symptoms of pneumothorax and, however unlikely, to see immediate medical attention should they occur. Patient was also educated on signs and symptoms of infection and to seek medical attention should they occur. Patient verbalized understanding of these instructions and education. ? ? ?10/23/2021: ? Therapeutic Exercise: ?UBE L2 x 4 min (2 fwd/bwd) ?Doorway pec stretch at 90 deg abduction 3 x 30 sec ?Machine row superset with 55#: ?Low row 2 x 15 ?Lat pull down 2 x 10 ?High row 2 x 10 ?Banded shoulder ER with blue 3 x 15 ?Scaption with 5# 3 x 15 ?Manual: ?Skilled palpation and monitoring of muscle tension while performing TPDN treatment ?STM and MFR for right infraspinatus, deltoid, bicep ?Trigger Point Dry Needling Treatment: ?Pre-treatment instruction: Patient instructed on dry needling rationale, procedures, and possible side effects including pain during treatment (achy,cramping feeling), bruising, drop of blood, lightheadedness, nausea, sweating. ?Patient Consent Given: Yes ?Education handout provided: No ?Muscles treated: Right infraspinatus, supraspinatus, middle deltoid, bicep ?Needle size and number: .30x68mm x 5 ?Electrical stimulation performed: No ?Parameters: N/A ?Treatment response/outcome: Twitch response elicited, Palpable decrease in muscle tension, and patient reported reduced lateral arm pain ?Post-treatment instructions: Patient instructed to expect possible mild to moderate muscle soreness later  today and/or tomorrow. Patient instructed in methods to reduce muscle soreness and to continue prescribed HEP. If patient was dry needled over the lung field, patient was instructed on signs and symptoms of pneumothorax and, however unlikely, to see immediate medical attention should they occur. Patient was also educated on signs and symptoms of infection and to seek medical attention should they occur. Patient verbalized understanding of these instructions and education. ? ?10/09/2021: ? Therapeutic Exercise: ?UBE L2 x 4 min (2 fwd/bwd) ?Cross body posterior cuff stretch 3 x 30 sec ?Doorway pec stretch at 90 deg abduction 3 x 30 sec ?Machine row superset with 45#: ?Low row 2 x10 ?Lat pull down 2 x 10 ?High row 2 x 10 ?Banded shoulder ER with blue 3 x 15 ?Scaption with 4# 3 x 15 ?Manual: ?Skilled palpation and monitoring of muscle tension while performing TPDN treatment ?STM and MFR for right infraspinatus and deltoid ?Scap pinned posterior cuff stretching ?Trigger Point Dry Needling Treatment: ?Pre-treatment instruction: Patient instructed on dry needling rationale, procedures, and possible side effects including pain during treatment (achy,cramping feeling), bruising, drop of blood, lightheadedness, nausea, sweating. ?Patient Consent Given: Yes ?Education handout provided: No ?Muscles treated: Right infraspinatus, supraspinatus, middle deltoid ?Needle size and number: .30x78mm x 3 ?Electrical stimulation performed: No ?Parameters: N/A ?Treatment response/outcome:  Twitch response elicited, Palpable decrease in muscle tension, and patient reported reduced lateral arm pain ?Post-treatment instructions: Patient instructed to expect possible mild to moderate muscle soreness later today and/or tomorrow. Patient instructed in methods to reduce muscle soreness and to continue prescribed HEP. If patient was dry needled over the lung field, patient was instructed on signs and symptoms of pneumothorax and, however unlikely, to  see immediate medical attention should they occur. Patient was also educated on signs and symptoms of infection and to seek medical attention should they occur. Patient verbalized understanding of these i

## 2021-10-30 ENCOUNTER — Other Ambulatory Visit: Payer: Self-pay

## 2021-10-30 ENCOUNTER — Ambulatory Visit: Payer: Medicaid Other | Admitting: Physical Therapy

## 2021-10-30 ENCOUNTER — Encounter: Payer: Self-pay | Admitting: Physical Therapy

## 2021-10-30 DIAGNOSIS — G8929 Other chronic pain: Secondary | ICD-10-CM

## 2021-10-30 DIAGNOSIS — M25511 Pain in right shoulder: Secondary | ICD-10-CM | POA: Diagnosis not present

## 2021-10-30 DIAGNOSIS — M6281 Muscle weakness (generalized): Secondary | ICD-10-CM

## 2021-11-04 ENCOUNTER — Other Ambulatory Visit: Payer: Self-pay

## 2021-11-04 ENCOUNTER — Encounter (INDEPENDENT_AMBULATORY_CARE_PROVIDER_SITE_OTHER): Payer: Self-pay | Admitting: Primary Care

## 2021-11-04 ENCOUNTER — Ambulatory Visit (INDEPENDENT_AMBULATORY_CARE_PROVIDER_SITE_OTHER): Payer: Medicaid Other | Admitting: Primary Care

## 2021-11-04 VITALS — BP 135/88 | HR 52 | Temp 97.4°F | Ht 74.0 in | Wt 175.6 lb

## 2021-11-04 DIAGNOSIS — Z23 Encounter for immunization: Secondary | ICD-10-CM | POA: Diagnosis not present

## 2021-11-04 DIAGNOSIS — H6122 Impacted cerumen, left ear: Secondary | ICD-10-CM

## 2021-11-04 NOTE — Progress Notes (Signed)
. ? ? [facility] ? ? ? ? ? ? ?Subjective:  ? ? Logan Bailey is a 61 y.o. male whom I am asked to see for evaluation of diminished hearing in the left ear for the past 3 weeks. There is a prior history of cerumen impaction. The patient has not been using ear drops to loosen wax immediately prior to this visit. The patient denies ear pain. ? ?The patient's history has been marked as reviewed and updated as appropriate. ?allergies, current medications, past family history, past medical history, past social history, and past surgical history ?Past Medical History:  ?Diagnosis Date  ? Depression   ? GERD (gastroesophageal reflux disease)   ? Seasonal allergies   ? ?Patient Active Problem List  ? Diagnosis Date Noted  ? Poor dentition 06/23/2018  ? Tobacco use disorder 06/23/2018  ? Depression with anxiety 06/23/2018  ? Chronic bilateral low back pain with bilateral sciatica 06/23/2018  ? Porokeratosis 01/13/2018  ? ?Past Surgical History:  ?Procedure Laterality Date  ? HERNIA REPAIR  10/16/2012  ? incarcerated  ? INGUINAL HERNIA REPAIR Left 10/16/2012  ? Procedure: HERNIA REPAIR INGUINAL ADULT;  Surgeon: Axel Filler, MD;  Location: [facility] OR;  Service: General;  Laterality: Left;  ? ?No family history on file. ?Social History  ? ?Socioeconomic History  ? Marital status: Divorced  ?  Spouse name: Not on file  ? Number of children: Not on file  ? Years of education: Not on file  ? Highest education level: Not on file  ?Occupational History  ? Not on file  ?Tobacco Use  ? Smoking status: Former  ?  Packs/day: 1.00  ?  Years: 30.00  ?  Pack years: 30.00  ?  Types: Cigarettes  ?  Quit date: 02/20/2018  ?  Years since quitting: 3.7  ? Smokeless tobacco: Never  ? Tobacco comments:  ?  no cigarette in 14 days   ?Vaping Use  ? Vaping Use: Never used  ?Substance and Sexual Activity  ? Alcohol use: No  ? Drug use: Yes  ?  Types: Marijuana  ? Sexual activity: Not on file  ?Other Topics Concern  ? Not on file   ?Social History Narrative  ? Not on file  ? ?Social Determinants of Health  ? ?Financial Resource Strain: Not on file  ?Food Insecurity: Not on file  ?Transportation Needs: Not on file  ?Physical Activity: Not on file  ?Stress: Not on file  ?Social Connections: Not on file  ? ?Current Outpatient Medications  ?Medication Sig Dispense Refill  ? EPINEPHrine 0.3 mg/0.3 mL IJ SOAJ injection Inject 0.3 mLs (0.3 mg total) into the muscle as needed for anaphylaxis. 1 each 0  ? gabapentin (NEURONTIN) 300 MG capsule take 1 capsule (300 mg total) by mouth three times daily 90 capsule 2  ? lumateperone tosylate (CAPLYTA) 42 MG capsule Take 1 capsule (42 mg total) by mouth daily. 30 capsule 2  ? vitamin B-12 (CYANOCOBALAMIN) 100 MCG tablet Take 200 mcg by mouth daily.     ? DULoxetine (CYMBALTA) 20 MG capsule Take 1 capsule (20 mg total) by mouth daily for 14 days. 14 capsule 0  ? OLANZapine (ZYPREXA) 5 MG tablet Take 1 tablet (5 mg total) by mouth at bedtime for 14 days. 14 tablet 0  ? omeprazole (PRILOSEC) 20 MG capsule TAKE 1 CAPSULE (20 MG TOTAL) BY MOUTH DAILY. 30 capsule 3  ? ?No current facility-administered medications for this visit.  ? ?Allergies: Bee  venom ? ?Review of Systems ?Pertinent items noted in HPI and remainder of comprehensive ROS otherwise negative.  ?  ?Objective:  ? ? Auditory canal(s) of the left ear are completely obstructed with cerumen.  ? ?Cerumen was removed using gentle irrigation. Tympanic membranes are intact following the procedure.  Auditory canals are inflamed.  ?  ?Assessment:  ?Jarrette was seen today for blood pressure check. ? ?Diagnoses and all orders for this visit: ? ?Need for shingles vaccine ?Done  ?-     Varicella-zoster vaccine IM (Shingrix) ? ?Impacted cerumen of left ear ? Cerumen Impaction without otitis externa.  ? 1. Care instructions given. ?2. Home treatment: none. ?3. Follow-up as needed.  ? ?This note has been created with Personnel officer. Any transcriptional errors are unintentional.  ? ?Grayce Sessions, NP ?11/04/2021, 1:39 PM  ?

## 2021-11-11 ENCOUNTER — Other Ambulatory Visit: Payer: Self-pay

## 2021-12-07 ENCOUNTER — Ambulatory Visit (INDEPENDENT_AMBULATORY_CARE_PROVIDER_SITE_OTHER): Payer: Medicaid Other | Admitting: Podiatry

## 2021-12-07 ENCOUNTER — Encounter: Payer: Self-pay | Admitting: Podiatry

## 2021-12-07 DIAGNOSIS — M79672 Pain in left foot: Secondary | ICD-10-CM

## 2021-12-07 DIAGNOSIS — M79675 Pain in left toe(s): Secondary | ICD-10-CM

## 2021-12-07 DIAGNOSIS — B351 Tinea unguium: Secondary | ICD-10-CM | POA: Diagnosis not present

## 2021-12-07 DIAGNOSIS — Q828 Other specified congenital malformations of skin: Secondary | ICD-10-CM

## 2021-12-07 DIAGNOSIS — M79674 Pain in right toe(s): Secondary | ICD-10-CM | POA: Diagnosis not present

## 2021-12-07 DIAGNOSIS — M79671 Pain in right foot: Secondary | ICD-10-CM | POA: Diagnosis not present

## 2021-12-15 NOTE — Progress Notes (Signed)
?  Subjective:  ?Patient ID: Logan Bailey, male    DOB: Sep 10, 1960,  MRN: [id number] ? ?Logan Bailey presents to clinic today for painful porokeratotic lesion(s) b/l lower extremities and painful mycotic toenails that limit ambulation. Painful toenails interfere with ambulation. Aggravating factors include wearing enclosed shoe gear. Pain is relieved with periodic professional debridement. Painful porokeratotic lesions are aggravated when weightbearing with and without shoegear. Pain is relieved with periodic professional debridement. ? ?New problem(s): None.  ? ?PCP is Logan Sessions, NP , and last visit was November 04, 2021. ? ?Allergies  ?Allergen Reactions  ? Bee Venom Anaphylaxis and Hives  ? ? ?Review of Systems: Negative except as noted in the HPI. ? ?Objective: No changes noted in today's physical examination. ? ?General: Patient is a pleasant 61 y.o. African American male WD, WN in NAD. AAO x 3.  ? ?Neurovascular Examination: ?CFT immediate b/l LE. Palpable DP/PT pulses b/l LE. Digital hair absent b/l. Skin temperature gradient WNL b/l. No pain with calf compression b/l. No edema noted b/l. No cyanosis or clubbing noted b/l LE. ? ?Protective sensation intact 5/5 intact bilaterally with 10g monofilament b/l. Vibratory sensation intact b/l. ? ?Dermatological:  ?Pedal integument with normal turgor, texture and tone b/l LE. No open wounds b/l. No interdigital macerations b/l. Toenails 1-5 b/l elongated, thickened, discolored with subungual debris. +Tenderness with dorsal palpation of nailplates. Porokeratotic lesion(s) noted submet head 1 left foot and submet head 5 b/l. ? ?Musculoskeletal:  ?Normal muscle strength 5/5 to all lower extremity muscle groups bilaterally. Hallux valgus with bunion deformity noted b/l lower extremities.. No pain, crepitus or joint limitation noted with ROM b/l LE.  Patient ambulates independently without assistive aids. ? ?Assessment/Plan: ?1. Pain due to onychomycosis of  toenails of both feet   ?2. Porokeratosis   ?3. Pain in both feet   ?  ? ?-Patient was evaluated and treated. All patient's and/or POA's questions/concerns answered on today's visit. ?-Medicaid ABN signed for this year. Patient consents for services of paring of calluses today. Copy has been placed in patient chart. ?-Toenails 1-5 b/l were debrided in length and girth with sterile nail nippers and dremel without iatrogenic bleeding.  ?-Porokeratotic lesion(s) submet head 1 left foot and submet head 5 b/l pared and enucleated with sterile scalpel blade without incident. Total number of lesions debrided=3. ?-Patient/POA to call should there be question/concern in the interim.  ? ?Return in about 3 months (around 03/09/2022). ? ?Freddie Breech, DPM  ?

## 2021-12-17 ENCOUNTER — Encounter: Payer: Self-pay | Admitting: Physician Assistant

## 2022-01-06 ENCOUNTER — Ambulatory Visit: Payer: Medicaid Other | Admitting: Physician Assistant

## 2022-01-06 ENCOUNTER — Encounter: Payer: Self-pay | Admitting: Physician Assistant

## 2022-01-06 VITALS — BP 132/80 | HR 54 | Ht 74.0 in | Wt 177.2 lb

## 2022-01-06 DIAGNOSIS — K5900 Constipation, unspecified: Secondary | ICD-10-CM

## 2022-01-06 DIAGNOSIS — R1013 Epigastric pain: Secondary | ICD-10-CM | POA: Diagnosis not present

## 2022-01-06 MED ORDER — OMEPRAZOLE 40 MG PO CPDR: 40.0000 mg | DELAYED_RELEASE_CAPSULE | Freq: Every day | ORAL | 5 refills | Status: AC

## 2022-01-06 MED ORDER — POLYETHYLENE GLYCOL 3350 17 GM/SCOOP PO POWD: 17.0000 g | Freq: Every day | ORAL | 3 refills | Status: DC

## 2022-01-06 NOTE — Patient Instructions (Addendum)
We have sent the following medications to your pharmacy for you to pick up at your convenience: Omeprazole 40 mg daily 30-60 minutes before breakfast.  Start Miralax 1 capful daily in 8 ounces of liquid.  If you are age 61 or older, your body mass index should be between 23-30. Your Body mass index is 22.75 kg/m. If this is out of the aforementioned range listed, please consider follow up with your Primary Care Provider.  If you are age 71 or younger, your body mass index should be between 19-25. Your Body mass index is 22.75 kg/m. If this is out of the aformentioned range listed, please consider follow up with your Primary Care Provider.   ________________________________________________________  The [facility] providers would like to encourage you to use [facility] to communicate with providers for non-urgent requests or questions.  Due to long hold times on the telephone, sending your provider a message by [facility] may be a faster and more efficient way to get a response.  Please allow 48 business hours for a response.  Please remember that this is for non-urgent requests.  _______________________________________________________

## 2022-01-06 NOTE — Progress Notes (Unsigned)
Chief Complaint: Abdominal pain  HPI:    Logan Bailey is a 61 year old African-American male with a past medical history as listed below, who presents to clinic today as a referral from NP Gwinda Passe for abdominal pain.    10/07/2021 CBC normal, CMP with a minimally elevated AST at 61.    Today, the patient presents to clinic accompanied by a nurse from his assisted living facility, he is a poor historian and is hard to garner exactly what is been going on but he tells me that over the past year he occasionally feels a pressure or hardness in his epigastric area, maybe 3 days out of the week.  He will typically lay down and will eventually get better.  He does not feel this is related to eating certain foods or when he eats.  It can just come on "randomly".      Also discusses that he is having trouble with bowel movements noting that sometimes it is hard for them to come out when they do they are small balls.    Tells me that he recently had screening for colon cancer via some "chemistry test".    Denies fever, chills, weight loss or blood in his stool.  Past Medical History:  Diagnosis Date   Depression    GERD (gastroesophageal reflux disease)    Seasonal allergies     Past Surgical History:  Procedure Laterality Date   HERNIA REPAIR  10/16/2012   incarcerated   INGUINAL HERNIA REPAIR Left 10/16/2012   Procedure: HERNIA REPAIR INGUINAL ADULT;  Surgeon: Axel Filler, MD;  Location: [facility] OR;  Service: General;  Laterality: Left;    Current Outpatient Medications  Medication Sig Dispense Refill   EPINEPHrine 0.3 mg/0.3 mL IJ SOAJ injection Inject 0.3 mLs (0.3 mg total) into the muscle as needed for anaphylaxis. 1 each 0   gabapentin (NEURONTIN) 300 MG capsule take 1 capsule (300 mg total) by mouth three times daily 90 capsule 2   omeprazole (PRILOSEC) 20 MG capsule TAKE 1 CAPSULE (20 MG TOTAL) BY MOUTH DAILY. 30 capsule 3   No current facility-administered medications for this  visit.    Allergies as of 01/06/2022 - Review Complete 01/06/2022  Allergen Reaction Noted   Bee venom Anaphylaxis and Hives 03/03/2020    Family History  Problem Relation Age of Onset   Colon cancer Neg Hx    Stomach cancer Neg Hx    Esophageal cancer Neg Hx    Colon polyps Neg Hx     Social History   Socioeconomic History   Marital status: Divorced    Spouse name: Not on file   Number of children: Not on file   Years of education: Not on file   Highest education level: Not on file  Occupational History   Not on file  Tobacco Use   Smoking status: Former    Packs/day: 1.00    Years: 30.00    Pack years: 30.00    Types: Cigarettes    Quit date: 02/20/2018    Years since quitting: 3.8   Smokeless tobacco: Never   Tobacco comments:    no cigarette in 14 days   Vaping Use   Vaping Use: Never used  Substance and Sexual Activity   Alcohol use: No   Drug use: Yes    Types: Marijuana   Sexual activity: Not on file  Other Topics Concern   Not on file  Social History Narrative   Not on file  Social Determinants of Health   Financial Resource Strain: Not on file  Food Insecurity: Not on file  Transportation Needs: Not on file  Physical Activity: Not on file  Stress: Not on file  Social Connections: Not on file  Intimate Partner Violence: Not on file    Review of Systems:    Constitutional: No weight loss, fever or chills Skin: No rash  Cardiovascular: No chest pain Respiratory: No SOB  Gastrointestinal: See HPI and otherwise negative Genitourinary: No dysuria  Neurological: No headache, dizziness or syncope Musculoskeletal: No new muscle or joint pain Hematologic: No bleeding Psychiatric: No history of depression or anxiety   Physical Exam:  Vital signs: BP 132/80   Pulse (!) 54   Ht 6\' 2"  (1.88 m)   Wt 177 lb 3.2 oz (80.4 kg)   SpO2 98%   BMI 22.75 kg/m    Constitutional:   Pleasant AA male appears to be in NAD, Well developed, Well nourished,  alert and cooperative Head:  Normocephalic and atraumatic. Eyes:   PEERL, EOMI. No icterus. Conjunctiva pink. Ears:  Normal auditory acuity. Neck:  Supple Throat: Oral cavity and pharynx without inflammation, swelling or lesion.  Respiratory: Respirations even and unlabored. Lungs clear to auscultation bilaterally.   No wheezes, crackles, or rhonchi.  Cardiovascular: Normal S1, S2. No MRG. Regular rate and rhythm. No peripheral edema, cyanosis or pallor.  Gastrointestinal:  Soft, nondistended, nontender. No rebound or guarding. Normal bowel sounds. No appreciable masses or hepatomegaly. Rectal:  Not performed.  Msk:  Symmetrical without gross deformities. Without edema, no deformity or joint abnormality. +ambulates with cane Neurologic:  Alert and  oriented x4;  grossly normal neurologically.  Skin:   Dry and intact without significant lesions or rashes. Psychiatric: Demonstrates good judgement and reason without abnormal affect or behaviors.  RELEVANT LABS AND IMAGING: CBC    Component Value Date/Time   WBC 4.9 10/07/2021 1110   WBC 6.1 02/26/2021 1957   RBC 4.79 10/07/2021 1110   RBC 4.69 02/26/2021 1957   HGB 14.0 10/07/2021 1110   HCT 42.0 10/07/2021 1110   PLT 331 10/07/2021 1110   MCV 88 10/07/2021 1110   MCH 29.2 10/07/2021 1110   MCH 29.0 02/26/2021 1957   MCHC 33.3 10/07/2021 1110   MCHC 31.6 02/26/2021 1957   RDW 13.3 10/07/2021 1110   LYMPHSABS 1.9 10/07/2021 1110   MONOABS 0.6 02/26/2021 1957   EOSABS 0.1 10/07/2021 1110   BASOSABS 0.0 10/07/2021 1110    CMP     Component Value Date/Time   NA 144 10/07/2021 1110   K 3.9 10/07/2021 1110   CL 106 10/07/2021 1110   CO2 23 10/07/2021 1110   GLUCOSE 84 10/07/2021 1110   GLUCOSE 76 02/26/2021 1957   BUN 10 10/07/2021 1110   CREATININE 0.91 10/07/2021 1110   CALCIUM 9.3 10/07/2021 1110   PROT 7.0 10/07/2021 1110   ALBUMIN 4.5 10/07/2021 1110   AST 61 (H) 10/07/2021 1110   ALT 27 10/07/2021 1110   ALKPHOS 73  10/07/2021 1110   BILITOT 0.4 10/07/2021 1110   GFRNONAA >60 02/26/2021 1957   GFRAA 110 03/31/2020 1438    Assessment: 1.  Epigastric pain: More of a "hardness" in his epigastric area about 3 days out of the week, he thinks unassociated with eating, seems to come and go, better after laying down; consider gastritis +/- bloating 2.  Constipation: Sometimes hard for him to get stool out  Plan: 1.  We are requesting recent "colon  cancer testing" from his PCP. 2.  Started the patient on Omeprazole 40 mg every morning, 30-60 minutes before breakfast, #30 with 5 refills 3.  Also recommend the patient's start a fiber supplement in the morning along with MiraLAX once daily to keep regular bowel movements. 4.  Patient will follow in clinic in 2 months to see how everything above has helped him.  He was assigned to Dr. Christella Hartigan this morning.  Hyacinth Meeker, PA-C [facility] 01/06/2022, 11:33 AM  Cc: Grayce Sessions, NP

## 2022-01-07 ENCOUNTER — Encounter: Payer: Self-pay | Admitting: Physician Assistant

## 2022-01-07 NOTE — Progress Notes (Signed)
I agree with the abovfe note, plan

## 2022-02-04 ENCOUNTER — Other Ambulatory Visit (HOSPITAL_COMMUNITY): Payer: Self-pay

## 2022-02-04 ENCOUNTER — Other Ambulatory Visit: Payer: Self-pay

## 2022-02-04 ENCOUNTER — Encounter (INDEPENDENT_AMBULATORY_CARE_PROVIDER_SITE_OTHER): Payer: Self-pay | Admitting: Primary Care

## 2022-02-04 ENCOUNTER — Ambulatory Visit (INDEPENDENT_AMBULATORY_CARE_PROVIDER_SITE_OTHER): Payer: Medicaid Other | Admitting: Primary Care

## 2022-02-04 VITALS — BP 122/78 | HR 57 | Temp 97.5°F | Ht 74.0 in | Wt 174.0 lb

## 2022-02-04 DIAGNOSIS — Z1211 Encounter for screening for malignant neoplasm of colon: Secondary | ICD-10-CM

## 2022-02-04 DIAGNOSIS — Z0130 Encounter for examination of blood pressure without abnormal findings: Secondary | ICD-10-CM | POA: Diagnosis not present

## 2022-02-04 NOTE — Progress Notes (Signed)
[facility]  Logan Bailey, is a 61 y.o. male  QMV:[id number]  MWU:[id number]  DOB - 11-01-1960  Chief Complaint  Patient presents with   Hypertension    Pt presents with his nurse aid Logan Bailey       Subjective:   Logan Bailey is a 61 y.o. male here today for a follow up visit for elevated Bp.He is not on any medication and BP is un remarkable.  Asked patient what was a difference he responded not arguing and fighting.  He has a new living environment that is stress-free and enjoying it.  Blood pressure was increased due to situational stress.  Patient has No headache, No chest pain, No abdominal pain - No Nausea, No new weakness tingling or numbness, No Cough - shortness of breath  No problems updated.  Allergies  Allergen Reactions   Bee Venom Anaphylaxis and Hives    Past Medical History:  Diagnosis Date   Depression    GERD (gastroesophageal reflux disease)    Seasonal allergies     Current Outpatient Medications on File Prior to Visit  Medication Sig Dispense Refill   EPINEPHrine 0.3 mg/0.3 mL IJ SOAJ injection Inject 0.3 mLs (0.3 mg total) into the muscle as needed for anaphylaxis. 1 each 0   gabapentin (NEURONTIN) 300 MG capsule take 1 capsule (300 mg total) by mouth three times daily 90 capsule 2   omeprazole (PRILOSEC) 40 MG capsule Take 1 capsule (40 mg total) by mouth daily. 30 capsule 5   PERSERIS 90 MG PRSY SMARTSIG:1 Milligram(s) IM Every 4 Weeks     polyethylene glycol powder (GLYCOLAX/MIRALAX) 17 GM/SCOOP powder Take 17 g by mouth daily. 510 g 3   [DISCONTINUED] buPROPion (WELLBUTRIN XL) 150 MG 24 hr tablet Take 1 tablet (150 mg total) by mouth daily. For smoking cessation 90 tablet 1   [DISCONTINUED] famotidine (PEPCID) 20 MG tablet Take 1 tablet (20 mg total) by mouth 2 (two) times daily. (Patient not taking: No sig reported) 30 tablet 0   No current facility-administered medications on file prior to visit.    Objective:   Vitals:    02/04/22 0917  BP: 122/78  Pulse: (!) 57  Temp: (!) 97.5 F (36.4 C)  TempSrc: Oral  SpO2: 96%  Weight: 174 lb (78.9 kg)  Height: 6\' 2"  (1.88 m)   Comprehensive ROS Pertinent positive and negative noted in HPI   Exam General appearance : Awake, alert, not in any distress. Speech Clear. Not toxic looking HEENT: Atraumatic and Normocephalic, pupils equally reactive to light and accomodation Neck: Supple, no JVD. No cervical lymphadenopathy.  Chest: Good air entry bilaterally, no added sounds  CVS: S1 S2 regular, no murmurs.  Abdomen: Bowel sounds present, Non tender and not distended with no gaurding, rigidity or rebound. Extremities: B/L Lower Ext shows no edema, both legs are warm to touch Neurology: Awake alert, and oriented X 3, Non focal Skin: No Rash  Data Review No results found for: "HGBA1C"  Assessment & Plan  Logan Bailey was seen today for hypertension.  Diagnoses and all orders for this visit:  Colon cancer screening Colo guard   Blood pressure check Controlled without medication see HPI     Patient have been counseled extensively about nutrition and exercise. Other issues discussed during this visit include: low cholesterol diet, weight control and daily exercise, foot care, annual eye examinations at Ophthalmology, importance of adherence with medications and regular follow-up. We also discussed long term complications of uncontrolled diabetes and hypertension.  No follow-ups on file.  The patient was given clear instructions to go to ER or return to medical center if symptoms don't improve, worsen or new problems develop. The patient verbalized understanding. The patient was told to call to get lab results if they haven't heard anything in the next week.   This note has been created with Education officer, environmental. Any transcriptional errors are unintentional.   Grayce Sessions, NP 02/04/2022, 9:49 AM

## 2022-02-10 ENCOUNTER — Telehealth: Payer: Self-pay | Admitting: Podiatry

## 2022-02-10 NOTE — Telephone Encounter (Signed)
Pt left message yesterday wanting to check on his appt.  I returned call and left message for pt to call me back.

## 2022-02-25 LAB — COLOGUARD: COLOGUARD: NEGATIVE

## 2022-03-04 ENCOUNTER — Telehealth (INDEPENDENT_AMBULATORY_CARE_PROVIDER_SITE_OTHER): Payer: Self-pay

## 2022-03-04 NOTE — Telephone Encounter (Signed)
-----   Message from Grayce Sessions, NP sent at 02/26/2022  8:57 AM EDT ----- COLOGUARD - negative repeat in 3 years

## 2022-03-04 NOTE — Telephone Encounter (Signed)
Patient aware of negative cologuard, repeat in 3 years. Logan Bailey, CMA

## 2022-03-08 ENCOUNTER — Ambulatory Visit: Payer: Medicaid Other | Admitting: Podiatry

## 2022-03-26 ENCOUNTER — Other Ambulatory Visit: Payer: Self-pay

## 2022-03-26 MED ORDER — GABAPENTIN 300 MG PO CAPS
300.0000 mg | ORAL_CAPSULE | Freq: Three times a day (TID) | ORAL | 2 refills | Status: DC
  Filled 2022-03-26: qty 90, 30d supply, fill #0
  Filled 2022-04-26: qty 90, 30d supply, fill #1
  Filled 2022-05-31: qty 90, 30d supply, fill #2

## 2022-03-29 ENCOUNTER — Other Ambulatory Visit: Payer: Self-pay

## 2022-03-30 ENCOUNTER — Other Ambulatory Visit: Payer: Self-pay

## 2022-04-16 ENCOUNTER — Other Ambulatory Visit: Payer: Self-pay | Admitting: Physician Assistant

## 2022-04-26 ENCOUNTER — Other Ambulatory Visit: Payer: Self-pay

## 2022-05-31 ENCOUNTER — Other Ambulatory Visit: Payer: Self-pay

## 2022-06-04 ENCOUNTER — Other Ambulatory Visit: Payer: Self-pay

## 2022-06-04 MED ORDER — PERSERIS 90 MG ~~LOC~~ PRSY
1.0000 mg | PREFILLED_SYRINGE | SUBCUTANEOUS | 2 refills | Status: AC
  Filled 2022-06-04: qty 1, 30d supply, fill #0

## 2022-06-04 MED ORDER — GABAPENTIN 300 MG PO CAPS
300.0000 mg | ORAL_CAPSULE | Freq: Three times a day (TID) | ORAL | 2 refills | Status: DC
  Filled 2022-06-04 – 2022-07-05 (×2): qty 90, 30d supply, fill #0
  Filled 2022-07-29: qty 90, 30d supply, fill #1

## 2022-06-04 MED ORDER — CAPLYTA 42 MG PO CAPS
42.0000 mg | ORAL_CAPSULE | Freq: Every day | ORAL | 2 refills | Status: AC
  Filled 2022-06-04: qty 30, 30d supply, fill #0

## 2022-06-07 ENCOUNTER — Other Ambulatory Visit: Payer: Self-pay

## 2022-06-11 ENCOUNTER — Other Ambulatory Visit: Payer: Self-pay

## 2022-06-15 ENCOUNTER — Ambulatory Visit (INDEPENDENT_AMBULATORY_CARE_PROVIDER_SITE_OTHER): Payer: Medicaid Other | Admitting: Podiatry

## 2022-06-15 ENCOUNTER — Encounter: Payer: Self-pay | Admitting: Podiatry

## 2022-06-15 DIAGNOSIS — M79672 Pain in left foot: Secondary | ICD-10-CM

## 2022-06-15 DIAGNOSIS — B351 Tinea unguium: Secondary | ICD-10-CM | POA: Diagnosis not present

## 2022-06-15 DIAGNOSIS — M79674 Pain in right toe(s): Secondary | ICD-10-CM

## 2022-06-15 DIAGNOSIS — M79671 Pain in right foot: Secondary | ICD-10-CM | POA: Diagnosis not present

## 2022-06-15 DIAGNOSIS — M79675 Pain in left toe(s): Secondary | ICD-10-CM | POA: Diagnosis not present

## 2022-06-15 DIAGNOSIS — Q828 Other specified congenital malformations of skin: Secondary | ICD-10-CM | POA: Diagnosis not present

## 2022-06-21 NOTE — Progress Notes (Signed)
  Subjective:  Patient ID: Logan Bailey, male    DOB: 05-08-1961,  MRN: [id number]  Logan Bailey presents to clinic today for painful porokeratotic lesion(s) b/l lower extremities and painful mycotic toenails that limit ambulation. Painful toenails interfere with ambulation. Aggravating factors include wearing enclosed shoe gear. Pain is relieved with periodic professional debridement. Painful porokeratotic lesions are aggravated when weightbearing with and without shoegear. Pain is relieved with periodic professional debridement.  Chief Complaint  Patient presents with   Nail Problem    RFC Bilateral nail trim 1-5.   New problem(s): None.   PCP is Grayce Sessions, NP , and last visit was February 04, 2022.  Allergies  Allergen Reactions   Bee Venom Anaphylaxis and Hives    Review of Systems: Negative except as noted in the HPI.  Objective: No changes noted in today's physical examination.  Logan Bailey is a pleasant 60 y.o. male WD, WN in NAD. AAO x 3.  Assessment/Plan: 1. Pain due to onychomycosis of toenails of both feet   2. Porokeratosis   3. Pain in both feet     No orders of the defined types were placed in this encounter.   -Consent given for treatment as described below: -Examined patient. -Medicaid ABN on file for paring of corn(s)/callus(es)/porokeratos(es). Copy in patient chart. -Mycotic toenails 1-5 bilaterally were debrided in length and girth with sterile nail nippers and dremel without incident. -Porokeratotic lesion(s) submet head 1 left foot and submet head 5 b/l pared and enucleated with sterile currette without incident. Total number of lesions debrided=3. -Patient/POA to call should there be question/concern in the interim.   Return in about 3 months (around 09/15/2022).  Freddie Breech, DPM

## 2022-06-23 ENCOUNTER — Other Ambulatory Visit: Payer: Self-pay

## 2022-06-28 ENCOUNTER — Other Ambulatory Visit (INDEPENDENT_AMBULATORY_CARE_PROVIDER_SITE_OTHER): Payer: Self-pay | Admitting: Primary Care

## 2022-06-28 NOTE — Telephone Encounter (Signed)
Medication Refill - Medication: gabapentin (NEURONTIN) 300 MG capsule   Has the patient contacted their pharmacy? No. Nurse calling . (Agent: If no, request that the patient contact the pharmacy for the refill. If patient does not wish to contact the pharmacy document the reason why and proceed with request.)   Preferred Pharmacy (with phone number or street name):  [facility]  [address], [address] [place] [address]  Phone: [phone] Fax: [phone]  Hours: M-F 7:30a-6:00p   Has the patient been seen for an appointment in the last year OR does the patient have an upcoming appointment? Yes.    Agent: Please be advised that RX refills may take up to 3 business days. We ask that you follow-up with your pharmacy.

## 2022-06-29 NOTE — Telephone Encounter (Signed)
Requested medication (s) are due for refill today - no  Requested medication (s) are on the active medication list -yes  Future visit scheduled -yes  Last refill: 06/03/22 #90 2RF  Notes to clinic: outside provider  Requested Prescriptions  Pending Prescriptions Disp Refills   gabapentin (NEURONTIN) 300 MG capsule 90 capsule 2    Sig: Take 1 capsule (300 mg total) by mouth 3 (three) times daily as directed.     Neurology: Anticonvulsants - gabapentin Passed - 06/28/2022  2:53 PM      Passed - Cr in normal range and within 360 days    Creatinine, Ser  Date Value Ref Range Status  10/07/2021 0.91 0.76 - 1.27 mg/dL Final         Passed - Completed PHQ-2 or PHQ-9 in the last 360 days      Passed - Valid encounter within last 12 months    Recent Outpatient Visits           4 months ago Colon cancer screening   [facility] Grayce Sessions, NP   7 months ago Need for zoster vaccination   [facility] Grayce Sessions, NP   8 months ago Essential hypertension   [facility] Grayce Sessions, NP   11 months ago Need for zoster vaccination   [facility] Grayce Sessions, NP   1 year ago Hospital discharge follow-up   [facility] Grayce Sessions, NP       Future Appointments             In 1 month Edwards, Kinnie Scales, NP [facility]               Requested Prescriptions  Pending Prescriptions Disp Refills   gabapentin (NEURONTIN) 300 MG capsule 90 capsule 2    Sig: Take 1 capsule (300 mg total) by mouth 3 (three) times daily as directed.     Neurology: Anticonvulsants - gabapentin Passed - 06/28/2022  2:53 PM      Passed - Cr in normal range and within 360 days    Creatinine, Ser  Date Value Ref Range Status  10/07/2021 0.91 0.76 - 1.27 mg/dL Final         Passed - Completed PHQ-2 or PHQ-9 in the last 360 days       Passed - Valid encounter within last 12 months    Recent Outpatient Visits           4 months ago Colon cancer screening   [facility] Grayce Sessions, NP   7 months ago Need for zoster vaccination   [facility] Grayce Sessions, NP   8 months ago Essential hypertension   [facility] Grayce Sessions, NP   11 months ago Need for zoster vaccination   [facility] Grayce Sessions, NP   1 year ago Hospital discharge follow-up   [facility] Grayce Sessions, NP       Future Appointments             In 1 month Randa Evens, Kinnie Scales, NP [facility]

## 2022-06-29 NOTE — Telephone Encounter (Signed)
Will forward to provider  

## 2022-07-05 ENCOUNTER — Other Ambulatory Visit: Payer: Self-pay

## 2022-07-29 ENCOUNTER — Other Ambulatory Visit: Payer: Self-pay

## 2022-08-06 ENCOUNTER — Ambulatory Visit (INDEPENDENT_AMBULATORY_CARE_PROVIDER_SITE_OTHER): Payer: Medicaid Other | Admitting: Primary Care

## 2022-08-06 ENCOUNTER — Encounter (INDEPENDENT_AMBULATORY_CARE_PROVIDER_SITE_OTHER): Payer: Self-pay | Admitting: Primary Care

## 2022-08-06 VITALS — BP 119/81 | HR 60 | Wt 188.4 lb

## 2022-08-06 DIAGNOSIS — F17210 Nicotine dependence, cigarettes, uncomplicated: Secondary | ICD-10-CM

## 2022-08-06 DIAGNOSIS — Z23 Encounter for immunization: Secondary | ICD-10-CM

## 2022-08-06 DIAGNOSIS — Z79899 Other long term (current) drug therapy: Secondary | ICD-10-CM

## 2022-08-06 DIAGNOSIS — F17200 Nicotine dependence, unspecified, uncomplicated: Secondary | ICD-10-CM

## 2022-08-06 DIAGNOSIS — R252 Cramp and spasm: Secondary | ICD-10-CM | POA: Diagnosis not present

## 2022-08-06 NOTE — Patient Instructions (Signed)
Muscle Cramps and Spasms Muscle cramps and spasms occur when a muscle or muscles tighten and you have no control over this tightening (involuntary muscle contraction). They are a common problem and can develop in any muscle. The most common place is in the calf muscles of the leg. Muscle cramps and muscle spasms are both involuntary muscle contractions, but there are some differences between the two: Muscle cramps are painful. They come and go and may last for a few seconds or up to 15 minutes. Muscle cramps are often more forceful and last longer than muscle spasms. Muscle spasms may or may not be painful. They may also last just a few seconds or much longer. Certain medical conditions, such as diabetes or Parkinson's disease, can make it more likely to develop cramps or spasms. However, cramps or spasms are usually not caused by a serious underlying problem. Common causes include: Doing more physical work or exercise than your body is ready for (overexertion). Overuse from repeating certain movements too many times. Remaining in a certain position for a long period of time. Improper preparation, form, or technique while playing a sport or doing an activity. Dehydration. Injury. Side effects of some medicines. Abnormally low levels of the salts and minerals in your blood (electrolytes), especially potassium and calcium. This could happen if you are taking water pills (diuretics) or if you are pregnant. In many cases, the cause of muscle cramps or spasms is not known. Follow these instructions at home: Managing pain and stiffness     Try massaging, stretching, and relaxing the affected muscle. Do this for several minutes at a time. If directed, apply heat to tight or tense muscles as often as told by your health care provider. Use the heat source that your health care provider recommends, such as a moist heat pack or a heating pad. Place a towel between your skin and the heat source. Leave the  heat on for 20-30 minutes. Remove the heat if your skin turns bright red. This is especially important if you are unable to feel pain, heat, or cold. You may have a greater risk of getting burned. If directed, put ice on the affected area. This may help if you are sore or have pain after a cramp or spasm. Put ice in a plastic bag. Place a towel between your skin and the bag. Leave the ice on for 20 minutes, 2-3 times a day. Try taking hot showers or baths to help relax tight muscles. Eating and drinking Drink enough fluid to keep your urine pale yellow. Staying well hydrated may help prevent cramps or spasms. Eat a healthy diet that includes plenty of nutrients to help your muscles function. A healthy diet includes fruits and vegetables, lean protein, whole grains, and low-fat or nonfat dairy products. General instructions If you are having frequent cramps, avoid intense exercise for several days. Take over-the-counter and prescription medicines only as told by your health care provider. Pay attention to any changes in your symptoms. Keep all follow-up visits as told by your health care provider. This is important. Contact a health care provider if: Your cramps or spasms get more severe or happen more often. Your cramps or spasms do not improve over time. Summary Muscle cramps and spasms occur when a muscle or muscles tighten and you have no control over this tightening (involuntary muscle contraction). The most common place for cramps or spasms to occur is in the calf muscles of the leg. Massaging, stretching, and relaxing the affected   muscle may relieve the cramp or spasm. Drink enough fluid to keep your urine pale yellow. Staying well hydrated may help prevent cramps or spasms. This information is not intended to replace advice given to you by your health care provider. Make sure you discuss any questions you have with your health care provider. Document Revised: 02/13/2021 Document  Reviewed: 02/13/2021 Elsevier Patient Education  2023 [employer] Inc.  

## 2022-08-06 NOTE — Progress Notes (Signed)
Logan Bailey (Patent attorney)

## 2022-08-07 LAB — CBC WITH DIFFERENTIAL/PLATELET
Basophils Absolute: 0 10*3/uL (ref 0.0–0.2)
Basos: 0 %
EOS (ABSOLUTE): 0.1 10*3/uL (ref 0.0–0.4)
Eos: 1 %
Hematocrit: 41.8 % (ref 37.5–51.0)
Hemoglobin: 14 g/dL (ref 13.0–17.7)
Immature Grans (Abs): 0 10*3/uL (ref 0.0–0.1)
Immature Granulocytes: 0 %
Lymphocytes Absolute: 1.8 10*3/uL (ref 0.7–3.1)
Lymphs: 37 %
MCH: 29.3 pg (ref 26.6–33.0)
MCHC: 33.5 g/dL (ref 31.5–35.7)
MCV: 87 fL (ref 79–97)
Monocytes Absolute: 0.4 10*3/uL (ref 0.1–0.9)
Monocytes: 9 %
Neutrophils Absolute: 2.7 10*3/uL (ref 1.4–7.0)
Neutrophils: 53 %
Platelets: 344 10*3/uL (ref 150–450)
RBC: 4.78 x10E6/uL (ref 4.14–5.80)
RDW: 12.6 % (ref 11.6–15.4)
WBC: 5 10*3/uL (ref 3.4–10.8)

## 2022-08-07 LAB — LIPID PANEL
Chol/HDL Ratio: 5 ratio (ref 0.0–5.0)
Cholesterol, Total: 203 mg/dL — ABNORMAL HIGH (ref 100–199)
HDL: 41 mg/dL (ref >39)
LDL Chol Calc (NIH): 142 mg/dL — ABNORMAL HIGH (ref 0–99)
Triglycerides: 112 mg/dL (ref 0–149)
VLDL Cholesterol Cal: 20 mg/dL (ref 5–40)

## 2022-08-07 LAB — CMP14+EGFR
ALT: 21 IU/L (ref 0–44)
AST: 15 IU/L (ref 0–40)
Albumin/Globulin Ratio: 1.7 (ref 1.2–2.2)
Albumin: 4.4 g/dL (ref 3.9–4.9)
Alkaline Phosphatase: 71 IU/L (ref 44–121)
BUN/Creatinine Ratio: 11 (ref 10–24)
BUN: 10 mg/dL (ref 8–27)
Bilirubin Total: 0.2 mg/dL (ref 0.0–1.2)
CO2: 23 mmol/L (ref 20–29)
Calcium: 10 mg/dL (ref 8.6–10.2)
Chloride: 106 mmol/L (ref 96–106)
Creatinine, Ser: 0.93 mg/dL (ref 0.76–1.27)
Globulin, Total: 2.6 g/dL (ref 1.5–4.5)
Glucose: 65 mg/dL — ABNORMAL LOW (ref 70–99)
Potassium: 4.6 mmol/L (ref 3.5–5.2)
Sodium: 140 mmol/L (ref 134–144)
Total Protein: 7 g/dL (ref 6.0–8.5)
eGFR: 93 mL/min/{1.73_m2} (ref >59)

## 2022-08-14 NOTE — Progress Notes (Signed)
[facility]  Logan Bailey, is a 62 y.o. male  [id number]  [id number]:[id number]  DOB - 1960-10-28  Chief Complaint  Patient presents with   leg cramps        Subjective:   Logan Bailey is a 62 y.o. male here today for a acute visit for bilateral legs cramps. 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 Pain/spasm present ater her is out of his hard wear. Patient has No headache, No chest pain, No abdominal pain - No Nausea, No new weakness tingling or numbness, No Cough - shortness of breath  No problems updated.  Allergies  Allergen Reactions   Bee Venom Anaphylaxis and Hives    Past Medical History:  Diagnosis Date   Depression    GERD (gastroesophageal reflux disease)    Seasonal allergies     Current Outpatient Medications on File Prior to Visit  Medication Sig Dispense Refill   EPINEPHrine 0.3 mg/0.3 mL IJ SOAJ injection Inject 0.3 mLs (0.3 mg total) into the muscle as needed for anaphylaxis. 1 each 0   gabapentin (NEURONTIN) 300 MG capsule take 1 capsule (300 mg total) by mouth three times daily 90 capsule 2   gabapentin (NEURONTIN) 300 MG capsule Take 1 capsule (300 mg total) by mouth 3 (three) times daily as directed. 90 capsule 2   lumateperone tosylate (CAPLYTA) 42 MG capsule Take 1 capsule (42 mg total) by mouth daily as directed. 30 capsule 2   omeprazole (PRILOSEC) 40 MG capsule Take 1 capsule (40 mg total) by mouth daily. 30 capsule 5   PERSERIS 90 MG PRSY SMARTSIG:1 Milligram(s) IM Every 4 Weeks     polyethylene glycol powder (GLYCOLAX/MIRALAX) 17 GM/SCOOP powder TAKE 17 GRAMS BY MOUTH DAILY 510 g 2   risperiDONE ER (PERSERIS) 90 MG PRSY Inject 1 mg by Intramuscularly route every 4 weeks. 1 each 2   [DISCONTINUED] buPROPion (WELLBUTRIN XL) 150 MG 24 hr tablet Take 1 tablet (150 mg total) by mouth daily. For smoking cessation 90 tablet 1   [DISCONTINUED] famotidine (PEPCID) 20 MG tablet Take 1 tablet (20 mg  total) by mouth 2 (two) times daily. (Patient not taking: No sig reported) 30 tablet 0   No current facility-administered medications on file prior to visit.   Comprehensive ROS Pertinent positive and negative noted in HPI   Objective:   Vitals:   08/06/22 0940  BP: 119/81  Pulse: 60  SpO2: 97%  Weight: 188 lb 6.4 oz (85.5 kg)    Exam General appearance : Awake, alert, not in any distress. Speech Clear. Not toxic looking HEENT: Atraumatic and Normocephalic, pupils equally reactive to light and accomodation Neck: Supple, no JVD. No cervical lymphadenopathy.  Chest: Good air entry bilaterally, no added sounds  CVS: S1 S2 regular, no murmurs.  Abdomen: Bowel sounds present, Non tender and not distended with no gaurding, rigidity or rebound. Extremities: B/L Lower Ext shows no edema, Neurology: Awake alert, and oriented X 3, CN II-XII intact, Non focal Skin: No Rash  Data Review No results found for: "HGBA1C"  Assessment & Plan   1. Need for immunization against influenza - Flu Vaccine QUAD 66mo+IM (Fluarix, Fluzone & Alfiuria Quad PF)  2. Tobacco use disorder - Ambulatory Referral Lung Cancer Screening [facility]  3. Medication management - CMP14+EGFR - Lipid Panel - CBC with Differential  4. Bilateral leg cramps  R/o electrolyte deficiency and suggested wearing compression hoses at bedtime. C/o having to pay for  them calculated his daily cost of cigarettes just 1 ppd instead OD 2ppd for 2 days will pay fose the stockings   Patient have been counseled extensively about nutrition and exercise. Other issues discussed during this visit include: low cholesterol diet, weight control and daily exercise, foot care, annual eye examinations at Ophthalmology, importance of adherence with medications and regular follow-up. We also discussed long term complications of uncontrolled diabetes and hypertension.   Return in about 3 months (around 11/05/2022).  The patient was given  clear instructions to go to ER or return to medical center if symptoms don't improve, worsen or new problems develop. The patient verbalized understanding. The patient was told to call to get lab results if they haven't heard anything in the next week.   This note has been created with Surveyor, quantity. Any transcriptional errors are unintentional.   Kerin Perna, NP 08/14/2022, 8:42 PM

## 2022-08-24 ENCOUNTER — Other Ambulatory Visit: Payer: Self-pay

## 2022-08-24 MED ORDER — PERSERIS 90 MG ~~LOC~~ PRSY
PREFILLED_SYRINGE | SUBCUTANEOUS | 2 refills | Status: AC
  Filled 2022-08-24: qty 1, 28d supply, fill #0

## 2022-09-03 ENCOUNTER — Other Ambulatory Visit: Payer: Self-pay

## 2022-09-14 ENCOUNTER — Other Ambulatory Visit: Payer: Self-pay

## 2022-09-14 MED ORDER — GABAPENTIN 300 MG PO CAPS
300.0000 mg | ORAL_CAPSULE | Freq: Three times a day (TID) | ORAL | 0 refills | Status: AC
  Filled 2022-09-14: qty 90, 30d supply, fill #0

## 2022-09-14 MED ORDER — PERSERIS 90 MG ~~LOC~~ PRSY
1.0000 mg | PREFILLED_SYRINGE | SUBCUTANEOUS | 2 refills | Status: AC
  Filled 2022-09-14: qty 1, 28d supply, fill #0
  Filled 2022-09-15: qty 1, 30d supply, fill #0

## 2022-09-15 ENCOUNTER — Other Ambulatory Visit: Payer: Self-pay

## 2022-10-12 ENCOUNTER — Encounter: Payer: Self-pay | Admitting: Podiatry

## 2022-10-12 ENCOUNTER — Ambulatory Visit (INDEPENDENT_AMBULATORY_CARE_PROVIDER_SITE_OTHER): Payer: Medicaid Other | Admitting: Podiatry

## 2022-10-12 VITALS — BP 123/72

## 2022-10-12 DIAGNOSIS — Q828 Other specified congenital malformations of skin: Secondary | ICD-10-CM | POA: Diagnosis not present

## 2022-10-12 DIAGNOSIS — M79671 Pain in right foot: Secondary | ICD-10-CM

## 2022-10-12 DIAGNOSIS — B351 Tinea unguium: Secondary | ICD-10-CM

## 2022-10-12 DIAGNOSIS — M79675 Pain in left toe(s): Secondary | ICD-10-CM

## 2022-10-12 DIAGNOSIS — M79672 Pain in left foot: Secondary | ICD-10-CM

## 2022-10-12 DIAGNOSIS — M79674 Pain in right toe(s): Secondary | ICD-10-CM

## 2022-10-12 NOTE — Progress Notes (Unsigned)
  Subjective:  Patient ID: Logan Bailey, male    DOB: 1961-06-18,  MRN: [id number] Chief Complaint  Patient presents with   Nail Problem    [facility] PCP-Michelle Edwards PCP VST-07/2022  63 y.o. male presents painful porokeratotic lesion(s) b/l feet and painful mycotic toenails that limit ambulation. Painful toenails interfere with ambulation. Aggravating factors include wearing enclosed shoe gear. Pain is relieved with periodic professional debridement. Painful porokeratotic lesions are aggravated when weightbearing with and without shoegear. Pain is relieved with periodic professional debridement.  New problem(s): None   PCP is Kerin Perna, NP.  Allergies  Allergen Reactions   Bee Venom Anaphylaxis and Hives    Review of Systems: Negative except as noted in the HPI.   Objective:  Logan Bailey is a pleasant 62 y.o. male WD, WN in NAD.Marland Kitchen AAO x 3.  Vascular Examination: Vascular status intact b/l with palpable pedal pulses. CFT immediate b/l. Pedal hair present.No edema. No pain with calf compression b/l. Skin temperature gradient WNL b/l. No varicosities noted.  Neurological Examination: Sensation grossly intact b/l with 10 gram monofilament. Vibratory sensation intact b/l.  Dermatological Examination: Pedal skin with normal turgor, texture and tone b/l. No open wounds nor interdigital macerations noted. Toenails 1-5 b/l thick, discolored, elongated with subungual debris and pain on dorsal palpation. No hyperkeratotic lesions noted b/l.   Musculoskeletal Examination: Muscle strength 5/5 to b/l LE.  No pain, crepitus noted b/l. No gross pedal deformities. Patient ambulates independently without assistive aids.   Radiographs: None  Assessment:   1. Pain due to onychomycosis of toenails of both feet   2. Porokeratosis   3. Pain in both feet    Plan:  -Patient was evaluated and treated. All patient's and/or POA's questions/concerns answered on today's visit. -No new  findings. No new orders. -Medicaid ABN on file for paring of corn(s)/callus(es)/porokeratos(es). Copy in patient chart. -Patient to continue soft, supportive shoe gear daily. -Toenails 1-5 b/l were debrided in length and girth with sterile nail nippers and dremel without iatrogenic bleeding.  -Porokeratotic lesion(s) submet head 1 b/l and submet head 5 b/l pared and enucleated with sterile currette without incident. Total number of lesions debrided=4. -Patient/POA to call should there be question/concern in the interim.  Return in about 3 months (around 01/12/2023).  Marzetta Board, DPM

## 2022-10-20 ENCOUNTER — Other Ambulatory Visit: Payer: Self-pay

## 2022-10-20 ENCOUNTER — Other Ambulatory Visit (HOSPITAL_COMMUNITY): Payer: Self-pay

## 2022-10-20 MED ORDER — GABAPENTIN 300 MG PO CAPS
300.0000 mg | ORAL_CAPSULE | Freq: Three times a day (TID) | ORAL | 2 refills | Status: AC | PRN
  Filled 2022-10-20: qty 90, 30d supply, fill #0

## 2022-11-01 ENCOUNTER — Encounter (INDEPENDENT_AMBULATORY_CARE_PROVIDER_SITE_OTHER): Payer: Self-pay | Admitting: Primary Care

## 2022-11-01 ENCOUNTER — Encounter: Payer: Self-pay | Admitting: *Deleted

## 2022-11-01 ENCOUNTER — Ambulatory Visit (INDEPENDENT_AMBULATORY_CARE_PROVIDER_SITE_OTHER): Payer: Medicaid Other | Admitting: Primary Care

## 2022-11-01 VITALS — BP 123/79 | HR 43 | Resp 16 | Ht 74.0 in | Wt 177.6 lb

## 2022-11-01 DIAGNOSIS — M79604 Pain in right leg: Secondary | ICD-10-CM | POA: Diagnosis not present

## 2022-11-01 DIAGNOSIS — M79605 Pain in left leg: Secondary | ICD-10-CM

## 2022-11-01 NOTE — Progress Notes (Signed)
Logan  Haneef Bailey, is a 62 y.o. male  [id number]  [id number]  DOB - 04/05/1961  Chief Complaint  Patient presents with   Leg Pain    B/l leg pain        Subjective:   Logan Bailey is a 62 y.o. male here today for a acute visit.  He voices concerns about bilateral leg pain. Denies and trauma or falls. Patient has No headache, No chest pain, No abdominal pain - No Nausea, No new weakness tingling or numbness, No Cough - shortness of breath  No problems updated.  Allergies  Allergen Reactions   Bee Venom Anaphylaxis and Hives    Past Medical History:  Diagnosis Date   Depression    GERD (gastroesophageal reflux disease)    Seasonal allergies     Current Outpatient Medications on File Prior to Visit  Medication Sig Dispense Refill   EPINEPHrine 0.3 mg/0.3 mL IJ SOAJ injection Inject 0.3 mLs (0.3 mg total) into the muscle as needed for anaphylaxis. 1 each 0   gabapentin (NEURONTIN) 300 MG capsule take 1 capsule (300 mg total) by mouth three times daily 90 capsule 2   gabapentin (NEURONTIN) 300 MG capsule Take 1 capsule (300 mg total) by mouth 3 (three) times daily. 90 capsule 0   gabapentin (NEURONTIN) 300 MG capsule Take 1 capsule (300 mg total) by mouth 3 (three) times daily as needed. 90 capsule 2   lumateperone tosylate (CAPLYTA) 42 MG capsule Take 1 capsule (42 mg total) by mouth daily as directed. 30 capsule 2   omeprazole (PRILOSEC) 40 MG capsule Take 1 capsule (40 mg total) by mouth daily. 30 capsule 5   PERSERIS 90 MG PRSY SMARTSIG:1 Milligram(s) IM Every 4 Weeks     polyethylene glycol powder (GLYCOLAX/MIRALAX) 17 GM/SCOOP powder TAKE 17 GRAMS BY MOUTH DAILY 510 g 2   risperiDONE ER (PERSERIS) 90 MG PRSY Inject 1 mg by Intramuscularly route every 4 weeks. 1 each 2   risperiDONE ER (PERSERIS) 90 MG PRSY Inject 1 mg intramuscularly every four weeks as directed 1 each 2   risperiDONE ER (PERSERIS) 90 MG PRSY Inject 1 mg into the  muscle every every four weeks as directed 1 each 2   [DISCONTINUED] buPROPion (WELLBUTRIN XL) 150 MG 24 hr tablet Take 1 tablet (150 mg total) by mouth daily. For smoking cessation 90 tablet 1   [DISCONTINUED] famotidine (PEPCID) 20 MG tablet Take 1 tablet (20 mg total) by mouth 2 (two) times daily. (Patient not taking: No sig reported) 30 tablet 0   No current facility-administered medications on file prior to visit.    Objective:   Vitals:   11/01/22 1050  BP: 123/79  Pulse: (Abnormal) 43  Resp: 16  SpO2: 100%  Weight: 177 lb 9.6 oz (80.6 kg)  Height: 6\' 2"  (1.88 m)    Comprehensive ROS Pertinent positive and negative noted in HPI   Exam General appearance : Awake, alert, not in any distress. Speech Clear. Not toxic looking HEENT: Atraumatic and Normocephalic, pupils equally reactive to light and accomodation Neck: Supple, no JVD. No cervical lymphadenopathy.  Chest: Good air entry bilaterally, no added sounds  CVS: S1 S2 regular, no murmurs.  Abdomen: Bowel sounds present, Non tender and not distended with no gaurding, rigidity or rebound. Extremities: B/L Lower Ext shows no edema, both legs are warm to touch Neurology: Awake alert, and oriented X 3,  Non focal Skin: No Rash  Data Review No results found for: "HGBA1C"  Assessment & Plan  [name] was seen today for leg pain.  Diagnoses and all orders for this visit:  Bilateral leg pain -     Ambulatory referral to Physical Therapy  Patient have been counseled extensively about nutrition and exercise. Other issues discussed during this visit include: low cholesterol diet, weight control and daily exercise, foot care, annual eye examinations at Ophthalmology, importance of adherence with medications and regular follow-up. We also discussed long term complications of uncontrolled diabetes and hypertension.   No follow-ups on file.  The patient was given clear instructions to go to ER or return to medical center if  symptoms don't improve, worsen or new problems develop. The patient verbalized understanding. The patient was told to call to get lab results if they haven't heard anything in the next week.   This note has been created with Surveyor, quantity. Any transcriptional errors are unintentional.   Kerin Perna, NP 11/01/2022, 11:05 AM

## 2022-11-16 NOTE — Therapy (Unsigned)
OUTPATIENT PHYSICAL THERAPY LOWER EXTREMITY EVALUATION   Patient Name: Logan Bailey MRN: [id number] DOB:1960-12-03, 62 y.o., male Today's Date: 11/16/2022  END OF SESSION:   Past Medical History:  Diagnosis Date   Depression    GERD (gastroesophageal reflux disease)    Seasonal allergies    Past Surgical History:  Procedure Laterality Date   HERNIA REPAIR  10/16/2012   incarcerated   INGUINAL HERNIA REPAIR Left 10/16/2012   Procedure: HERNIA REPAIR INGUINAL ADULT;  Surgeon: Axel Filler, MD;  Location: [facility] OR;  Service: General;  Laterality: Left;   Patient Active Problem List   Diagnosis Date Noted   Poor dentition 06/23/2018   Tobacco use disorder 06/23/2018   Depression with anxiety 06/23/2018   Chronic bilateral low back pain with bilateral sciatica 06/23/2018   Porokeratosis 01/13/2018    PCP: Grayce Sessions, NP  REFERRING PROVIDER: Grayce Sessions, NP  REFERRING DIAG: M79.604,M79.605 (ICD-10-CM) - Bilateral leg pain  THERAPY DIAG:  No diagnosis found.  Rationale for Evaluation and Treatment: Rehabilitation  ONSET DATE: chronic  SUBJECTIVE:   SUBJECTIVE STATEMENT: ***  PERTINENT HISTORY: Logan Bailey is a 62 y.o. male here today for a acute visit.  He voices concerns about bilateral leg pain. Denies and trauma or falls. Patient has No headache, No chest pain, No abdominal pain - No Nausea, No new weakness tingling or numbness, No Cough - shortness of breath   No problems updated. PAIN:  Are you having pain? {OPRCPAIN:27236}  PRECAUTIONS: None  WEIGHT BEARING RESTRICTIONS: No  FALLS:  Has patient fallen in last 6 months? No  OCCUPATION: ***  PLOF: Independent  PATIENT GOALS: ***  NEXT MD VISIT: ***  OBJECTIVE:   DIAGNOSTIC FINDINGS: none available  PATIENT SURVEYS:  LEFS ***    MUSCLE LENGTH: Hamstrings: Right *** deg; Left *** deg Thomas test: Right *** deg; Left *** deg  POSTURE:  {posture:25561}  PALPATION: ***  LOWER EXTREMITY ROM:  {AROM/PROM:27142} ROM Right eval Left eval  Hip flexion    Hip extension    Hip abduction    Hip adduction    Hip internal rotation    Hip external rotation    Knee flexion    Knee extension    Ankle dorsiflexion    Ankle plantarflexion    Ankle inversion    Ankle eversion     (Blank rows = not tested)  LOWER EXTREMITY MMT:  MMT Right eval Left eval  Hip flexion    Hip extension    Hip abduction    Hip adduction    Hip internal rotation    Hip external rotation    Knee flexion    Knee extension    Ankle dorsiflexion    Ankle plantarflexion    Ankle inversion    Ankle eversion     (Blank rows = not tested)  LOWER EXTREMITY SPECIAL TESTS:  {LEspecialtests:26242}  FUNCTIONAL TESTS:  5 times sit to stand: ***  GAIT: Distance walked: *** Assistive device utilized: {Assistive devices:23999} Level of assistance: {Levels of assistance:24026} Comments: ***   TODAY'S TREATMENT:  DATE: ***    PATIENT EDUCATION:  Education details: Discussed eval findings, rehab rationale and POC and patient is in agreement  Person educated: Patient Education method: Explanation Education comprehension: verbalized understanding and needs further education  HOME EXERCISE PROGRAM: ***  ASSESSMENT:  CLINICAL IMPRESSION: Patient is a *** y.o. *** who was seen today for physical therapy evaluation and treatment for ***.   OBJECTIVE IMPAIRMENTS: {opptimpairments:25111}.   ACTIVITY LIMITATIONS: {activitylimitations:27494}  PARTICIPATION LIMITATIONS: {participationrestrictions:25113}  PERSONAL FACTORS: {Personal factors:25162} are also affecting patient's functional outcome.   REHAB POTENTIAL: Good  CLINICAL DECISION MAKING: Stable/uncomplicated  EVALUATION COMPLEXITY: Low   GOALS: Goals  reviewed with patient? No  SHORT TERM GOALS: Target date: *** *** Baseline: Goal status: {GOALSTATUS:25110}  2.  *** Baseline:  Goal status: {GOALSTATUS:25110}  3.  *** Baseline:  Goal status: {GOALSTATUS:25110}  4.  *** Baseline:  Goal status: {GOALSTATUS:25110}  5.  *** Baseline:  Goal status: {GOALSTATUS:25110}  6.  *** Baseline:  Goal status: {GOALSTATUS:25110}  LONG TERM GOALS: Target date: ***  *** Baseline:  Goal status: {GOALSTATUS:25110}  2.  *** Baseline:  Goal status: {GOALSTATUS:25110}  3.  *** Baseline:  Goal status: {GOALSTATUS:25110}  4.  *** Baseline:  Goal status: {GOALSTATUS:25110}  5.  *** Baseline:  Goal status: {GOALSTATUS:25110}  6.  *** Baseline:  Goal status: {GOALSTATUS:25110}   PLAN:  PT FREQUENCY: {rehab frequency:25116}  PT DURATION: {rehab duration:25117}  PLANNED INTERVENTIONS: {rehab planned interventions:25118::"Therapeutic exercises","Therapeutic activity","Neuromuscular re-education","Balance training","Gait training","Patient/Family education","Self Care","Joint mobilization"}  PLAN FOR NEXT SESSION: ***   Hildred Laser, PT 11/16/2022, 10:48 AM

## 2022-11-17 ENCOUNTER — Other Ambulatory Visit: Payer: Self-pay

## 2022-11-17 ENCOUNTER — Ambulatory Visit: Payer: Medicaid Other | Attending: Primary Care

## 2022-11-17 DIAGNOSIS — M6281 Muscle weakness (generalized): Secondary | ICD-10-CM | POA: Diagnosis present

## 2022-11-17 DIAGNOSIS — M79604 Pain in right leg: Secondary | ICD-10-CM | POA: Insufficient documentation

## 2022-11-17 DIAGNOSIS — M79605 Pain in left leg: Secondary | ICD-10-CM | POA: Diagnosis not present

## 2022-11-22 ENCOUNTER — Other Ambulatory Visit: Payer: Self-pay

## 2022-11-22 DIAGNOSIS — F17210 Nicotine dependence, cigarettes, uncomplicated: Secondary | ICD-10-CM

## 2022-11-22 DIAGNOSIS — Z87891 Personal history of nicotine dependence: Secondary | ICD-10-CM

## 2022-11-24 ENCOUNTER — Ambulatory Visit: Payer: Medicaid Other

## 2022-11-24 DIAGNOSIS — M79605 Pain in left leg: Secondary | ICD-10-CM

## 2022-11-24 DIAGNOSIS — M6281 Muscle weakness (generalized): Secondary | ICD-10-CM

## 2022-11-24 DIAGNOSIS — M79604 Pain in right leg: Secondary | ICD-10-CM

## 2022-11-24 NOTE — Therapy (Signed)
OUTPATIENT PHYSICAL THERAPY TREATMENT NOTE   Patient Name: Logan Bailey MRN: [id number] DOB:11-08-1960, 62 y.o., male Today's Date: 11/24/2022  PCP: Grayce Sessions, NP  REFERRING PROVIDER: Grayce Sessions, NP   END OF SESSION:   PT End of Session - 11/24/22 1209     Visit Number 2    Number of Visits 8    Date for PT Re-Evaluation 01/12/23    Authorization Type [place] [employer]    PT Start Time 1210    PT Stop Time 1248    PT Time Calculation (min) 38 min    Activity Tolerance Patient tolerated treatment well    Behavior During Therapy WFL for tasks assessed/performed             Past Medical History:  Diagnosis Date   Depression    GERD (gastroesophageal reflux disease)    Seasonal allergies    Past Surgical History:  Procedure Laterality Date   HERNIA REPAIR  10/16/2012   incarcerated   INGUINAL HERNIA REPAIR Left 10/16/2012   Procedure: HERNIA REPAIR INGUINAL ADULT;  Surgeon: Logan Filler, MD;  Location: [facility] OR;  Service: General;  Laterality: Left;   Patient Active Problem List   Diagnosis Date Noted   Poor dentition 06/23/2018   Tobacco use disorder 06/23/2018   Depression with anxiety 06/23/2018   Chronic bilateral low back pain with bilateral sciatica 06/23/2018   Porokeratosis 01/13/2018    REFERRING DIAG: M79.604,M79.605 (ICD-10-CM) - Bilateral leg pain   THERAPY DIAG:  Pain in left leg  Pain in right leg  Muscle weakness (generalized)  Rationale for Evaluation and Treatment Rehabilitation  PERTINENT HISTORY:  Logan Bailey is a 62 y.o. male here today for a acute visit.  He voices concerns about bilateral leg pain. Denies and trauma or falls. Patient has No headache, No chest pain, No abdominal pain - No Nausea, No new weakness tingling or numbness, No Cough - shortness of breath   No problems updated.  PRECAUTIONS: None   SUBJECTIVE:                                                                                                                                                                                       SUBJECTIVE STATEMENT:  Pt presents to PT with reports of decreased symptoms in L LE. Does have continued pain in R LE. Has been compliant with HEP, "the exercises have been cramping my legs up a bit".   PAIN:  Are you having pain?  Yes: NPRS scale: "10+"/10 Pain location: R anterior LE Pain description: cramping Aggravating factors: prolonged standing Relieving factors: position changes   OBJECTIVE: (objective measures completed at  initial evaluation unless otherwise dated)  DIAGNOSTIC FINDINGS: none available   PATIENT SURVEYS:  LEFS 24/80 (30% functional)       MUSCLE LENGTH: Hamstrings: Right 70d deg; Left 80 deg   POSTURE: No Significant postural limitations   PALPATION: Taught bands in B gastrocs and hamstring groups   LOWER EXTREMITY ROM:   A/PROM Right eval Left eval  Hip flexion [facility]  Hip extension      Hip abduction      Hip adduction      Hip internal rotation      Hip external rotation      Knee flexion 150d 150d  Knee extension 0d 0d  Ankle dorsiflexion 8/15d 4/15d  Ankle plantarflexion [facility] WFL  Ankle inversion [facility] WFL  Ankle eversion WFL WFL   (Blank rows = not tested)   LOWER EXTREMITY MMT:   MMT Right eval Left eval  Hip flexion 4 4  Hip extension 4 4  Hip abduction 4 4  Hip adduction      Hip internal rotation      Hip external rotation      Knee flexion 4 4  Knee extension 4 4  Ankle dorsiflexion 4 4  Ankle plantarflexion 4- 4-  Ankle inversion      Ankle eversion       (Blank rows = not tested)   LOWER EXTREMITY SPECIAL TESTS:  Deferred based on diffuse symptoms   FUNCTIONAL TESTS:  5 times sit to stand: 18s arms crossed   GAIT: Distance walked: 51ft x2 Assistive device utilized: None Level of assistance: Complete Independence Comments: antalgic     TREATMENT: [facility] Adult PT Treatment:                                                DATE:  11/24/2022 Therapeutic Exercise: NuStep lvl 4 UE/LE x 4 min while taking subjective Seated hamstring stretch 2x30" Quad sets x 10 - 5" hold Supine SLR 2x10 each Seated clamshell 2x15 GTB Slant board gastoc stretch 2x30" Slant board soleus stretch 2x30"  Mini squat B UE 2x10  Standing hip abd/ext 2x10 each Standing gastroc stretch x 30" each   PATIENT EDUCATION:  Education details: Continue HEP Person educated: Patient Education method: Explanation Education comprehension: verbalized understanding and needs further education   HOME EXERCISE PROGRAM: Access Code: [email or web address] URL: [email or web address] Date: 11/24/2022 Prepared by: Logan Bailey  Exercises - Sit to Stand with Arms Crossed  - 2 x daily - 5 x weekly - 1 sets - 5 reps - Standing Heel Raises  - 2 x daily - 5 x weekly - 2 sets - 10 reps - Seated Hamstring Stretch  - 1 x daily - 7 x weekly - 2 reps - 30 sec hold - Gastroc Stretch on Wall  - 1 x daily - 7 x weekly - 2 reps - 30 sec hold   ASSESSMENT:   CLINICAL IMPRESSION: Pt able to complete all prescribed exercises with no adverse effect. Therapy focused on LE strengthening and improving muscle length and comfort. HEP updated for stretching exercises at home. Will continu eot progress per POC as prescribed.    OBJECTIVE IMPAIRMENTS: decreased activity tolerance, decreased endurance, decreased knowledge of condition, difficulty walking, decreased ROM, increased muscle spasms, and pain.    ACTIVITY LIMITATIONS: standing and squatting   PERSONAL FACTORS: Age, Past/current experiences,  and Time since onset of injury/illness/exacerbation are also affecting patient's functional outcome.    GOALS: Goals reviewed with patient? No   SHORT TERM GOALS: Target date: 12/15/2022   Patient to demonstrate independence in HEP  Baseline: [id number] Goal status: INITIAL   2.  Increase LEFS score to 32/80  Baseline: 24/80 Goal status: INITIAL   3.  Decrease 5s STS  time to 15s arms crossed Baseline: 18s Goal status: INITIAL   4.  Increase global BLE strength to 4+/5 Baseline:  MMT Right eval Left eval  Hip flexion 4 4  Hip extension 4 4  Hip abduction 4 4  Hip adduction      Hip internal rotation      Hip external rotation      Knee flexion 4 4  Knee extension 4 4  Ankle dorsiflexion 4 4  Ankle plantarflexion 4- 4-    Goal status: INITIAL   PLAN:   PT FREQUENCY: 2x/week   PT DURATION: 4 weeks   PLANNED INTERVENTIONS: Therapeutic exercises, Therapeutic activity, Neuromuscular re-education, Balance training, Gait training, Patient/Family education, Self Care, Joint mobilization, Stair training, Dry Needling, Manual therapy, and Re-evaluation   PLAN FOR NEXT SESSION: HEP review and update, aerobic work, stretching and flexibility tasks, strengthening   Eloy End, PT 11/24/2022, 1:38 PM

## 2022-11-30 ENCOUNTER — Ambulatory Visit: Payer: Medicaid Other

## 2022-12-02 ENCOUNTER — Ambulatory Visit: Payer: Medicaid Other

## 2022-12-02 DIAGNOSIS — M79605 Pain in left leg: Secondary | ICD-10-CM | POA: Diagnosis not present

## 2022-12-02 DIAGNOSIS — M79604 Pain in right leg: Secondary | ICD-10-CM

## 2022-12-02 DIAGNOSIS — M6281 Muscle weakness (generalized): Secondary | ICD-10-CM

## 2022-12-02 NOTE — Therapy (Addendum)
OUTPATIENT PHYSICAL THERAPY TREATMENT NOTE   Patient Name: Logan Bailey MRN: [id number] DOB:28-Oct-1960, 62 y.o., male Today's Date: 12/02/2022  PCP: Grayce Sessions, NP  REFERRING PROVIDER: Grayce Sessions, NP   END OF SESSION:   PT End of Session - 12/02/22 1214     Visit Number 3    Number of Visits 8    Date for PT Re-Evaluation 01/12/23    Authorization Type [place] MCD    PT Start Time 1215    PT Stop Time 1255    PT Time Calculation (min) 40 min    Activity Tolerance Patient tolerated treatment well    Behavior During Therapy [facility] for tasks assessed/performed              Past Medical History:  Diagnosis Date   Depression    GERD (gastroesophageal reflux disease)    Seasonal allergies    Past Surgical History:  Procedure Laterality Date   HERNIA REPAIR  10/16/2012   incarcerated   INGUINAL HERNIA REPAIR Left 10/16/2012   Procedure: HERNIA REPAIR INGUINAL ADULT;  Surgeon: Axel Filler, MD;  Location: [facility] OR;  Service: General;  Laterality: Left;   Patient Active Problem List   Diagnosis Date Noted   Poor dentition 06/23/2018   Tobacco use disorder 06/23/2018   Depression with anxiety 06/23/2018   Chronic bilateral low back pain with bilateral sciatica 06/23/2018   Porokeratosis 01/13/2018    REFERRING DIAG: M79.604,M79.605 (ICD-10-CM) - Bilateral leg pain   THERAPY DIAG:  Pain in left leg  Pain in right leg  Muscle weakness (generalized)  Rationale for Evaluation and Treatment Rehabilitation  PERTINENT HISTORY:  Logan Bailey is a 62 y.o. male here today for a acute visit.  He voices concerns about bilateral leg pain. Denies and trauma or falls. Patient has No headache, No chest pain, No abdominal pain - No Nausea, No new weakness tingling or numbness, No Cough - shortness of breath   No problems updated.  PRECAUTIONS: None   SUBJECTIVE:                                                                                                                                                                                       SUBJECTIVE STATEMENT: Overall better, still cites R thigh discomfort/tightness  PAIN:  Are you having pain?  Yes: NPRS scale: "10+"/10 Pain location: R anterior LE Pain description: cramping Aggravating factors: prolonged standing Relieving factors: position changes   OBJECTIVE: (objective measures completed at initial evaluation unless otherwise dated)  DIAGNOSTIC FINDINGS: none available   PATIENT SURVEYS:  LEFS 24/80 (30% functional)       MUSCLE LENGTH: Hamstrings:  Right 70d deg; Left 80 deg   POSTURE: No Significant postural limitations   PALPATION: Taught bands in B gastrocs and hamstring groups   LOWER EXTREMITY ROM:   A/PROM Right eval Left eval  Hip flexion [facility]  Hip extension      Hip abduction      Hip adduction      Hip internal rotation      Hip external rotation      Knee flexion 150d 150d  Knee extension 0d 0d  Ankle dorsiflexion 8/15d 4/15d  Ankle plantarflexion [facility] WFL  Ankle inversion [facility] WFL  Ankle eversion WFL WFL   (Blank rows = not tested)   LOWER EXTREMITY MMT:   MMT Right eval Left eval  Hip flexion 4 4  Hip extension 4 4  Hip abduction 4 4  Hip adduction      Hip internal rotation      Hip external rotation      Knee flexion 4 4  Knee extension 4 4  Ankle dorsiflexion 4 4  Ankle plantarflexion 4- 4-  Ankle inversion      Ankle eversion       (Blank rows = not tested)   LOWER EXTREMITY SPECIAL TESTS:  Deferred based on diffuse symptoms   FUNCTIONAL TESTS:  5 times sit to stand: 18s arms crossed   GAIT: Distance walked: 47ft x2 Assistive device utilized: None Level of assistance: Complete Independence Comments: antalgic     TREATMENT: [facility] Adult PT Treatment:                                                DATE: 12/02/22 Therapeutic Exercise: Nustep L4 8 min Seated hamstring stretch 30s x2 B Supine SLR 2# 2x15 Bil SAQs 2# 2x15  Bil PKB 30s x2 B Supine hip fallouts GTB 15x B, 15/15 unilaterally PF against wall 15x B Seated PF stretch with towel 30s x2  [facility] Adult PT Treatment:                                                DATE: 11/24/2022 Therapeutic Exercise: NuStep lvl 4 UE/LE x 4 min while taking subjective Seated hamstring stretch 2x30" Quad sets x 10 - 5" hold Supine SLR 2x10  Seated clamshell 2x15 GTB Slant board gastoc stretch 2x30" Slant board soleus stretch 2x30"  Mini squat B UE 2x10  Standing hip abd/ext 2x10 each Standing gastroc stretch x 30" each   PATIENT EDUCATION:  Education details: Continue HEP Person educated: Patient Education method: Explanation Education comprehension: verbalized understanding and needs further education   HOME EXERCISE PROGRAM: Access Code: [email or web address] URL: [email or web address] Date: 11/24/2022 Prepared by: Edwinna Areola  Exercises - Sit to Stand with Arms Crossed  - 2 x daily - 5 x weekly - 1 sets - 5 reps - Standing Heel Raises  - 2 x daily - 5 x weekly - 2 sets - 10 reps - Seated Hamstring Stretch  - 1 x daily - 7 x weekly - 2 reps - 30 sec hold - Gastroc Stretch on Wall  - 1 x daily - 7 x weekly - 2 reps - 30 sec hold   ASSESSMENT:   CLINICAL IMPRESSION: Today's session focused TKE  due to loss of it with gait observation.  Added weight, time and resistance to all tasks.  Incorporated PKB to stretch of quadriceps. Added PF stretch due to c/o arch pain/tightness.   OBJECTIVE IMPAIRMENTS: decreased activity tolerance, decreased endurance, decreased knowledge of condition, difficulty walking, decreased ROM, increased muscle spasms, and pain.    ACTIVITY LIMITATIONS: standing and squatting   PERSONAL FACTORS: Age, Past/current experiences, and Time since onset of injury/illness/exacerbation are also affecting patient's functional outcome.    GOALS: Goals reviewed with patient? No   SHORT TERM GOALS=LONG TERM GOALS: Target date: 12/15/2022    Patient to demonstrate independence in HEP  Baseline: [id number] Goal status: Met   2.  Increase LEFS score to 32/80  Baseline: 24/80 Goal status: Ongoing   3.  Decrease 5s STS time to 15s arms crossed Baseline: 18s Goal status: Ongoing   4.  Increase global BLE strength to 4+/5 Baseline:  MMT Right eval Left eval  Hip flexion 4 4  Hip extension 4 4  Hip abduction 4 4  Hip adduction      Hip internal rotation      Hip external rotation      Knee flexion 4 4  Knee extension 4 4  Ankle dorsiflexion 4 4  Ankle plantarflexion 4- 4-    Goal status: Ongoing   PLAN:   PT FREQUENCY: 2x/week   PT DURATION: 4 weeks   PLANNED INTERVENTIONS: Therapeutic exercises, Therapeutic activity, Neuromuscular re-education, Balance training, Gait training, Patient/Family education, Self Care, Joint mobilization, Stair training, Dry Needling, Manual therapy, and Re-evaluation   PLAN FOR NEXT SESSION: HEP review and update, aerobic work, stretching and flexibility tasks, strengthening   Hildred Laser, PT 12/02/2022, 1:04 PM

## 2022-12-06 NOTE — Therapy (Deleted)
OUTPATIENT PHYSICAL THERAPY TREATMENT NOTE   Patient Name: Logan Bailey MRN: [id number] DOB:03-Feb-1961, 62 y.o., male Today's Date: 12/06/2022  PCP: Grayce Sessions, NP  REFERRING PROVIDER: Grayce Sessions, NP   END OF SESSION:      Past Medical History:  Diagnosis Date   Depression    GERD (gastroesophageal reflux disease)    Seasonal allergies    Past Surgical History:  Procedure Laterality Date   HERNIA REPAIR  10/16/2012   incarcerated   INGUINAL HERNIA REPAIR Left 10/16/2012   Procedure: HERNIA REPAIR INGUINAL ADULT;  Surgeon: Axel Filler, MD;  Location: [facility] OR;  Service: General;  Laterality: Left;   Patient Active Problem List   Diagnosis Date Noted   Poor dentition 06/23/2018   Tobacco use disorder 06/23/2018   Depression with anxiety 06/23/2018   Chronic bilateral low back pain with bilateral sciatica 06/23/2018   Porokeratosis 01/13/2018    REFERRING DIAG: M79.604,M79.605 (ICD-10-CM) - Bilateral leg pain   THERAPY DIAG:  No diagnosis found.  Rationale for Evaluation and Treatment Rehabilitation  PERTINENT HISTORY:  Logan Bailey is a 62 y.o. male here today for a acute visit.  He voices concerns about bilateral leg pain. Denies and trauma or falls. Patient has No headache, No chest pain, No abdominal pain - No Nausea, No new weakness tingling or numbness, No Cough - shortness of breath   No problems updated.  PRECAUTIONS: None   SUBJECTIVE:                                                                                                                                                                                      SUBJECTIVE STATEMENT: Overall better, still cites R thigh discomfort/tightness  PAIN:  Are you having pain?  Yes: NPRS scale: "10+"/10 Pain location: R anterior LE Pain description: cramping Aggravating factors: prolonged standing Relieving factors: position changes   OBJECTIVE: (objective measures completed at  initial evaluation unless otherwise dated)  DIAGNOSTIC FINDINGS: none available   PATIENT SURVEYS:  LEFS 24/80 (30% functional)       MUSCLE LENGTH: Hamstrings: Right 70d deg; Left 80 deg   POSTURE: No Significant postural limitations   PALPATION: Taught bands in B gastrocs and hamstring groups   LOWER EXTREMITY ROM:   A/PROM Right eval Left eval  Hip flexion [facility]  Hip extension      Hip abduction      Hip adduction      Hip internal rotation      Hip external rotation      Knee flexion 150d 150d  Knee extension 0d 0d  Ankle dorsiflexion 8/15d 4/15d  Ankle plantarflexion [facility]  Ankle inversion [facility]  Ankle eversion WFL WFL   (Blank rows = not tested)   LOWER EXTREMITY MMT:   MMT Right eval Left eval  Hip flexion 4 4  Hip extension 4 4  Hip abduction 4 4  Hip adduction      Hip internal rotation      Hip external rotation      Knee flexion 4 4  Knee extension 4 4  Ankle dorsiflexion 4 4  Ankle plantarflexion 4- 4-  Ankle inversion      Ankle eversion       (Blank rows = not tested)   LOWER EXTREMITY SPECIAL TESTS:  Deferred based on diffuse symptoms   FUNCTIONAL TESTS:  5 times sit to stand: 18s arms crossed   GAIT: Distance walked: 85ft x2 Assistive device utilized: None Level of assistance: Complete Independence Comments: antalgic     TREATMENT: [facility] Adult PT Treatment:                                                DATE: 12/02/22 Therapeutic Exercise: Nustep L4 8 min Seated hamstring stretch 30s x2 B Supine SLR 2# 2x15 Bil SAQs 2# 2x15 Bil PKB 30s x2 B Supine hip fallouts GTB 15x B, 15/15 unilaterally PF against wall 15x B Seated PF stretch with towel 30s x2  [facility] Adult PT Treatment:                                                DATE: 11/24/2022 Therapeutic Exercise: NuStep lvl 4 UE/LE x 4 min while taking subjective Seated hamstring stretch 2x30" Quad sets x 10 - 5" hold Supine SLR 2x10  Seated clamshell 2x15 GTB Slant  board gastoc stretch 2x30" Slant board soleus stretch 2x30"  Mini squat B UE 2x10  Standing hip abd/ext 2x10 each Standing gastroc stretch x 30" each   PATIENT EDUCATION:  Education details: Continue HEP Person educated: Patient Education method: Explanation Education comprehension: verbalized understanding and needs further education   HOME EXERCISE PROGRAM: Access Code: [email or web address] URL: [email or web address] Date: 11/24/2022 Prepared by: Edwinna Areola  Exercises - Sit to Stand with Arms Crossed  - 2 x daily - 5 x weekly - 1 sets - 5 reps - Standing Heel Raises  - 2 x daily - 5 x weekly - 2 sets - 10 reps - Seated Hamstring Stretch  - 1 x daily - 7 x weekly - 2 reps - 30 sec hold - Gastroc Stretch on Wall  - 1 x daily - 7 x weekly - 2 reps - 30 sec hold   ASSESSMENT:   CLINICAL IMPRESSION: Today's session focused TKE due to loss of it with gait observation.  Added weight, time and resistance to all tasks.  Incorporated PKB to stretch of quadriceps. Added PF stretch due to c/o arch pain/tightness.   OBJECTIVE IMPAIRMENTS: decreased activity tolerance, decreased endurance, decreased knowledge of condition, difficulty walking, decreased ROM, increased muscle spasms, and pain.    ACTIVITY LIMITATIONS: standing and squatting   PERSONAL FACTORS: Age, Past/current experiences, and Time since onset of injury/illness/exacerbation are also affecting patient's functional outcome.    GOALS: Goals reviewed with patient? No   SHORT TERM GOALS: Target date: 12/15/2022  Patient to demonstrate independence in HEP  Baseline: [id number] Goal status: INITIAL   2.  Increase LEFS score to 32/80  Baseline: 24/80 Goal status: INITIAL   3.  Decrease 5s STS time to 15s arms crossed Baseline: 18s Goal status: INITIAL   4.  Increase global BLE strength to 4+/5 Baseline:  MMT Right eval Left eval  Hip flexion 4 4  Hip extension 4 4  Hip abduction 4 4  Hip adduction      Hip  internal rotation      Hip external rotation      Knee flexion 4 4  Knee extension 4 4  Ankle dorsiflexion 4 4  Ankle plantarflexion 4- 4-    Goal status: INITIAL   PLAN:   PT FREQUENCY: 2x/week   PT DURATION: 4 weeks   PLANNED INTERVENTIONS: Therapeutic exercises, Therapeutic activity, Neuromuscular re-education, Balance training, Gait training, Patient/Family education, Self Care, Joint mobilization, Stair training, Dry Needling, Manual therapy, and Re-evaluation   PLAN FOR NEXT SESSION: HEP review and update, aerobic work, stretching and flexibility tasks, strengthening   Hildred Laser, PT 12/06/2022, 1:02 PM

## 2022-12-07 ENCOUNTER — Ambulatory Visit: Payer: Medicaid Other

## 2022-12-09 ENCOUNTER — Ambulatory Visit: Payer: Medicaid Other

## 2022-12-10 NOTE — Therapy (Unsigned)
OUTPATIENT PHYSICAL THERAPY TREATMENT NOTE   Patient Name: Logan Bailey MRN: [id number] DOB:11/03/1960, 62 y.o., male Today's Date: 12/14/2022  PCP: Grayce Sessions, NP  REFERRING PROVIDER: Grayce Sessions, NP   END OF SESSION:   PT End of Session - 12/14/22 1218     Visit Number 4    Number of Visits 8    Date for PT Re-Evaluation 01/12/23    Authorization Type [place] [facility]    PT Start Time 1215    PT Stop Time 1255    PT Time Calculation (min) 40 min    Activity Tolerance Patient tolerated treatment well    Behavior During Therapy [facility] for tasks assessed/performed               Past Medical History:  Diagnosis Date   Depression    GERD (gastroesophageal reflux disease)    Seasonal allergies    Past Surgical History:  Procedure Laterality Date   HERNIA REPAIR  10/16/2012   incarcerated   INGUINAL HERNIA REPAIR Left 10/16/2012   Procedure: HERNIA REPAIR INGUINAL ADULT;  Surgeon: Axel Filler, MD;  Location: [facility] OR;  Service: General;  Laterality: Left;   Patient Active Problem List   Diagnosis Date Noted   Poor dentition 06/23/2018   Tobacco use disorder 06/23/2018   Depression with anxiety 06/23/2018   Chronic bilateral low back pain with bilateral sciatica 06/23/2018   Porokeratosis 01/13/2018    REFERRING DIAG: M79.604,M79.605 (ICD-10-CM) - Bilateral leg pain   THERAPY DIAG:  Pain in left leg  Pain in right leg  Muscle weakness (generalized)  Rationale for Evaluation and Treatment Rehabilitation  PERTINENT HISTORY:  Sepehr Plyler is a 62 y.o. male here today for a acute visit.  He voices concerns about bilateral leg pain. Denies and trauma or falls. Patient has No headache, No chest pain, No abdominal pain - No Nausea, No new weakness tingling or numbness, No Cough - shortness of breath   No problems updated.  PRECAUTIONS: None   SUBJECTIVE:                                                                                                                                                                                       SUBJECTIVE STATEMENT: Arrives to PT session with no c/o leg symptoms, all cramping has resolved.  Main symptoms id R sided low back pain but he is unable to cite aggravation or relieving factors.  Pain comes and goes w/o warning, 6/10 in intensity  PAIN:  Are you having pain?  Yes: NPRS scale: "10+"/10 Pain location: R anterior LE Pain description: cramping Aggravating factors: prolonged standing Relieving factors: position  changes   OBJECTIVE: (objective measures completed at initial evaluation unless otherwise dated)  DIAGNOSTIC FINDINGS: none available   PATIENT SURVEYS:  LEFS 24/80 (30% functional)       MUSCLE LENGTH: Hamstrings: Right 70d deg; Left 80 deg   POSTURE: No Significant postural limitations   PALPATION: Taught bands in B gastrocs and hamstring groups   LOWER EXTREMITY ROM:   A/PROM Right eval Left eval  Hip flexion [facility]  Hip extension      Hip abduction      Hip adduction      Hip internal rotation      Hip external rotation      Knee flexion 150d 150d  Knee extension 0d 0d  Ankle dorsiflexion 8/15d 4/15d  Ankle plantarflexion [facility] WFL  Ankle inversion [facility] WFL  Ankle eversion WFL WFL   (Blank rows = not tested)   LOWER EXTREMITY MMT:   MMT Right eval Left eval  Hip flexion 4 4  Hip extension 4 4  Hip abduction 4 4  Hip adduction      Hip internal rotation      Hip external rotation      Knee flexion 4 4  Knee extension 4 4  Ankle dorsiflexion 4 4  Ankle plantarflexion 4- 4-  Ankle inversion      Ankle eversion       (Blank rows = not tested)   LOWER EXTREMITY SPECIAL TESTS:  Deferred based on diffuse symptoms   FUNCTIONAL TESTS:  5 times sit to stand: 18s arms crossed   GAIT: Distance walked: 17ft x2 Assistive device utilized: None Level of assistance: Complete Independence Comments: antalgic     TREATMENT: [facility] Adult PT Treatment:                                                 DATE: 12/14/22 Therapeutic Exercise: Nustep L4 8 min Seated hamstring stretch 30s x2 B Supine QL stretch 30s x2 B Curl ups L1 15x Bridge 15x Bridge with ball 15x Supine march 2# 15/15 POE 2 min Supine hip fallouts BluTB 15x B, 15/15 unilaterally PF against wall 15x B Seated PF stretch with towel 30s x2   [facility] Adult PT Treatment:                                                DATE: 12/02/22 Therapeutic Exercise: Nustep L4 8 min Seated hamstring stretch 30s x2 B Supine SLR 2# 2x15 Bil SAQs 2# 2x15 Bil PKB 30s x2 B Supine hip fallouts GTB 15x B, 15/15 unilaterally PF against wall 15x B Seated PF stretch with towel 30s x2  [facility] Adult PT Treatment:                                                DATE: 11/24/2022 Therapeutic Exercise: NuStep lvl 4 UE/LE x 4 min while taking subjective Seated hamstring stretch 2x30" Quad sets x 10 - 5" hold Supine SLR 2x10  Seated clamshell 2x15 GTB Slant board gastoc stretch 2x30" Slant board soleus stretch 2x30"  Mini squat B UE 2x10  Standing hip abd/ext 2x10 each Standing gastroc stretch x 30" each   PATIENT EDUCATION:  Education details: Continue HEP Person educated: Patient Education method: Explanation Education comprehension: verbalized understanding and needs further education   HOME EXERCISE PROGRAM: Access Code: [email or web address] URL: [email or web address] Date: 11/24/2022 Prepared by: Edwinna Areola  Exercises - Sit to Stand with Arms Crossed  - 2 x daily - 5 x weekly - 1 sets - 5 reps - Standing Heel Raises  - 2 x daily - 5 x weekly - 2 sets - 10 reps - Seated Hamstring Stretch  - 1 x daily - 7 x weekly - 2 reps - 30 sec hold - Gastroc Stretch on Wall  - 1 x daily - 7 x weekly - 2 reps - 30 sec hold   ASSESSMENT:   CLINICAL IMPRESSION: Today's session focused on low back and lumbar symptoms adding stretching and core strengthening exercises.  Addressed progress towards goals.   Increased resistance and difficulty as noted   OBJECTIVE IMPAIRMENTS: decreased activity tolerance, decreased endurance, decreased knowledge of condition, difficulty walking, decreased ROM, increased muscle spasms, and pain.    ACTIVITY LIMITATIONS: standing and squatting   PERSONAL FACTORS: Age, Past/current experiences, and Time since onset of injury/illness/exacerbation are also affecting patient's functional outcome.    GOALS: Goals reviewed with patient? No   SHORT TERM GOALS=LONG TERM GOALS: Target date: 12/15/2022   Patient to demonstrate independence in HEP  Baseline: [id number]; 12/14/22 Reports compliance Goal status: Met   2.  Increase LEFS score to 32/80  Baseline: 24/80 Goal status: INITIAL   3.  Decrease 5s STS time to 15s arms crossed Baseline: 18s; 12/13/21 10s arms crossed  Goal status: Met   4.  Increase global BLE strength to 4+/5 Baseline:  MMT Right eval Left eval  Hip flexion 4 4  Hip extension 4 4  Hip abduction 4 4  Hip adduction      Hip internal rotation      Hip external rotation      Knee flexion 4 4  Knee extension 4 4  Ankle dorsiflexion 4 4  Ankle plantarflexion 4- 4-    Goal status: INITIAL   PLAN:   PT FREQUENCY: 2x/week   PT DURATION: 4 weeks   PLANNED INTERVENTIONS: Therapeutic exercises, Therapeutic activity, Neuromuscular re-education, Balance training, Gait training, Patient/Family education, Self Care, Joint mobilization, Stair training, Dry Needling, Manual therapy, and Re-evaluation   PLAN FOR NEXT SESSION: HEP review and update, aerobic work, stretching and flexibility tasks, strengthening   Hildred Laser, PT 12/14/2022, 1:00 PM

## 2022-12-14 ENCOUNTER — Ambulatory Visit: Payer: Medicaid Other | Attending: Primary Care

## 2022-12-14 DIAGNOSIS — M6281 Muscle weakness (generalized): Secondary | ICD-10-CM

## 2022-12-14 DIAGNOSIS — M79604 Pain in right leg: Secondary | ICD-10-CM | POA: Insufficient documentation

## 2022-12-14 DIAGNOSIS — M79605 Pain in left leg: Secondary | ICD-10-CM

## 2022-12-15 ENCOUNTER — Encounter: Payer: Self-pay | Admitting: Acute Care

## 2022-12-15 ENCOUNTER — Ambulatory Visit (INDEPENDENT_AMBULATORY_CARE_PROVIDER_SITE_OTHER): Payer: Medicaid Other | Admitting: Acute Care

## 2022-12-15 DIAGNOSIS — F17210 Nicotine dependence, cigarettes, uncomplicated: Secondary | ICD-10-CM | POA: Diagnosis not present

## 2022-12-15 NOTE — Patient Instructions (Signed)
Thank you for participating in the [place] [facility] Scre[facility]. It was our pleasure to meet you today. We will call you with the results of your scan within the next few days. Your scan will be assigned a Lung RADS category score by the physicians reading the scans.  This Lung RADS score determines follow up scanning.  See below for description of categories, and follow up screening recommendations. We will be in touch to schedule your follow up screening annually or based on recommendations of our providers. We will fax a copy of your scan results to your Primary Care Physician, or the physician who referred you to the program, to ensure they have the results. Please call the office if you have any questions or concerns regarding your scanning experience or results.  Our office number is [phone]. Please speak with Denise Phelps, RN. , or  Denise Buckner RN, They are  our Lung Cancer Screening RN.'s If They are unavailable when you call, Please leave a message on the voice mail. We will return your call at our earliest convenience.This voice mail is monitored several times a day.  Remember, if your scan is normal, we will scan you annually as long as you continue to meet the criteria for the program. (Age 50-80, Current smoker or smoker who has quit within the last 15 years). If you are a smoker, remember, quitting is the single most powerful action that you can take to decrease your risk of lung cancer and other pulmonary, breathing related problems. We know quitting is hard, and we are here to help.  Please let us know if there is anything we can do to help you meet your goal of quitting. If you are a former smoker, congratulations. We are proud of you! Remain smoke free! Remember you can refer friends or family members through the number above.  We will screen them to make sure they meet criteria for the program. Thank you for helping us take better care of you by  participating in Lung Screening.  You can receive free nicotine replacement therapy ( patches, gum or mints) by calling 1-800-QUIT NOW. Please call so we can get you on the path to becoming  a non-smoker. I know it is hard, but you can do this!  Lung RADS Categories:  Lung RADS 1: no nodules or definitely non-concerning nodules.  Recommendation is for a repeat annual scan in 12 months.  Lung RADS 2:  nodules that are non-concerning in appearance and behavior with a very low likelihood of becoming an active cancer. Recommendation is for a repeat annual scan in 12 months.  Lung RADS 3: nodules that are probably non-concerning , includes nodules with a low likelihood of becoming an active cancer.  Recommendation is for a 6-month repeat screening scan. Often noted after an upper respiratory illness. We will be in touch to make sure you have no questions, and to schedule your 6-month scan.  Lung RADS 4 A: nodules with concerning findings, recommendation is most often for a follow up scan in 3 months or additional testing based on our provider's assessment of the scan. We will be in touch to make sure you have no questions and to schedule the recommended 3 month follow up scan.  Lung RADS 4 B:  indicates findings that are concerning. We will be in touch with you to schedule additional diagnostic testing based on our provider's  assessment of the scan.  Other options for assistance in smoking cessation (   As covered by your insurance benefits)  Hypnosis for smoking cessation  [employer] [phone]  Acupuncture for smoking cessation  [facility] [phone]   

## 2022-12-15 NOTE — Progress Notes (Signed)
Virtual Visit via Telephone Note  I connected with Salah Tinklepaugh Soutar on 12/15/22 at 10:00 AM EDT by telephone and verified that I am speaking with the correct person using two identifiers.  Location: Patient:  At home Provider:  [address] [place], [place], [address]    I discussed the limitations, risks, security and privacy concerns of performing an evaluation and management service by telephone and the availability of in person appointments. I also discussed with the patient that there may be a patient responsible charge related to this service. The patient expressed understanding and agreed to proceed.   Shared Decision Making Visit Lung Cancer Screening Program [phone])   Eligibility: Age 18 y.o. Pack Years Smoking History Calculation 44 year pack history (# packs/per year x # years smoked) Recent History of coughing up blood  no Unexplained weight loss? no ( >Than 15 pounds within the last 6 months ) Prior History Lung / other cancer no (Diagnosis within the last 5 years already requiring surveillance chest CT Scans). Smoking Status Current Smoker Former Smokers: Years since quit:  NA  Quit Date:  NA  Visit Components: Discussion included one or more decision making aids. yes Discussion included risk/benefits of screening. yes Discussion included potential follow up diagnostic testing for abnormal scans. yes Discussion included meaning and risk of over diagnosis. yes Discussion included meaning and risk of False Positives. yes Discussion included meaning of total radiation exposure. yes  Counseling Included: Importance of adherence to annual lung cancer LDCT screening. yes Impact of comorbidities on ability to participate in the program. yes Ability and willingness to under diagnostic treatment. yes  Smoking Cessation Counseling: Current Smokers:  Discussed importance of smoking cessation. yes Information about tobacco cessation classes and interventions  provided to patient. yes Patient provided with "ticket" for LDCT Scan. yes Symptomatic Patient. no  Counseling NA Diagnosis Code: Tobacco Use Z72.0 Asymptomatic Patient yes  Counseling (Intermediate counseling: > three minutes counseling) [id number] Former Smokers:  Discussed the importance of maintaining cigarette abstinence. yes Diagnosis Code: Personal History of Nicotine Dependence. V03.500 Information about tobacco cessation classes and interventions provided to patient. Yes Patient provided with "ticket" for LDCT Scan. yes Written Order for Lung Cancer Screening with LDCT placed in Epic. Yes (CT Chest Lung Cancer Screening Low Dose W/O CM) [id number] Z12.2-Screening of respiratory organs Z87.891-Personal history of nicotine dependence  I have spent 25 minutes of face to face/ virtual visit   time with  Mr. Santalucia discussing the risks and benefits of lung cancer screening. We viewed / discussed a power point together that explained in detail the above noted topics. We paused at intervals to allow for questions to be asked and answered to ensure understanding.We discussed that the single most powerful action that he can take to decrease his risk of developing lung cancer is to quit smoking. We discussed whether or not he is ready to commit to setting a quit date. We discussed options for tools to aid in quitting smoking including nicotine replacement therapy, non-nicotine medications, support groups, Quit Smart classes, and behavior modification. We discussed that often times setting smaller, more achievable goals, such as eliminating 1 cigarette a day for a week and then 2 cigarettes a day for a week can be helpful in slowly decreasing the number of cigarettes smoked. This allows for a sense of accomplishment as well as providing a clinical benefit. I provided  him  with smoking cessation  information  with contact information for community resources, classes, free  nicotine replacement therapy, and  access to mobile apps, text messaging, and on-line smoking cessation help. I have also provided  him  the office contact information in the event he needs to contact me, or the screening staff. We discussed the time and location of the scan, and that either Abigail Miyamoto RN, Karlton Lemon, RN  or I will call / send a letter with the results within 24-72 hours of receiving them. The patient verbalized understanding of all of  the above and had no further questions upon leaving the office. They have my contact information in the event they have any further questions.  I spent 3 minutes counseling on smoking cessation and the health risks of continued tobacco abuse.  I explained to the patient that there has been a high incidence of coronary artery disease noted on these exams. I explained that this is a non-gated exam therefore degree or severity cannot be determined. This patient is not on statin therapy. I have asked the patient to follow-up with their PCP regarding any incidental finding of coronary artery disease and management with diet or medication as their PCP  feels is clinically indicated. The patient verbalized understanding of the above and had no further questions upon completion of the visit.      Bevelyn Ngo, NP 12/15/2022

## 2022-12-16 ENCOUNTER — Ambulatory Visit
Admission: RE | Admit: 2022-12-16 | Discharge: 2022-12-16 | Disposition: A | Discharge status: Home / Self Care | Payer: Medicaid Other | Source: Ambulatory Visit | Attending: Acute Care | Admitting: Acute Care

## 2022-12-16 ENCOUNTER — Ambulatory Visit: Payer: Medicaid Other

## 2022-12-16 DIAGNOSIS — M79605 Pain in left leg: Secondary | ICD-10-CM

## 2022-12-16 DIAGNOSIS — Z87891 Personal history of nicotine dependence: Secondary | ICD-10-CM

## 2022-12-16 DIAGNOSIS — F17210 Nicotine dependence, cigarettes, uncomplicated: Secondary | ICD-10-CM

## 2022-12-16 DIAGNOSIS — M6281 Muscle weakness (generalized): Secondary | ICD-10-CM

## 2022-12-16 DIAGNOSIS — M79604 Pain in right leg: Secondary | ICD-10-CM

## 2022-12-16 NOTE — Therapy (Addendum)
OUTPATIENT PHYSICAL THERAPY TREATMENT NOTE/DC SUMMARY   Patient Name: Logan Bailey MRN: [id number] DOB:07/22/1961, 62 y.o., male Today's Date: 12/16/2022  PCP: Grayce Sessions, NP  REFERRING PROVIDER: Grayce Sessions, NP  PHYSICAL THERAPY DISCHARGE SUMMARY  Visits from Start of Care: 5  Current functional level related to goals / functional outcomes: Goals met   Remaining deficits: Minimal foot cramping   Education / Equipment: HEP   Patient agrees to discharge. Patient goals were met. Patient is being discharged due to being pleased with the current functional level.  END OF SESSION:   PT End of Session - 12/16/22 1141     Visit Number 5    Number of Visits 8    Date for PT Re-Evaluation 01/12/23    Authorization Type [place] [facility]    PT Start Time 1140   patient late for session   PT Stop Time 1210    PT Time Calculation (min) 30 min    Activity Tolerance Patient tolerated treatment well    Behavior During Therapy [facility] for tasks assessed/performed               Past Medical History:  Diagnosis Date   Depression    GERD (gastroesophageal reflux disease)    Seasonal allergies    Past Surgical History:  Procedure Laterality Date   HERNIA REPAIR  10/16/2012   incarcerated   INGUINAL HERNIA REPAIR Left 10/16/2012   Procedure: HERNIA REPAIR INGUINAL ADULT;  Surgeon: Axel Filler, MD;  Location: [facility] OR;  Service: General;  Laterality: Left;   Patient Active Problem List   Diagnosis Date Noted   Poor dentition 06/23/2018   Tobacco use disorder 06/23/2018   Depression with anxiety 06/23/2018   Chronic bilateral low back pain with bilateral sciatica 06/23/2018   Porokeratosis 01/13/2018    REFERRING DIAG: M79.604,M79.605 (ICD-10-CM) - Bilateral leg pain   THERAPY DIAG:  Pain in left leg  Pain in right leg  Muscle weakness (generalized)  Rationale for Evaluation and Treatment Rehabilitation  PERTINENT HISTORY:  Logan Bailey is a 62 y.o. male  here today for a acute visit.  He voices concerns about bilateral leg pain. Denies and trauma or falls. Patient has No headache, No chest pain, No abdominal pain - No Nausea, No new weakness tingling or numbness, No Cough - shortness of breath   No problems updated.  PRECAUTIONS: None   SUBJECTIVE:                                                                                                                                                                                      SUBJECTIVE STATEMENT: R low back symptoms  a bit better.  Has not experienced any leg cramping lately.  PAIN:  Are you having pain?  Yes: NPRS scale: 1/10 Pain location: R anterior LE Pain description: cramping Aggravating factors: prolonged standing Relieving factors: position changes   OBJECTIVE: (objective measures completed at initial evaluation unless otherwise dated)  DIAGNOSTIC FINDINGS: none available   PATIENT SURVEYS:  LEFS 24/80 (30% functional)       MUSCLE LENGTH: Hamstrings: Right 70d deg; Left 80 deg   POSTURE: No Significant postural limitations   PALPATION: Taught bands in B gastrocs and hamstring groups   LOWER EXTREMITY ROM:   A/PROM Right eval Left eval  Hip flexion [facility]  Hip extension      Hip abduction      Hip adduction      Hip internal rotation      Hip external rotation      Knee flexion 150d 150d  Knee extension 0d 0d  Ankle dorsiflexion 8/15d 4/15d  Ankle plantarflexion [facility] WFL  Ankle inversion [facility] WFL  Ankle eversion WFL WFL   (Blank rows = not tested)   LOWER EXTREMITY MMT:   MMT Right eval Left eval  Hip flexion 4 4  Hip extension 4 4  Hip abduction 4 4  Hip adduction      Hip internal rotation      Hip external rotation      Knee flexion 4 4  Knee extension 4 4  Ankle dorsiflexion 4 4  Ankle plantarflexion 4- 4-  Ankle inversion      Ankle eversion       (Blank rows = not tested)   LOWER EXTREMITY SPECIAL TESTS:  Deferred based on  diffuse symptoms   FUNCTIONAL TESTS:  5 times sit to stand: 18s arms crossed   GAIT: Distance walked: 24ft x2 Assistive device utilized: None Level of assistance: Complete Independence Comments: antalgic     TREATMENT: [facility] Adult PT Treatment:                                                DATE: 12/16/22 Therapeutic Exercise: Nustep L5 8 min Seated hamstring stretch 30s x2 B Supine QL stretch 30s x2 B Curl ups L2 15x (arms crossed) [employer] with 5# ball 15x Supine march 2# 15/15 POE 2 min Supine hip fallouts BluTB 15x B, 15/15 unilaterally PF against wall 10x B, 10x R unable to perform on L  [facility] Adult PT Treatment:                                                DATE: 12/14/22 Therapeutic Exercise: Nustep L4 8 min Seated hamstring stretch 30s x2 B Supine QL stretch 30s x2 B Curl ups L1 15x Bridge 15x Bridge with ball 15x Supine march 2# 15/15 POE 2 min Supine hip fallouts BluTB 15x B, 15/15 unilaterally PF against wall 15x B Seated PF stretch with towel 30s x2   [facility] Adult PT Treatment:  DATE: 12/02/22 Therapeutic Exercise: Nustep L4 8 min Seated hamstring stretch 30s x2 B Supine SLR 2# 2x15 Bil SAQs 2# 2x15 Bil PKB 30s x2 B Supine hip fallouts GTB 15x B, 15/15 unilaterally PF against wall 15x B Seated PF stretch with towel 30s x2  [facility] Adult PT Treatment:                                                DATE: 11/24/2022 Therapeutic Exercise: NuStep lvl 4 UE/LE x 4 min while taking subjective Seated hamstring stretch 2x30" Quad sets x 10 - 5" hold Supine SLR 2x10  Seated clamshell 2x15 GTB Slant board gastoc stretch 2x30" Slant board soleus stretch 2x30"  Mini squat B UE 2x10  Standing hip abd/ext 2x10 each Standing gastroc stretch x 30" each   PATIENT EDUCATION:  Education details: Continue HEP Person educated: Patient Education method: Explanation Education comprehension: verbalized understanding and  needs further education   HOME EXERCISE PROGRAM: Access Code: [email or web address] URL: [email or web address] Date: 11/24/2022 Prepared by: Edwinna Areola  Exercises - Sit to Stand with Arms Crossed  - 2 x daily - 5 x weekly - 1 sets - 5 reps - Standing Heel Raises  - 2 x daily - 5 x weekly - 2 sets - 10 reps - Seated Hamstring Stretch  - 1 x daily - 7 x weekly - 2 reps - 30 sec hold - Gastroc Stretch on Wall  - 1 x daily - 7 x weekly - 2 reps - 30 sec hold   ASSESSMENT:   CLINICAL IMPRESSION: Focus today was R low back symptoms as B LE symptoms have essentially resolved.  Increased resistance and difficulty as noted to challenge strength and ROM/flexibility.  R single leg heel raises painful in forefoot, unable to perform on L due to weakness   OBJECTIVE IMPAIRMENTS: decreased activity tolerance, decreased endurance, decreased knowledge of condition, difficulty walking, decreased ROM, increased muscle spasms, and pain.    ACTIVITY LIMITATIONS: standing and squatting   PERSONAL FACTORS: Age, Past/current experiences, and Time since onset of injury/illness/exacerbation are also affecting patient's functional outcome.    GOALS: Goals reviewed with patient? No   SHORT TERM GOALS=LONG TERM GOALS: Target date: 12/15/2022   Patient to demonstrate independence in HEP  Baseline: [id number]; 12/14/22 Reports compliance Goal status: Met   2.  Increase LEFS score to 32/80  Baseline: 24/80 Goal status: INITIAL   3.  Decrease 5s STS time to 15s arms crossed Baseline: 18s; 12/13/21 10s arms crossed  Goal status: Met   4.  Increase global BLE strength to 4+/5 Baseline:  MMT Right eval Left eval  Hip flexion 4 4  Hip extension 4 4  Hip abduction 4 4  Hip adduction      Hip internal rotation      Hip external rotation      Knee flexion 4 4  Knee extension 4 4  Ankle dorsiflexion 4 4  Ankle plantarflexion 4- 4-    Goal status: INITIAL   PLAN:   PT FREQUENCY: 2x/week   PT  DURATION: 4 weeks   PLANNED INTERVENTIONS: Therapeutic exercises, Therapeutic activity, Neuromuscular re-education, Balance training, Gait training, Patient/Family education, Self Care, Joint mobilization, Stair training, Dry Needling, Manual therapy, and Re-evaluation   PLAN FOR NEXT SESSION: HEP review and update, aerobic work, stretching and flexibility tasks, strengthening   [facility]  Rondel Baton, PT 12/16/2022, 12:15 PM

## 2022-12-20 NOTE — Therapy (Deleted)
OUTPATIENT PHYSICAL THERAPY TREATMENT NOTE   Patient Name: Logan Bailey MRN: [id number] DOB:03-Jan-1961, 62 y.o., male Today's Date: 12/20/2022  PCP: Grayce Sessions, NP  REFERRING PROVIDER: Grayce Sessions, NP   END OF SESSION:       Past Medical History:  Diagnosis Date   Depression    GERD (gastroesophageal reflux disease)    Seasonal allergies    Past Surgical History:  Procedure Laterality Date   HERNIA REPAIR  10/16/2012   incarcerated   INGUINAL HERNIA REPAIR Left 10/16/2012   Procedure: HERNIA REPAIR INGUINAL ADULT;  Surgeon: Axel Filler, MD;  Location: [facility] OR;  Service: General;  Laterality: Left;   Patient Active Problem List   Diagnosis Date Noted   Poor dentition 06/23/2018   Tobacco use disorder 06/23/2018   Depression with anxiety 06/23/2018   Chronic bilateral low back pain with bilateral sciatica 06/23/2018   Porokeratosis 01/13/2018    REFERRING DIAG: M79.604,M79.605 (ICD-10-CM) - Bilateral leg pain   THERAPY DIAG:  No diagnosis found.  Rationale for Evaluation and Treatment Rehabilitation  PERTINENT HISTORY:  Logan Bailey is a 62 y.o. male here today for a acute visit.  He voices concerns about bilateral leg pain. Denies and trauma or falls. Patient has No headache, No chest pain, No abdominal pain - No Nausea, No new weakness tingling or numbness, No Cough - shortness of breath   No problems updated.  PRECAUTIONS: None   SUBJECTIVE:                                                                                                                                                                                      SUBJECTIVE STATEMENT: R low back symptoms a bit better.  Has not experienced any leg cramping lately.  PAIN:  Are you having pain?  Yes: NPRS scale: 1/10 Pain location: R anterior LE Pain description: cramping Aggravating factors: prolonged standing Relieving factors: position changes   OBJECTIVE: (objective  measures completed at initial evaluation unless otherwise dated)  DIAGNOSTIC FINDINGS: none available   PATIENT SURVEYS:  LEFS 24/80 (30% functional)       MUSCLE LENGTH: Hamstrings: Right 70d deg; Left 80 deg   POSTURE: No Significant postural limitations   PALPATION: Taught bands in B gastrocs and hamstring groups   LOWER EXTREMITY ROM:   A/PROM Right eval Left eval  Hip flexion [facility]  Hip extension      Hip abduction      Hip adduction      Hip internal rotation      Hip external rotation      Knee flexion 150d 150d  Knee extension 0d 0d  Ankle dorsiflexion 8/15d 4/15d  Ankle plantarflexion [facility] WFL  Ankle inversion [facility] WFL  Ankle eversion WFL WFL   (Blank rows = not tested)   LOWER EXTREMITY MMT:   MMT Right eval Left eval  Hip flexion 4 4  Hip extension 4 4  Hip abduction 4 4  Hip adduction      Hip internal rotation      Hip external rotation      Knee flexion 4 4  Knee extension 4 4  Ankle dorsiflexion 4 4  Ankle plantarflexion 4- 4-  Ankle inversion      Ankle eversion       (Blank rows = not tested)   LOWER EXTREMITY SPECIAL TESTS:  Deferred based on diffuse symptoms   FUNCTIONAL TESTS:  5 times sit to stand: 18s arms crossed   GAIT: Distance walked: 22ft x2 Assistive device utilized: None Level of assistance: Complete Independence Comments: antalgic     TREATMENT: [facility] Adult PT Treatment:                                                DATE: 12/16/22 Therapeutic Exercise: Nustep L5 8 min Seated hamstring stretch 30s x2 B Supine QL stretch 30s x2 B Curl ups L2 15x (arms crossed) [employer] with 5# ball 15x Supine march 2# 15/15 POE 2 min Supine hip fallouts BluTB 15x B, 15/15 unilaterally PF against wall 10x B, 10x R unable to perform on L  [facility] Adult PT Treatment:                                                DATE: 12/14/22 Therapeutic Exercise: Nustep L4 8 min Seated hamstring stretch 30s x2 B Supine QL stretch 30s  x2 B Curl ups L1 15x Bridge 15x Bridge with ball 15x Supine march 2# 15/15 POE 2 min Supine hip fallouts BluTB 15x B, 15/15 unilaterally PF against wall 15x B Seated PF stretch with towel 30s x2   [facility] Adult PT Treatment:                                                DATE: 12/02/22 Therapeutic Exercise: Nustep L4 8 min Seated hamstring stretch 30s x2 B Supine SLR 2# 2x15 Bil SAQs 2# 2x15 Bil PKB 30s x2 B Supine hip fallouts GTB 15x B, 15/15 unilaterally PF against wall 15x B Seated PF stretch with towel 30s x2  [facility] Adult PT Treatment:                                                DATE: 11/24/2022 Therapeutic Exercise: NuStep lvl 4 UE/LE x 4 min while taking subjective Seated hamstring stretch 2x30" Quad sets x 10 - 5" hold Supine SLR 2x10  Seated clamshell 2x15 GTB Slant board gastoc stretch 2x30" Slant board soleus stretch 2x30"  Mini squat B UE 2x10  Standing hip abd/ext 2x10 each Standing gastroc stretch x 30" each  PATIENT EDUCATION:  Education details: Continue HEP Person educated: Patient Education method: Explanation Education comprehension: verbalized understanding and needs further education   HOME EXERCISE PROGRAM: Access Code: [email or web address] URL: [email or web address] Date: 11/24/2022 Prepared by: Edwinna Areola  Exercises - Sit to Stand with Arms Crossed  - 2 x daily - 5 x weekly - 1 sets - 5 reps - Standing Heel Raises  - 2 x daily - 5 x weekly - 2 sets - 10 reps - Seated Hamstring Stretch  - 1 x daily - 7 x weekly - 2 reps - 30 sec hold - Gastroc Stretch on Wall  - 1 x daily - 7 x weekly - 2 reps - 30 sec hold   ASSESSMENT:   CLINICAL IMPRESSION: Focus today was R low back symptoms as B LE symptoms have essentially resolved.  Increased resistance and difficulty as noted to challenge strength and ROM/flexibility.  R single leg heel raises painful in forefoot, unable to perform on L due to weakness   OBJECTIVE IMPAIRMENTS: decreased  activity tolerance, decreased endurance, decreased knowledge of condition, difficulty walking, decreased ROM, increased muscle spasms, and pain.    ACTIVITY LIMITATIONS: standing and squatting   PERSONAL FACTORS: Age, Past/current experiences, and Time since onset of injury/illness/exacerbation are also affecting patient's functional outcome.    GOALS: Goals reviewed with patient? No   SHORT TERM GOALS=LONG TERM GOALS: Target date: 12/15/2022   Patient to demonstrate independence in HEP  Baseline: [id number]; 12/14/22 Reports compliance Goal status: Met   2.  Increase LEFS score to 32/80  Baseline: 24/80 Goal status: INITIAL   3.  Decrease 5s STS time to 15s arms crossed Baseline: 18s; 12/13/21 10s arms crossed  Goal status: Met   4.  Increase global BLE strength to 4+/5 Baseline:  MMT Right eval Left eval  Hip flexion 4 4  Hip extension 4 4  Hip abduction 4 4  Hip adduction      Hip internal rotation      Hip external rotation      Knee flexion 4 4  Knee extension 4 4  Ankle dorsiflexion 4 4  Ankle plantarflexion 4- 4-    Goal status: INITIAL   PLAN:   PT FREQUENCY: 2x/week   PT DURATION: 4 weeks   PLANNED INTERVENTIONS: Therapeutic exercises, Therapeutic activity, Neuromuscular re-education, Balance training, Gait training, Patient/Family education, Self Care, Joint mobilization, Stair training, Dry Needling, Manual therapy, and Re-evaluation   PLAN FOR NEXT SESSION: HEP review and update, aerobic work, stretching and flexibility tasks, strengthening   Hildred Laser, PT 12/20/2022, 10:44 AM

## 2022-12-21 ENCOUNTER — Ambulatory Visit: Payer: Medicaid Other

## 2022-12-21 ENCOUNTER — Other Ambulatory Visit: Payer: Self-pay | Admitting: Acute Care

## 2022-12-21 DIAGNOSIS — Z87891 Personal history of nicotine dependence: Secondary | ICD-10-CM

## 2022-12-21 DIAGNOSIS — Z122 Encounter for screening for malignant neoplasm of respiratory organs: Secondary | ICD-10-CM

## 2022-12-21 DIAGNOSIS — R911 Solitary pulmonary nodule: Secondary | ICD-10-CM

## 2022-12-21 DIAGNOSIS — F17210 Nicotine dependence, cigarettes, uncomplicated: Secondary | ICD-10-CM

## 2022-12-22 NOTE — Therapy (Deleted)
OUTPATIENT PHYSICAL THERAPY TREATMENT NOTE   Patient Name: Logan Bailey MRN: [id number] DOB:06/20/1961, 61 y.o., male Today's Date: 12/20/2022  PCP: Edwards, Michelle P, NP  REFERRING PROVIDER: Edwards, Michelle P, NP   END OF SESSION:       Past Medical History:  Diagnosis Date   Depression    GERD (gastroesophageal reflux disease)    Seasonal allergies    Past Surgical History:  Procedure Laterality Date   HERNIA REPAIR  10/16/2012   incarcerated   INGUINAL HERNIA REPAIR Left 10/16/2012   Procedure: HERNIA REPAIR INGUINAL ADULT;  Surgeon: Armando Ramirez, MD;  Location: [facility] OR;  Service: General;  Laterality: Left;   Patient Active Problem List   Diagnosis Date Noted   Poor dentition 06/23/2018   Tobacco use disorder 06/23/2018   Depression with anxiety 06/23/2018   Chronic bilateral low back pain with bilateral sciatica 06/23/2018   Porokeratosis 01/13/2018    REFERRING DIAG: M79.604,M79.605 (ICD-10-CM) - Bilateral leg pain   THERAPY DIAG:  No diagnosis found.  Rationale for Evaluation and Treatment Rehabilitation  PERTINENT HISTORY:  Jailon Turski is a 61 y.o. male here today for a acute visit.  He voices concerns about bilateral leg pain. Denies and trauma or falls. Patient has No headache, No chest pain, No abdominal pain - No Nausea, No new weakness tingling or numbness, No Cough - shortness of breath   No problems updated.  PRECAUTIONS: None   SUBJECTIVE:                                                                                                                                                                                      SUBJECTIVE STATEMENT: R low back symptoms a bit better.  Has not experienced any leg cramping lately.  PAIN:  Are you having pain?  Yes: NPRS scale: 1/10 Pain location: R anterior LE Pain description: cramping Aggravating factors: prolonged standing Relieving factors: position changes   OBJECTIVE: (objective  measures completed at initial evaluation unless otherwise dated)  DIAGNOSTIC FINDINGS: none available   PATIENT SURVEYS:  LEFS 24/80 (30% functional)       MUSCLE LENGTH: Hamstrings: Right 70d deg; Left 80 deg   POSTURE: No Significant postural limitations   PALPATION: Taught bands in B gastrocs and hamstring groups   LOWER EXTREMITY ROM:   A/PROM Right eval Left eval  Hip flexion WFL WFL  Hip extension      Hip abduction      Hip adduction      Hip internal rotation      Hip external rotation      Knee flexion 150d 150d  Knee extension 0d 0d    Ankle dorsiflexion 8/15d 4/15d  Ankle plantarflexion WFL WFL  Ankle inversion WFL WFL  Ankle eversion WFL WFL   (Blank rows = not tested)   LOWER EXTREMITY MMT:   MMT Right eval Left eval  Hip flexion 4 4  Hip extension 4 4  Hip abduction 4 4  Hip adduction      Hip internal rotation      Hip external rotation      Knee flexion 4 4  Knee extension 4 4  Ankle dorsiflexion 4 4  Ankle plantarflexion 4- 4-  Ankle inversion      Ankle eversion       (Blank rows = not tested)   LOWER EXTREMITY SPECIAL TESTS:  Deferred based on diffuse symptoms   FUNCTIONAL TESTS:  5 times sit to stand: 18s arms crossed   GAIT: Distance walked: 75ft x2 Assistive device utilized: None Level of assistance: Complete Independence Comments: antalgic     TREATMENT: [facility] Adult PT Treatment:                                                DATE: 12/16/22 Therapeutic Exercise: Nustep L5 8 min Seated hamstring stretch 30s x2 B Supine QL stretch 30s x2 B Curl ups L2 15x (arms crossed) Bridge 15x Bridge with 5# ball 15x Supine march 2# 15/15 POE 2 min Supine hip fallouts BluTB 15x B, 15/15 unilaterally PF against wall 10x B, 10x R unable to perform on L  [facility] Adult PT Treatment:                                                DATE: 12/14/22 Therapeutic Exercise: Nustep L4 8 min Seated hamstring stretch 30s x2 B Supine QL stretch 30s  x2 B Curl ups L1 15x Bridge 15x Bridge with ball 15x Supine march 2# 15/15 POE 2 min Supine hip fallouts BluTB 15x B, 15/15 unilaterally PF against wall 15x B Seated PF stretch with towel 30s x2   [facility] Adult PT Treatment:                                                DATE: 12/02/22 Therapeutic Exercise: Nustep L4 8 min Seated hamstring stretch 30s x2 B Supine SLR 2# 2x15 Bil SAQs 2# 2x15 Bil PKB 30s x2 B Supine hip fallouts GTB 15x B, 15/15 unilaterally PF against wall 15x B Seated PF stretch with towel 30s x2  [facility] Adult PT Treatment:                                                DATE: 11/24/2022 Therapeutic Exercise: NuStep lvl 4 UE/LE x 4 min while taking subjective Seated hamstring stretch 2x30" Quad sets x 10 - 5" hold Supine SLR 2x10  Seated clamshell 2x15 GTB Slant board gastoc stretch 2x30" Slant board soleus stretch 2x30"  Mini squat B UE 2x10  Standing hip abd/ext 2x10 each Standing gastroc stretch x 30" each     PATIENT EDUCATION:  Education details: Continue HEP Person educated: Patient Education method: Explanation Education comprehension: verbalized understanding and needs further education   HOME EXERCISE PROGRAM: Access Code: [email or web address] URL: [email or web address] Date: 11/24/2022 Prepared by: David Stroup  Exercises - Sit to Stand with Arms Crossed  - 2 x daily - 5 x weekly - 1 sets - 5 reps - Standing Heel Raises  - 2 x daily - 5 x weekly - 2 sets - 10 reps - Seated Hamstring Stretch  - 1 x daily - 7 x weekly - 2 reps - 30 sec hold - Gastroc Stretch on Wall  - 1 x daily - 7 x weekly - 2 reps - 30 sec hold   ASSESSMENT:   CLINICAL IMPRESSION: Focus today was R low back symptoms as B LE symptoms have essentially resolved.  Increased resistance and difficulty as noted to challenge strength and ROM/flexibility.  R single leg heel raises painful in forefoot, unable to perform on L due to weakness   OBJECTIVE IMPAIRMENTS: decreased  activity tolerance, decreased endurance, decreased knowledge of condition, difficulty walking, decreased ROM, increased muscle spasms, and pain.    ACTIVITY LIMITATIONS: standing and squatting   PERSONAL FACTORS: Age, Past/current experiences, and Time since onset of injury/illness/exacerbation are also affecting patient's functional outcome.    GOALS: Goals reviewed with patient? No   SHORT TERM GOALS=LONG TERM GOALS: Target date: 12/15/2022   Patient to demonstrate independence in HEP  Baseline: [id number]; 12/14/22 Reports compliance Goal status: Met   2.  Increase LEFS score to 32/80  Baseline: 24/80 Goal status: INITIAL   3.  Decrease 5s STS time to 15s arms crossed Baseline: 18s; 12/13/21 10s arms crossed  Goal status: Met   4.  Increase global BLE strength to 4+/5 Baseline:  MMT Right eval Left eval  Hip flexion 4 4  Hip extension 4 4  Hip abduction 4 4  Hip adduction      Hip internal rotation      Hip external rotation      Knee flexion 4 4  Knee extension 4 4  Ankle dorsiflexion 4 4  Ankle plantarflexion 4- 4-    Goal status: INITIAL   PLAN:   PT FREQUENCY: 2x/week   PT DURATION: 4 weeks   PLANNED INTERVENTIONS: Therapeutic exercises, Therapeutic activity, Neuromuscular re-education, Balance training, Gait training, Patient/Family education, Self Care, Joint mobilization, Stair training, Dry Needling, Manual therapy, and Re-evaluation   PLAN FOR NEXT SESSION: HEP review and update, aerobic work, stretching and flexibility tasks, strengthening   Ashok Sawaya M Uday Jantz, PT 12/20/2022, 10:44 AM     

## 2022-12-23 ENCOUNTER — Telehealth: Payer: Self-pay

## 2022-12-23 ENCOUNTER — Ambulatory Visit: Payer: Medicaid Other

## 2022-12-23 NOTE — Telephone Encounter (Signed)
TC to patient due to second missed visit.  Phone rings twice then reverts back to dial tone over multiple attempts.

## 2022-12-24 NOTE — Therapy (Deleted)
OUTPATIENT PHYSICAL THERAPY TREATMENT NOTE   Patient Name: Logan Bailey MRN: [id number] DOB:01/21/1961, 62 y.o., male Today's Date: 12/24/2022  PCP: Edwards, Michelle P, NP  REFERRING PROVIDER: Edwards, Michelle P, NP   END OF SESSION:       Past Medical History:  Diagnosis Date   Depression    GERD (gastroesophageal reflux disease)    Seasonal allergies    Past Surgical History:  Procedure Laterality Date   HERNIA REPAIR  10/16/2012   incarcerated   INGUINAL HERNIA REPAIR Left 10/16/2012   Procedure: HERNIA REPAIR INGUINAL ADULT;  Surgeon: Armando Ramirez, MD;  Location: [facility] OR;  Service: General;  Laterality: Left;   Patient Active Problem List   Diagnosis Date Noted   Poor dentition 06/23/2018   Tobacco use disorder 06/23/2018   Depression with anxiety 06/23/2018   Chronic bilateral low back pain with bilateral sciatica 06/23/2018   Porokeratosis 01/13/2018    REFERRING DIAG: M79.604,M79.605 (ICD-10-CM) - Bilateral leg pain   THERAPY DIAG:  No diagnosis found.  Rationale for Evaluation and Treatment Rehabilitation  PERTINENT HISTORY:  Logan Bailey is a 62 y.o. male here today for a acute visit.  He voices concerns about bilateral leg pain. Denies and trauma or falls. Patient has No headache, No chest pain, No abdominal pain - No Nausea, No new weakness tingling or numbness, No Cough - shortness of breath   No problems updated.  PRECAUTIONS: None   SUBJECTIVE:                                                                                                                                                                                      SUBJECTIVE STATEMENT: R low back symptoms a bit better.  Has not experienced any leg cramping lately.  PAIN:  Are you having pain?  Yes: NPRS scale: 1/10 Pain location: R anterior LE Pain description: cramping Aggravating factors: prolonged standing Relieving factors: position changes   OBJECTIVE: (objective  measures completed at initial evaluation unless otherwise dated)  DIAGNOSTIC FINDINGS: none available   PATIENT SURVEYS:  LEFS 24/80 (30% functional)       MUSCLE LENGTH: Hamstrings: Right 70d deg; Left 80 deg   POSTURE: No Significant postural limitations   PALPATION: Taught bands in B gastrocs and hamstring groups   LOWER EXTREMITY ROM:   A/PROM Right eval Left eval  Hip flexion WFL WFL  Hip extension      Hip abduction      Hip adduction      Hip internal rotation      Hip external rotation      Knee flexion 150d 150d  Knee extension 0d 0d    Ankle dorsiflexion 8/15d 4/15d  Ankle plantarflexion WFL WFL  Ankle inversion WFL WFL  Ankle eversion WFL WFL   (Blank rows = not tested)   LOWER EXTREMITY MMT:   MMT Right eval Left eval  Hip flexion 4 4  Hip extension 4 4  Hip abduction 4 4  Hip adduction      Hip internal rotation      Hip external rotation      Knee flexion 4 4  Knee extension 4 4  Ankle dorsiflexion 4 4  Ankle plantarflexion 4- 4-  Ankle inversion      Ankle eversion       (Blank rows = not tested)   LOWER EXTREMITY SPECIAL TESTS:  Deferred based on diffuse symptoms   FUNCTIONAL TESTS:  5 times sit to stand: 18s arms crossed   GAIT: Distance walked: 75ft x2 Assistive device utilized: None Level of assistance: Complete Independence Comments: antalgic     TREATMENT: [facility] Adult PT Treatment:                                                DATE: 12/16/22 Therapeutic Exercise: Nustep L5 8 min Seated hamstring stretch 30s x2 B Supine QL stretch 30s x2 B Curl ups L2 15x (arms crossed) Bridge 15x Bridge with 5# ball 15x Supine march 2# 15/15 POE 2 min Supine hip fallouts BluTB 15x B, 15/15 unilaterally PF against wall 10x B, 10x R unable to perform on L  [facility] Adult PT Treatment:                                                DATE: 12/14/22 Therapeutic Exercise: Nustep L4 8 min Seated hamstring stretch 30s x2 B Supine QL stretch 30s  x2 B Curl ups L1 15x Bridge 15x Bridge with ball 15x Supine march 2# 15/15 POE 2 min Supine hip fallouts BluTB 15x B, 15/15 unilaterally PF against wall 15x B Seated PF stretch with towel 30s x2   [facility] Adult PT Treatment:                                                DATE: 12/02/22 Therapeutic Exercise: Nustep L4 8 min Seated hamstring stretch 30s x2 B Supine SLR 2# 2x15 Bil SAQs 2# 2x15 Bil PKB 30s x2 B Supine hip fallouts GTB 15x B, 15/15 unilaterally PF against wall 15x B Seated PF stretch with towel 30s x2  [facility] Adult PT Treatment:                                                DATE: 11/24/2022 Therapeutic Exercise: NuStep lvl 4 UE/LE x 4 min while taking subjective Seated hamstring stretch 2x30" Quad sets x 10 - 5" hold Supine SLR 2x10  Seated clamshell 2x15 GTB Slant board gastoc stretch 2x30" Slant board soleus stretch 2x30"  Mini squat B UE 2x10  Standing hip abd/ext 2x10 each Standing gastroc stretch x 30" each     PATIENT EDUCATION:  Education details: Continue HEP Person educated: Patient Education method: Explanation Education comprehension: verbalized understanding and needs further education   HOME EXERCISE PROGRAM: Access Code: [email or web address] URL: [email or web address] Date: 11/24/2022 Prepared by: David Stroup  Exercises - Sit to Stand with Arms Crossed  - 2 x daily - 5 x weekly - 1 sets - 5 reps - Standing Heel Raises  - 2 x daily - 5 x weekly - 2 sets - 10 reps - Seated Hamstring Stretch  - 1 x daily - 7 x weekly - 2 reps - 30 sec hold - Gastroc Stretch on Wall  - 1 x daily - 7 x weekly - 2 reps - 30 sec hold   ASSESSMENT:   CLINICAL IMPRESSION: Focus today was R low back symptoms as B LE symptoms have essentially resolved.  Increased resistance and difficulty as noted to challenge strength and ROM/flexibility.  R single leg heel raises painful in forefoot, unable to perform on L due to weakness   OBJECTIVE IMPAIRMENTS: decreased  activity tolerance, decreased endurance, decreased knowledge of condition, difficulty walking, decreased ROM, increased muscle spasms, and pain.    ACTIVITY LIMITATIONS: standing and squatting   PERSONAL FACTORS: Age, Past/current experiences, and Time since onset of injury/illness/exacerbation are also affecting patient's functional outcome.    GOALS: Goals reviewed with patient? No   SHORT TERM GOALS=LONG TERM GOALS: Target date: 12/15/2022   Patient to demonstrate independence in HEP  Baseline: [id number]; 12/14/22 Reports compliance Goal status: Met   2.  Increase LEFS score to 32/80  Baseline: 24/80 Goal status: INITIAL   3.  Decrease 5s STS time to 15s arms crossed Baseline: 18s; 12/13/21 10s arms crossed  Goal status: Met   4.  Increase global BLE strength to 4+/5 Baseline:  MMT Right eval Left eval  Hip flexion 4 4  Hip extension 4 4  Hip abduction 4 4  Hip adduction      Hip internal rotation      Hip external rotation      Knee flexion 4 4  Knee extension 4 4  Ankle dorsiflexion 4 4  Ankle plantarflexion 4- 4-    Goal status: INITIAL   PLAN:   PT FREQUENCY: 2x/week   PT DURATION: 4 weeks   PLANNED INTERVENTIONS: Therapeutic exercises, Therapeutic activity, Neuromuscular re-education, Balance training, Gait training, Patient/Family education, Self Care, Joint mobilization, Stair training, Dry Needling, Manual therapy, and Re-evaluation   PLAN FOR NEXT SESSION: HEP review and update, aerobic work, stretching and flexibility tasks, strengthening   Fahim Kats M Clara Herbison, PT 12/24/2022, 11:59 AM     

## 2022-12-27 NOTE — Therapy (Deleted)
OUTPATIENT PHYSICAL THERAPY TREATMENT NOTE   Patient Name: Logan Bailey MRN: [id number] DOB:06-19-1961, 62 y.o., male Today's Date: 12/24/2022  PCP: Grayce Sessions, NP  REFERRING PROVIDER: Grayce Sessions, NP   END OF SESSION:       Past Medical History:  Diagnosis Date   Depression    GERD (gastroesophageal reflux disease)    Seasonal allergies    Past Surgical History:  Procedure Laterality Date   HERNIA REPAIR  10/16/2012   incarcerated   INGUINAL HERNIA REPAIR Left 10/16/2012   Procedure: HERNIA REPAIR INGUINAL ADULT;  Surgeon: Axel Filler, MD;  Location: [facility] OR;  Service: General;  Laterality: Left;   Patient Active Problem List   Diagnosis Date Noted   Poor dentition 06/23/2018   Tobacco use disorder 06/23/2018   Depression with anxiety 06/23/2018   Chronic bilateral low back pain with bilateral sciatica 06/23/2018   Porokeratosis 01/13/2018    REFERRING DIAG: M79.604,M79.605 (ICD-10-CM) - Bilateral leg pain   THERAPY DIAG:  No diagnosis found.  Rationale for Evaluation and Treatment Rehabilitation  PERTINENT HISTORY:  Logan Bailey is a 62 y.o. male here today for a acute visit.  He voices concerns about bilateral leg pain. Denies and trauma or falls. Patient has No headache, No chest pain, No abdominal pain - No Nausea, No new weakness tingling or numbness, No Cough - shortness of breath   No problems updated.  PRECAUTIONS: None   SUBJECTIVE:                                                                                                                                                                                      SUBJECTIVE STATEMENT: R low back symptoms a bit better.  Has not experienced any leg cramping lately.  PAIN:  Are you having pain?  Yes: NPRS scale: 1/10 Pain location: R anterior LE Pain description: cramping Aggravating factors: prolonged standing Relieving factors: position changes   OBJECTIVE: (objective  measures completed at initial evaluation unless otherwise dated)  DIAGNOSTIC FINDINGS: none available   PATIENT SURVEYS:  LEFS 24/80 (30% functional)       MUSCLE LENGTH: Hamstrings: Right 70d deg; Left 80 deg   POSTURE: No Significant postural limitations   PALPATION: Taught bands in B gastrocs and hamstring groups   LOWER EXTREMITY ROM:   A/PROM Right eval Left eval  Hip flexion [facility]  Hip extension      Hip abduction      Hip adduction      Hip internal rotation      Hip external rotation      Knee flexion 150d 150d  Knee extension 0d 0d  Ankle dorsiflexion 8/15d 4/15d  Ankle plantarflexion [facility] WFL  Ankle inversion [facility] WFL  Ankle eversion WFL WFL   (Blank rows = not tested)   LOWER EXTREMITY MMT:   MMT Right eval Left eval  Hip flexion 4 4  Hip extension 4 4  Hip abduction 4 4  Hip adduction      Hip internal rotation      Hip external rotation      Knee flexion 4 4  Knee extension 4 4  Ankle dorsiflexion 4 4  Ankle plantarflexion 4- 4-  Ankle inversion      Ankle eversion       (Blank rows = not tested)   LOWER EXTREMITY SPECIAL TESTS:  Deferred based on diffuse symptoms   FUNCTIONAL TESTS:  5 times sit to stand: 18s arms crossed   GAIT: Distance walked: 42ft x2 Assistive device utilized: None Level of assistance: Complete Independence Comments: antalgic     TREATMENT: [facility] Adult PT Treatment:                                                DATE: 12/16/22 Therapeutic Exercise: Nustep L5 8 min Seated hamstring stretch 30s x2 B Supine QL stretch 30s x2 B Curl ups L2 15x (arms crossed) [employer] with 5# ball 15x Supine march 2# 15/15 POE 2 min Supine hip fallouts BluTB 15x B, 15/15 unilaterally PF against wall 10x B, 10x R unable to perform on L  [facility] Adult PT Treatment:                                                DATE: 12/14/22 Therapeutic Exercise: Nustep L4 8 min Seated hamstring stretch 30s x2 B Supine QL stretch 30s  x2 B Curl ups L1 15x Bridge 15x Bridge with ball 15x Supine march 2# 15/15 POE 2 min Supine hip fallouts BluTB 15x B, 15/15 unilaterally PF against wall 15x B Seated PF stretch with towel 30s x2   [facility] Adult PT Treatment:                                                DATE: 12/02/22 Therapeutic Exercise: Nustep L4 8 min Seated hamstring stretch 30s x2 B Supine SLR 2# 2x15 Bil SAQs 2# 2x15 Bil PKB 30s x2 B Supine hip fallouts GTB 15x B, 15/15 unilaterally PF against wall 15x B Seated PF stretch with towel 30s x2  [facility] Adult PT Treatment:                                                DATE: 11/24/2022 Therapeutic Exercise: NuStep lvl 4 UE/LE x 4 min while taking subjective Seated hamstring stretch 2x30" Quad sets x 10 - 5" hold Supine SLR 2x10  Seated clamshell 2x15 GTB Slant board gastoc stretch 2x30" Slant board soleus stretch 2x30"  Mini squat B UE 2x10  Standing hip abd/ext 2x10 each Standing gastroc stretch x 30" each  PATIENT EDUCATION:  Education details: Continue HEP Person educated: Patient Education method: Explanation Education comprehension: verbalized understanding and needs further education   HOME EXERCISE PROGRAM: Access Code: [email or web address] URL: [email or web address] Date: 11/24/2022 Prepared by: Edwinna Areola  Exercises - Sit to Stand with Arms Crossed  - 2 x daily - 5 x weekly - 1 sets - 5 reps - Standing Heel Raises  - 2 x daily - 5 x weekly - 2 sets - 10 reps - Seated Hamstring Stretch  - 1 x daily - 7 x weekly - 2 reps - 30 sec hold - Gastroc Stretch on Wall  - 1 x daily - 7 x weekly - 2 reps - 30 sec hold   ASSESSMENT:   CLINICAL IMPRESSION: Focus today was R low back symptoms as B LE symptoms have essentially resolved.  Increased resistance and difficulty as noted to challenge strength and ROM/flexibility.  R single leg heel raises painful in forefoot, unable to perform on L due to weakness   OBJECTIVE IMPAIRMENTS: decreased  activity tolerance, decreased endurance, decreased knowledge of condition, difficulty walking, decreased ROM, increased muscle spasms, and pain.    ACTIVITY LIMITATIONS: standing and squatting   PERSONAL FACTORS: Age, Past/current experiences, and Time since onset of injury/illness/exacerbation are also affecting patient's functional outcome.    GOALS: Goals reviewed with patient? No   SHORT TERM GOALS=LONG TERM GOALS: Target date: 12/15/2022   Patient to demonstrate independence in HEP  Baseline: [id number]; 12/14/22 Reports compliance Goal status: Met   2.  Increase LEFS score to 32/80  Baseline: 24/80 Goal status: INITIAL   3.  Decrease 5s STS time to 15s arms crossed Baseline: 18s; 12/13/21 10s arms crossed  Goal status: Met   4.  Increase global BLE strength to 4+/5 Baseline:  MMT Right eval Left eval  Hip flexion 4 4  Hip extension 4 4  Hip abduction 4 4  Hip adduction      Hip internal rotation      Hip external rotation      Knee flexion 4 4  Knee extension 4 4  Ankle dorsiflexion 4 4  Ankle plantarflexion 4- 4-    Goal status: INITIAL   PLAN:   PT FREQUENCY: 2x/week   PT DURATION: 4 weeks   PLANNED INTERVENTIONS: Therapeutic exercises, Therapeutic activity, Neuromuscular re-education, Balance training, Gait training, Patient/Family education, Self Care, Joint mobilization, Stair training, Dry Needling, Manual therapy, and Re-evaluation   PLAN FOR NEXT SESSION: HEP review and update, aerobic work, stretching and flexibility tasks, strengthening   Hildred Laser, PT 12/24/2022, 11:59 AM

## 2022-12-28 ENCOUNTER — Ambulatory Visit: Payer: Medicaid Other

## 2022-12-28 ENCOUNTER — Telehealth: Payer: Self-pay

## 2022-12-28 NOTE — Telephone Encounter (Signed)
TC due to missed visit, alternate number used.  Spoke directly to patient, he was unable to attend PT due to co-pay amount.  Symptoms much improved and he elects to DC OPPT at this time.

## 2022-12-30 ENCOUNTER — Ambulatory Visit: Payer: Medicaid Other

## 2023-01-19 ENCOUNTER — Encounter (INDEPENDENT_AMBULATORY_CARE_PROVIDER_SITE_OTHER): Payer: Self-pay | Admitting: Primary Care

## 2023-01-19 ENCOUNTER — Ambulatory Visit (INDEPENDENT_AMBULATORY_CARE_PROVIDER_SITE_OTHER): Payer: Medicaid Other | Admitting: Primary Care

## 2023-01-19 VITALS — BP 135/79 | HR 57 | Resp 16 | Wt 171.6 lb

## 2023-01-19 DIAGNOSIS — M79671 Pain in right foot: Secondary | ICD-10-CM

## 2023-01-19 DIAGNOSIS — M79672 Pain in left foot: Secondary | ICD-10-CM | POA: Diagnosis not present

## 2023-01-22 NOTE — Progress Notes (Signed)
   Acute Office Visit  Subjective:     Patient ID: Logan Bailey, male    DOB: [birth year], 62 y.o.   MRN: [id number]  Chief Complaint  Patient presents with   Foot Pain    Foot Pain Mr. Logan Bailey. Logan Bailey is a 62 year old male c/o foot pain. Patient is well known to Clinical research associate he knows the answer to narcotic or anything other than meds for neuropathy and tylenol and NSAID is the answer but still ask. Podiatrist wants to put steroid injections and would like know what PCP thoughts are. Explained it can be very helpful with reducing or alleviating the pain but the length of time can be variable. Discussed some disadvantages ie weakens bones given Q 4-6 months. He was  satisfied with discussion. Patient has No headache, No chest pain, No abdominal pain - No Nausea, No new weakness tingling or numbness, No Cough - shortness of breath .  ROS Comprehensive ROS Pertinent positive and negative noted in HPI       Objective:    Blood Pressure 135/79   Pulse (Abnormal) 57   Respiration 16   Weight 171 lb 9.6 oz (77.8 kg)   Oxygen Saturation 98%   Body Mass Index 22.03 kg/m   Physical Exam Vitals reviewed.  Constitutional:      Appearance: Normal appearance. He is normal weight.  HENT:     Head: Normocephalic.     Right Ear: External ear normal.     Left Ear: External ear normal.     Nose: Nose normal.  Cardiovascular:     Rate and Rhythm: Normal rate and regular rhythm.  Pulmonary:     Effort: Pulmonary effort is normal.     Breath sounds: Normal breath sounds.  Abdominal:     General: Bowel sounds are normal.     Palpations: Abdomen is soft.  Musculoskeletal:        General: Normal range of motion.     Cervical back: Normal range of motion.  Skin:    General: Skin is warm and dry.  Neurological:     Mental Status: He is alert. Mental status is at baseline.  Psychiatric:        Mood and Affect: Mood normal.        Behavior: Behavior normal.   No results found for any visits  on 01/19/23.      Assessment & Plan:  Logan Bailey was seen today for foot pain.  Diagnoses and all orders for this visit:  Foot pain, bilateral Followed by podiatry d/w and listen to concerns about injections in his feet    Grayce Sessions, NP

## 2023-02-15 ENCOUNTER — Ambulatory Visit: Payer: Medicaid Other | Admitting: Podiatry

## 2023-05-03 ENCOUNTER — Telehealth (INDEPENDENT_AMBULATORY_CARE_PROVIDER_SITE_OTHER): Payer: Self-pay | Admitting: Primary Care

## 2023-05-04 ENCOUNTER — Ambulatory Visit (INDEPENDENT_AMBULATORY_CARE_PROVIDER_SITE_OTHER): Payer: MEDICAID | Admitting: Primary Care

## 2023-05-04 ENCOUNTER — Encounter (INDEPENDENT_AMBULATORY_CARE_PROVIDER_SITE_OTHER): Payer: Self-pay | Admitting: Primary Care

## 2023-05-04 VITALS — BP 133/77 | HR 53 | Resp 16 | Wt 171.6 lb

## 2023-05-04 DIAGNOSIS — Z23 Encounter for immunization: Secondary | ICD-10-CM | POA: Diagnosis not present

## 2023-05-04 DIAGNOSIS — E782 Mixed hyperlipidemia: Secondary | ICD-10-CM | POA: Diagnosis not present

## 2023-05-04 DIAGNOSIS — I10 Essential (primary) hypertension: Secondary | ICD-10-CM

## 2023-05-04 DIAGNOSIS — M79671 Pain in right foot: Secondary | ICD-10-CM

## 2023-05-04 DIAGNOSIS — M79672 Pain in left foot: Secondary | ICD-10-CM

## 2023-05-04 NOTE — Progress Notes (Signed)
Acute Office Visit  Subjective:     Patient ID: KEBRON SASSONE, male    DOB: 1961-08-07, 62 y.o.   MRN: [id number]  Chief Complaint  Patient presents with  . Foot Pain    B/l    Foot Pain Mr. Savyon Mockler. Sitton is a 62 year old male c/o foot pain. Podiatrist wants to put steroid injections and would like know what PCP thoughts are. Explained it can be very helpful with reducing or alleviating the pain but the length of time can be variable. Discussed some disadvantages ie weakens bones given Q 4-6 months. He was  satisfied with discussion. Patient has No headache, No chest pain, No abdominal pain - No Nausea, No new weakness tingling or numbness, No Cough - shortness of breath .  ROS Comprehensive ROS Pertinent positive and negative noted in HPI       Objective:    BP 133/77 (BP Location: Left Arm, Patient Position: Sitting, Cuff Size: Normal)   Pulse (!) 53   Resp 16   Wt 171 lb 9.6 oz (77.8 kg)   SpO2 99%   BMI 22.03 kg/m   Physical Exam Vitals reviewed.  Constitutional:      Appearance: Normal appearance. He is normal weight.  HENT:     Head: Normocephalic.     Right Ear: External ear normal.     Left Ear: There is impacted cerumen.     Nose: Nose normal.  Cardiovascular:     Rate and Rhythm: Normal rate and regular rhythm.  Pulmonary:     Effort: Pulmonary effort is normal.     Breath sounds: Normal breath sounds.  Abdominal:     General: Bowel sounds are normal.     Palpations: Abdomen is soft.  Musculoskeletal:        General: Normal range of motion.     Cervical back: Normal range of motion.  Skin:    General: Skin is warm and dry.  Neurological:     Mental Status: He is alert. Mental status is at baseline.  Psychiatric:        Mood and Affect: Mood normal.        Behavior: Behavior normal.   No results found for any visits on 05/04/23.      Assessment & Plan:  Olivia was seen today for foot pain.  Diagnoses and all orders for this  visit:  Foot pain, bilateral  Followed by podiatry painful porokeratotic lesion(s) b/l feet and painful mycotic toenails that limit ambulation. Painful toenails interfere with ambulation   Encounter for immunization -     Flu vaccine trivalent PF, 6mos and older(Flulaval,Afluria,Fluarix,Fluzone)  Essential hypertension Controlled . BP goal - <140/90Explained that having normal blood pressure is the goal and medications are helping to get to goal and maintain normal blood pressure. DIET: Limit salt intake, read nutrition labels to check salt content, limit fried and high fatty foods  Avoid using multisymptom OTC cold preparations that generally contain sudafed which can rise BP. Consult with pharmacist on best cold relief products to use for persons with HTN EXERCISE Discussed incorporating exercise such as walking - 30 minutes most days of the week and can do in 10 minute intervals    -     CMP14+EGFR  Mixed hyperlipidemia  Healthy lifestyle diet of fruits vegetables fish nuts whole grains and low saturated fat . Foods high in cholesterol or liver, fatty meats,cheese, butter avocados, nuts and seeds, chocolate and fried foods.  -  Lipid panel       Grayce Sessions, NP

## 2023-05-04 NOTE — Patient Instructions (Addendum)
Referral Entered By     Sheran Luz, RN  Bevelyn Ngo, NP [facility]   Influenza Vaccine Injection What is this medication? INFLUENZA VACCINE (in floo EN zuh vak SEEN) reduces the risk of the influenza (flu). It does not treat influenza. It is still possible to get influenza after receiving this vaccine, but the symptoms may be less severe or not last as long. It works by helping your immune system learn how to fight off a future infection. This medicine may be used for other purposes; ask your health care provider or pharmacist if you have questions. COMMON BRAND NAME(S): Afluria Quadrivalent, FLUAD Quadrivalent, Fluarix Quadrivalent, Flublok Quadrivalent, FLUCELVAX Quadrivalent, Flulaval Quadrivalent, Fluzone Quadrivalent What should I tell my care team before I take this medication? They need to know if you have any of these conditions: Bleeding disorder like hemophilia Fever or infection Guillain-Barre syndrome or other neurological problems Immune system problems Infection with the human immunodeficiency virus (HIV) or AIDS Low blood platelet counts Multiple sclerosis An unusual or allergic reaction to influenza virus vaccine, latex, other medications, foods, dyes, or preservatives. Different brands of vaccines contain different allergens. Some may contain latex or eggs. Talk to your care team about your allergies to make sure that you get the right vaccine. Pregnant or trying to get pregnant Breastfeeding How should I use this medication? This vaccine is injected into a muscle or under the skin. It is given by your care team. A copy of Vaccine Information Statements will be given before each vaccination. Be sure to read this sheet carefully each time. This sheet may change often. Talk to your care team to see which vaccines are right for you. Some vaccines should not be used in all age groups. Overdosage: If you think you have taken too much of this  medicine contact a poison control center or emergency room at once. NOTE: This medicine is only for you. Do not share this medicine with others. What if I miss a dose? This does not apply. What may interact with this medication? Certain medications that lower your immune system, such as etanercept, anakinra, infliximab, adalimumab Certain medications that prevent or treat blood clots, such as warfarin Chemotherapy or radiation therapy Phenytoin Steroid medications, such as prednisone or cortisone Theophylline Vaccines This list may not describe all possible interactions. Give your health care provider a list of all the medicines, herbs, non-prescription drugs, or dietary supplements you use. Also tell them if you smoke, drink alcohol, or use illegal drugs. Some items may interact with your medicine. What should I watch for while using this medication? Report any side effects that do not go away with your care team. Call your care team if any unusual symptoms occur within 6 weeks of receiving this vaccine. You may still catch the flu, but the illness is not usually as bad. You cannot get the flu from the vaccine. The vaccine will not protect against colds or other illnesses that may cause fever. The vaccine is needed every year. What side effects may I notice from receiving this medication? Side effects that you should report to your care team as soon as possible: Allergic reactions--skin rash, itching, hives, swelling of the face, lips, tongue, or throat Side effects that usually do not require medical attention (report these to your care team if they continue or are bothersome): Chills Fatigue Headache Joint pain Loss of appetite Muscle pain Nausea Pain, redness, or irritation at injection site This list may not describe  all possible side effects. Call your doctor for medical advice about side effects. You may report side effects to FDA at 1-800-FDA-1088. Where should I keep my  medication? The vaccine is only given by your care team. It will not be stored at home. NOTE: This sheet is a summary. It may not cover all possible information. If you have questions about this medicine, talk to your doctor, pharmacist, or health care provider.  2024 Elsevier/Gold Standard (2022-01-05 00:00:00) Priority Start Date Expiration Date Referral Entered By  Routine 12/21/2022 12/21/2023 Sheran Luz, RN

## 2023-05-05 LAB — CMP14+EGFR
ALT: 33 IU/L (ref 0–44)
AST: 25 IU/L (ref 0–40)
Albumin: 4.6 g/dL (ref 3.9–4.9)
Alkaline Phosphatase: 80 IU/L (ref 44–121)
BUN/Creatinine Ratio: 10 (ref 10–24)
BUN: 10 mg/dL (ref 8–27)
Bilirubin Total: 0.4 mg/dL (ref 0.0–1.2)
CO2: 22 mmol/L (ref 20–29)
Calcium: 9.2 mg/dL (ref 8.6–10.2)
Chloride: 103 mmol/L (ref 96–106)
Creatinine, Ser: 0.97 mg/dL (ref 0.76–1.27)
Globulin, Total: 2.8 g/dL (ref 1.5–4.5)
Glucose: 71 mg/dL (ref 70–99)
Potassium: 4.8 mmol/L (ref 3.5–5.2)
Sodium: 142 mmol/L (ref 134–144)
Total Protein: 7.4 g/dL (ref 6.0–8.5)
eGFR: 89 mL/min/{1.73_m2} (ref >59)

## 2023-05-05 LAB — LIPID PANEL
Chol/HDL Ratio: 4.7 ratio (ref 0.0–5.0)
Cholesterol, Total: 202 mg/dL — ABNORMAL HIGH (ref 100–199)
HDL: 43 mg/dL (ref >39)
LDL Chol Calc (NIH): 143 mg/dL — ABNORMAL HIGH (ref 0–99)
Triglycerides: 89 mg/dL (ref 0–149)
VLDL Cholesterol Cal: 16 mg/dL (ref 5–40)

## 2023-05-06 ENCOUNTER — Other Ambulatory Visit (INDEPENDENT_AMBULATORY_CARE_PROVIDER_SITE_OTHER): Payer: Self-pay | Admitting: Primary Care

## 2023-05-06 ENCOUNTER — Ambulatory Visit (INDEPENDENT_AMBULATORY_CARE_PROVIDER_SITE_OTHER): Payer: MEDICAID

## 2023-05-06 MED ORDER — PRAVASTATIN SODIUM 20 MG PO TABS: 20.0000 mg | ORAL_TABLET | Freq: Every day | ORAL | 1 refills | Status: DC

## 2023-05-09 ENCOUNTER — Ambulatory Visit (INDEPENDENT_AMBULATORY_CARE_PROVIDER_SITE_OTHER): Payer: MEDICAID

## 2023-05-10 ENCOUNTER — Ambulatory Visit (INDEPENDENT_AMBULATORY_CARE_PROVIDER_SITE_OTHER): Payer: MEDICAID

## 2023-06-10 NOTE — Telephone Encounter (Signed)
Spoke to pt. Will be present at apt 9/25

## 2023-06-20 ENCOUNTER — Inpatient Hospital Stay: Admission: RE | Admit: 2023-06-20 | Payer: Medicaid Other | Source: Ambulatory Visit

## 2023-08-08 ENCOUNTER — Encounter (INDEPENDENT_AMBULATORY_CARE_PROVIDER_SITE_OTHER): Payer: Self-pay | Admitting: Primary Care

## 2023-08-08 ENCOUNTER — Ambulatory Visit (INDEPENDENT_AMBULATORY_CARE_PROVIDER_SITE_OTHER): Payer: MEDICAID | Admitting: Primary Care

## 2023-08-08 VITALS — BP 127/85 | HR 67 | Resp 16 | Wt 181.6 lb

## 2023-08-08 DIAGNOSIS — Z5941 Food insecurity: Secondary | ICD-10-CM

## 2023-08-08 DIAGNOSIS — E782 Mixed hyperlipidemia: Secondary | ICD-10-CM

## 2023-08-08 NOTE — Progress Notes (Signed)
[facility]  Logan Bailey, is a 62 y.o. male  ZOX:[id number]  [id number]  DOB - October 07, 1960  Chief Complaint  Patient presents with   Hyperlipidemia       Subjective:   Logan Bailey is a 62 y.o. male here today for a follow up visit. Patient has No headache, No chest pain, No abdominal pain - No Nausea, No new weakness tingling or numbness, No Cough - shortness of breath  Hyperlipidemia This is a chronic problem. The current episode started more than 1 year ago. The problem is uncontrolled. Recent lipid tests were reviewed and are high. Factors aggravating his hyperlipidemia include fatty foods, atypical antipsychotics and smoking. Current antihyperlipidemic treatment includes statins. Compliance problems include psychosocial issues.  Risk factors for coronary artery disease include male sex and dyslipidemia.      No problems updated.  Comprehensive ROS Pertinent positive and negative noted in HPI   Allergies  Allergen Reactions   Bee Venom Anaphylaxis and Hives    Past Medical History:  Diagnosis Date   Depression    GERD (gastroesophageal reflux disease)    Seasonal allergies     Current Outpatient Medications on File Prior to Visit  Medication Sig Dispense Refill   EPINEPHrine 0.3 mg/0.3 mL IJ SOAJ injection Inject 0.3 mLs (0.3 mg total) into the muscle as needed for anaphylaxis. 1 each 0   gabapentin (NEURONTIN) 300 MG capsule take 1 capsule (300 mg total) by mouth three times daily 90 capsule 2   gabapentin (NEURONTIN) 300 MG capsule Take 1 capsule (300 mg total) by mouth 3 (three) times daily. 90 capsule 0   gabapentin (NEURONTIN) 300 MG capsule Take 1 capsule (300 mg total) by mouth 3 (three) times daily as needed. 90 capsule 2   lumateperone tosylate (CAPLYTA) 42 MG capsule Take 1 capsule (42 mg total) by mouth daily as directed. 30 capsule 2   omeprazole (PRILOSEC) 40 MG capsule Take 1 capsule (40 mg total) by mouth daily. 30 capsule 5    PERSERIS 90 MG PRSY SMARTSIG:1 Milligram(s) IM Every 4 Weeks     polyethylene glycol powder (GLYCOLAX/MIRALAX) 17 GM/SCOOP powder TAKE 17 GRAMS BY MOUTH DAILY 510 g 2   pravastatin (PRAVACHOL) 20 MG tablet Take 1 tablet (20 mg total) by mouth daily. 90 tablet 1   risperiDONE ER (PERSERIS) 90 MG PRSY Inject 1 mg by Intramuscularly route every 4 weeks. 1 each 2   risperiDONE ER (PERSERIS) 90 MG PRSY Inject 1 mg intramuscularly every four weeks as directed 1 each 2   risperiDONE ER (PERSERIS) 90 MG PRSY Inject 1 mg into the muscle every every four weeks as directed 1 each 2   [DISCONTINUED] buPROPion (WELLBUTRIN XL) 150 MG 24 hr tablet Take 1 tablet (150 mg total) by mouth daily. For smoking cessation 90 tablet 1   [DISCONTINUED] famotidine (PEPCID) 20 MG tablet Take 1 tablet (20 mg total) by mouth 2 (two) times daily. (Patient not taking: No sig reported) 30 tablet 0   No current facility-administered medications on file prior to visit.   Health Maintenance  Topic Date Due   COVID-19 Vaccine (1) Never done   Screening for Lung Cancer  12/16/2023   Cologuard (Stool DNA test)  02/18/2025   DTaP/Tdap/Td vaccine (2 - Td or Tdap) 09/14/2027   Flu Shot  Completed   Hepatitis C Screening  Completed   HIV Screening  Completed   Zoster (Shingles) Vaccine  Completed   HPV Vaccine  Aged Out  Objective:   Vitals:   08/08/23 1104  BP: 127/85  Pulse: 67  Resp: 16  SpO2: 97%  Weight: 181 lb 9.6 oz (82.4 kg)   BP Readings from Last 3 Encounters:  08/08/23 127/85  05/04/23 133/77  01/19/23 135/79      Physical Exam Vitals reviewed.  Constitutional:      Appearance: Normal appearance.  HENT:     Head: Normocephalic.     Right Ear: Tympanic membrane and external ear normal.     Left Ear: Tympanic membrane and external ear normal.     Nose: Nose normal.  Eyes:     Extraocular Movements: Extraocular movements intact.     Pupils: Pupils are equal, round, and reactive to light.   Cardiovascular:     Rate and Rhythm: Normal rate and regular rhythm.  Pulmonary:     Effort: Pulmonary effort is normal.     Breath sounds: Normal breath sounds.  Abdominal:     General: Abdomen is flat. Bowel sounds are normal.     Palpations: Abdomen is soft.  Musculoskeletal:     Cervical back: Normal range of motion.  Skin:    General: Skin is warm and dry.  Neurological:     Mental Status: He is alert and oriented to person, place, and time.  Psychiatric:        Mood and Affect: Mood normal.        Behavior: Behavior normal.       Assessment & Plan  Food insecurity -     AMB Referral [facility]BCI Care Management  Mixed hyperlipidemia -     Lipid panel     Patient have been counseled extensively about nutrition and exercise. Other issues discussed during this visit include: low cholesterol diet, weight control and daily exercise, foot care, annual eye examinations at Ophthalmology, importance of adherence with medications and regular follow-up. We also discussed long term complications of uncontrolled diabetes and hypertension.   Return in about 3 months (around 11/06/2023).  The patient was given clear instructions to go to ER or return to medical center if symptoms don't improve, worsen or new problems develop. The patient verbalized understanding. The patient was told to call to get lab results if they haven't heard anything in the next week.   This note has been created with Education officer, environmental. Any transcriptional errors are unintentional.   Grayce Sessions, NP 08/08/2023, 11:11 AM

## 2023-08-09 LAB — LIPID PANEL
Chol/HDL Ratio: 4.5 {ratio} (ref 0.0–5.0)
Cholesterol, Total: 202 mg/dL — ABNORMAL HIGH (ref 100–199)
HDL: 45 mg/dL (ref >39)
LDL Chol Calc (NIH): 129 mg/dL — ABNORMAL HIGH (ref 0–99)
Triglycerides: 155 mg/dL — ABNORMAL HIGH (ref 0–149)
VLDL Cholesterol Cal: 28 mg/dL (ref 5–40)

## 2023-08-29 ENCOUNTER — Encounter (INDEPENDENT_AMBULATORY_CARE_PROVIDER_SITE_OTHER): Payer: Self-pay

## 2023-09-27 ENCOUNTER — Other Ambulatory Visit (INDEPENDENT_AMBULATORY_CARE_PROVIDER_SITE_OTHER): Payer: Self-pay | Admitting: Primary Care

## 2023-09-27 ENCOUNTER — Telehealth (INDEPENDENT_AMBULATORY_CARE_PROVIDER_SITE_OTHER): Payer: Self-pay | Admitting: Primary Care

## 2023-09-27 NOTE — Telephone Encounter (Signed)
[employer] called in asking if paperwork is complete. I told the [employer] agent that I would send a message to the nurse to see if paperwork has been processed.

## 2023-09-27 NOTE — Telephone Encounter (Signed)
Requested Prescriptions  Pending Prescriptions Disp Refills   pravastatin (PRAVACHOL) 20 MG tablet [Pharmacy Med Name: PRAVASTATIN SODIUM 20MG  TAB] 90 tablet 4    Sig: TAKE 1 TABLET (20 MG TOTAL) BY MOUTH DAILY.     Cardiovascular:  Antilipid - Statins Failed - 09/27/2023  3:59 PM      Failed - Lipid Panel in normal range within the last 12 months    Cholesterol, Total  Date Value Ref Range Status  08/08/2023 202 (H) 100 - 199 mg/dL Final   LDL Chol Calc (NIH)  Date Value Ref Range Status  08/08/2023 129 (H) 0 - 99 mg/dL Final   HDL  Date Value Ref Range Status  08/08/2023 45 >39 mg/dL Final   Triglycerides  Date Value Ref Range Status  08/08/2023 155 (H) 0 - 149 mg/dL Final         Passed - Patient is not pregnant      Passed - Valid encounter within last 12 months    Recent Outpatient Visits           1 month ago Food insecurity   [place] [facility] Grayce Sessions, NP   4 months ago Encounter for immunization   [place] [facility] Grayce Sessions, NP   8 months ago Foot pain, bilateral   [place] [facility] Grayce Sessions, NP   11 months ago Bilateral leg pain   [place] [facility] Grayce Sessions, NP   1 year ago Need for immunization against influenza   [place] [facility] Grayce Sessions, NP       Future Appointments             In 1 month Randa Evens, Kinnie Scales, NP [place] [facility]

## 2023-10-03 ENCOUNTER — Telehealth (INDEPENDENT_AMBULATORY_CARE_PROVIDER_SITE_OTHER): Payer: Self-pay | Admitting: Primary Care

## 2023-10-03 ENCOUNTER — Telehealth (INDEPENDENT_AMBULATORY_CARE_PROVIDER_SITE_OTHER): Payer: Self-pay

## 2023-10-03 NOTE — Telephone Encounter (Signed)
 Pt called in to follow up on paperwork. [employer] agent called to see if there was an update. I told agent that I would send a telephone encounter to nurse and see if she could follow up on paperwork.

## 2023-10-03 NOTE — Telephone Encounter (Signed)
 Copied from CRM [id number]. Topic: General - Other >> Sep 27, 2023  9:16 AM Fuller Mandril wrote: Reason for CRM: Patient called to check on status of [employer] forms dropped off on the 3rd. Called CAL. Can take 7-10 business days. Will reach out to nurse for update. >> Sep 30, 2023 12:57 PM Dennison Nancy wrote: Patient called on 09/30/23 checking on status of paperwork for [employer] forms dropped off on the 3rd.

## 2023-10-03 NOTE — Telephone Encounter (Signed)
 Kellen I have not received any [employer] paperwork for pt. The only paperwork that I received was for [facility] services

## 2023-10-03 NOTE — Telephone Encounter (Signed)
Will reach out to pt.

## 2023-10-03 NOTE — Telephone Encounter (Signed)
 The only paperwork I received for pt was for pcs services

## 2023-10-04 ENCOUNTER — Encounter: Payer: Self-pay | Admitting: Acute Care

## 2023-10-11 NOTE — Telephone Encounter (Signed)
 Copied from CRM [id number]. Topic: General - Other >> Sep 27, 2023  9:16 AM Fuller Mandril wrote: Reason for CRM: Patient called to check on status of [employer] forms dropped off on the 3rd. Called CAL. Can take 7-10 business days. Will reach out to nurse for update.

## 2023-10-14 ENCOUNTER — Telehealth (INDEPENDENT_AMBULATORY_CARE_PROVIDER_SITE_OTHER): Payer: Self-pay | Admitting: Primary Care

## 2023-10-14 NOTE — Telephone Encounter (Signed)
 In response to CRM. Paperwork was received and given to case Production designer, theatre/television/film. Pt stating he needs paperwork sent to [employer] and the number to whom it concerns is listed on the paperwork. I will forward message to case worker.

## 2023-10-14 NOTE — Telephone Encounter (Signed)
 I spoke to him and he said he talked to " Miss Misty Stanley" who told him that they could help him around the house. He could not tell me what agency Miss Misty Stanley works for but he said he will call [employer] back with that information after he finishes his foot surgery today at [facility].   I explained to him that the focus of the PCS aide is to assist with personal care, like dressing and bathing, not housekeeping.  He said he understood but he needs help around the house.  I told him that his insurance company would have a nurse come out to assess him and determine if he qualifies for services and if so how many days/ week and hours/ day he qualifies for.   Logan Bailey - if you support the need for PCS, I will complete the form

## 2023-10-14 NOTE — Telephone Encounter (Signed)
 Duplicate message see TE from 10/14/23

## 2023-10-17 ENCOUNTER — Telehealth (INDEPENDENT_AMBULATORY_CARE_PROVIDER_SITE_OTHER): Payer: Self-pay

## 2023-10-17 NOTE — Telephone Encounter (Signed)
 noted

## 2023-10-17 NOTE — Telephone Encounter (Signed)
 Kellen did you refund form fee to patient? Pt didn't have to pay for that form.   Erskine Squibb

## 2023-10-17 NOTE — Telephone Encounter (Signed)
 Copied from CRM [id number]. Topic: General - Call Back - No Documentation >> Oct 14, 2023  9:58 AM Higinio Roger wrote: Reason for CRM:  Patient called to check on status of [employer] forms dropped off on the 3rd. Patient stated he did drop off paperwork as well as $25 to fill out the paperwork. Callback #: [phone]

## 2023-10-17 NOTE — Telephone Encounter (Signed)
 Looks like provider okay for [employer].  Please refer to telephone encounter 10/14/23

## 2023-10-18 NOTE — Telephone Encounter (Signed)
 Reached out to pt to go over Erskine Squibb message pt doesn't have any questions or concerns   Logan Bailey pt will need $25 form fee returned at his upcoming appt

## 2023-10-27 ENCOUNTER — Telehealth (INDEPENDENT_AMBULATORY_CARE_PROVIDER_SITE_OTHER): Payer: Self-pay | Admitting: Primary Care

## 2023-10-27 NOTE — Telephone Encounter (Signed)
 Called pt to remind them about appt. Pt will be present.

## 2023-10-31 ENCOUNTER — Ambulatory Visit: Payer: MEDICAID | Admitting: Podiatry

## 2023-11-07 ENCOUNTER — Ambulatory Visit (INDEPENDENT_AMBULATORY_CARE_PROVIDER_SITE_OTHER): Payer: MEDICAID | Admitting: Primary Care

## 2023-11-07 ENCOUNTER — Encounter (INDEPENDENT_AMBULATORY_CARE_PROVIDER_SITE_OTHER): Payer: Self-pay | Admitting: Primary Care

## 2023-11-07 VITALS — BP 109/71 | HR 52 | Wt 183.6 lb

## 2023-11-07 DIAGNOSIS — Z122 Encounter for screening for malignant neoplasm of respiratory organs: Secondary | ICD-10-CM

## 2023-11-07 DIAGNOSIS — Z23 Encounter for immunization: Secondary | ICD-10-CM | POA: Diagnosis not present

## 2023-11-07 DIAGNOSIS — F17210 Nicotine dependence, cigarettes, uncomplicated: Secondary | ICD-10-CM | POA: Diagnosis not present

## 2023-11-14 ENCOUNTER — Ambulatory Visit (INDEPENDENT_AMBULATORY_CARE_PROVIDER_SITE_OTHER): Payer: MEDICAID | Admitting: Podiatry

## 2023-11-14 ENCOUNTER — Encounter: Payer: Self-pay | Admitting: Podiatry

## 2023-11-14 DIAGNOSIS — M722 Plantar fascial fibromatosis: Secondary | ICD-10-CM | POA: Diagnosis not present

## 2023-11-14 NOTE — Progress Notes (Signed)
   Chief Complaint  Patient presents with   Foot Orthotics    Patient is here to discuss orthotics    HPI: 63 y.o. male presenting today for evaluation of pain and tenderness associated to bilateral feet.  Chronic ongoing for several years.  He says that insoles help significantly reduce his pain and tenderness.  He presents for further treatment and evaluation  Past Medical History:  Diagnosis Date   Depression    GERD (gastroesophageal reflux disease)    Seasonal allergies     Past Surgical History:  Procedure Laterality Date   HERNIA REPAIR  10/16/2012   incarcerated   INGUINAL HERNIA REPAIR Left 10/16/2012   Procedure: HERNIA REPAIR INGUINAL ADULT;  Surgeon: Axel Filler, MD;  Location: [facility] OR;  Service: General;  Laterality: Left;    Allergies  Allergen Reactions   Bee Venom Anaphylaxis and Hives     Physical Exam: General: The patient is alert and oriented x3 in no acute distress.  Dermatology: Skin is warm, dry and supple bilateral lower extremities.  Hyperkeratotic lesions noted to the plantar aspect of the feet bilateral  Vascular: Palpable pedal pulses bilaterally. Capillary refill within normal limits.  No appreciable edema.  No erythema.  Neurological: Grossly intact via light touch  Musculoskeletal Exam: No pedal deformities noted  Radiographic Exam:  Normal osseous mineralization. Joint spaces preserved.  No fractures or osseous irregularities noted.  Assessment/Plan of Care: 1.  History of chronic foot pain bilateral 2.  History of chronic plantar fasciitis bilateral  3.  Symptomatic lesions bilateral feet  -Patient evaluated -Patient routinely [facility] to [facility].  Continue -OTC power step insoles were dispensed to support the medial longitudinal arch of the foot bilateral and offload pressure from the heel.  Wear daily. -Return to clinic as needed       Logan Bailey, DPM [facility]  Dr.  Felecia Bailey, DPM    [address] [place], [place] [address]                Office [phone]  Fax [phone]

## 2023-11-14 NOTE — Progress Notes (Signed)
 [facility]  Logan Bailey, is a 63 y.o. male  ZOX:[id number]  [id number]:[id number]  DOB - [birth year]  Chief Complaint  Patient presents with   Medical Management of Chronic Issues       Subjective:   Logan Bailey is a 63 y.o. male here today for a follow up visit.  Healthcare maintenance.  Patient has No headache, No chest pain, No abdominal pain - No Nausea, No new weakness tingling or numbness, No Cough - shortness of breath   No problems updated.  Comprehensive ROS Pertinent positive and negative noted in HPI   Allergies  Allergen Reactions   Bee Venom Anaphylaxis and Hives    Past Medical History:  Diagnosis Date   Depression    GERD (gastroesophageal reflux disease)    Seasonal allergies     Current Outpatient Medications on File Prior to Visit  Medication Sig Dispense Refill   EPINEPHrine 0.3 mg/0.3 mL IJ SOAJ injection Inject 0.3 mLs (0.3 mg total) into the muscle as needed for anaphylaxis. 1 each 0   gabapentin (NEURONTIN) 300 MG capsule take 1 capsule (300 mg total) by mouth three times daily 90 capsule 2   gabapentin (NEURONTIN) 300 MG capsule Take 1 capsule (300 mg total) by mouth 3 (three) times daily. 90 capsule 0   gabapentin (NEURONTIN) 300 MG capsule Take 1 capsule (300 mg total) by mouth 3 (three) times daily as needed. 90 capsule 2   lumateperone tosylate (CAPLYTA) 42 MG capsule Take 1 capsule (42 mg total) by mouth daily as directed. 30 capsule 2   omeprazole (PRILOSEC) 40 MG capsule Take 1 capsule (40 mg total) by mouth daily. 30 capsule 5   PERSERIS 90 MG PRSY SMARTSIG:1 Milligram(s) IM Every 4 Weeks     polyethylene glycol powder (GLYCOLAX/MIRALAX) 17 GM/SCOOP powder TAKE 17 GRAMS BY MOUTH DAILY 510 g 2   pravastatin (PRAVACHOL) 20 MG tablet TAKE 1 TABLET (20 MG TOTAL) BY MOUTH DAILY. 90 tablet 1   risperiDONE ER (PERSERIS) 90 MG PRSY Inject 1 mg by Intramuscularly route every 4 weeks. 1 each 2   risperiDONE ER (PERSERIS) 90 MG PRSY  Inject 1 mg intramuscularly every four weeks as directed 1 each 2   risperiDONE ER (PERSERIS) 90 MG PRSY Inject 1 mg into the muscle every every four weeks as directed 1 each 2   [DISCONTINUED] buPROPion (WELLBUTRIN XL) 150 MG 24 hr tablet Take 1 tablet (150 mg total) by mouth daily. For smoking cessation 90 tablet 1   [DISCONTINUED] famotidine (PEPCID) 20 MG tablet Take 1 tablet (20 mg total) by mouth 2 (two) times daily. (Patient not taking: Reported on 10/01/2020) 30 tablet 0   No current facility-administered medications on file prior to visit.   Health Maintenance  Topic Date Due   COVID-19 Vaccine (1) Never done   Screening for Lung Cancer  12/16/2023   Flu Shot  03/09/2024   Cologuard (Stool DNA test)  02/18/2025   DTaP/Tdap/Td vaccine (2 - Td or Tdap) 09/14/2027   Pneumococcal Vaccination  Completed   Hepatitis C Screening  Completed   HIV Screening  Completed   Zoster (Shingles) Vaccine  Completed   HPV Vaccine  Aged Out    Objective:   Vitals:   11/07/23 1122  BP: 109/71  Pulse: (!) 52  SpO2: 98%  Weight: 183 lb 9.6 oz (83.3 kg)   BP Readings from Last 3 Encounters:  11/07/23 109/71  08/08/23 127/85  05/04/23 133/77      Physical  Exam HENT:     Head: Normocephalic.     Right Ear: External ear normal.     Left Ear: External ear normal.     Nose: Nose normal.  Eyes:     Extraocular Movements: Extraocular movements intact.  Cardiovascular:     Rate and Rhythm: Normal rate and regular rhythm.  Abdominal:     General: Bowel sounds are normal. There is distension.     Palpations: Abdomen is soft.  Musculoskeletal:        General: Normal range of motion.     Cervical back: Normal range of motion.  Skin:    General: Skin is warm and dry.  Neurological:     Mental Status: He is alert. Mental status is at baseline.  Psychiatric:        Mood and Affect: Mood normal.        Behavior: Behavior normal.    Assessment & Plan  Logan Bailey was seen today for medical  management of chronic issues.  Diagnoses and all orders for this visit:  Encounter for screening for lung cancer Tentatively due on 12/16/2023    Need for Streptococcus pneumoniae vaccination -     Pneumococcal conjugate vaccine 20-valent    Patient have been counseled extensively about nutrition and exercise. Other issues discussed during this visit include: low cholesterol diet, weight control and daily exercise, foot care, annual eye examinations at Ophthalmology, importance of adherence with medications and regular follow-up. We also discussed long term complications of uncontrolled diabetes and hypertension.   Return in about 6 months (around 05/08/2024).  The patient was given clear instructions to go to ER or return to medical center if symptoms don't improve, worsen or new problems develop. The patient verbalized understanding. The patient was told to call to get lab results if they haven't heard anything in the next week.   This note has been created with Education officer, environmental. Any transcriptional errors are unintentional.   Grayce Sessions, NP 11/14/2023, 1:42 PM

## 2023-12-05 ENCOUNTER — Telehealth (INDEPENDENT_AMBULATORY_CARE_PROVIDER_SITE_OTHER): Payer: Self-pay | Admitting: Primary Care

## 2023-12-05 NOTE — Telephone Encounter (Signed)
 Logan Bailey Would [name] just place referrals.. Also would it be best to reach out to pt and a appt for why he is needing pcs services

## 2023-12-05 NOTE — Telephone Encounter (Signed)
 Pt stated that he is coming in because he is in need of a referral to [facility] [phone] Loyce Ruffini is contact person.   Pt stated that paperwork was filled out but was sent to the wrong place. Needs a referral to social service [phone] and [facility]. Pt stats that they need this done as soon as possible because they have waited a while.

## 2023-12-07 NOTE — Telephone Encounter (Signed)
 I spoke to the patient and he said he still has not heard anything about his PCS services.  He said he called [employer] and was told they need a referral.  I told him that I would contact [employer] to see what happened with the referral.  SECURE email sent to [email or web address] requesting an update on the status of the referral for PCS that was sent on 10/18/2023.  The original PCS request was sent with this email .

## 2023-12-12 NOTE — Telephone Encounter (Signed)
 I called [employer] member services : [phone] and spoke to to Mariah Shines trying to determine if they have received the [facility] request.   Mariah Shines was not able to see any information about the [facility] request that was sent to them 10/18/2023.  Cal ID: [id number].  I then sent a message to [email or web address] requesting another update on the status of the request.

## 2023-12-13 NOTE — Telephone Encounter (Signed)
 The following message was received from Julius Ohs, Support Specialist- [facility] PCS:  This member was recently deemed Unable to Reach. If you have another number available for the member, please email it to me and I will send it to the [facility] CM.  I emailed her back with the 2 phone numbers we have on record for the patient and instructed her to let me know if they are not able to reach him with those numbers and I can ask him to call them

## 2023-12-22 NOTE — Telephone Encounter (Signed)
 I spoke to the patient and he said he has not heard from [facility] about the PCS yet. I gave him the phone number to call [employer] to check on the referral status and I also emailed Julius Ohs, [facility] LTSS requesting an update.

## 2023-12-26 NOTE — Telephone Encounter (Signed)
 Message received from Julius Ohs, [place], recommending I contact patient's care manager, Jeraldene Moloney about PCS. Email then sent to Jeraldene Moloney inquiring if she has been able to reach the patient.

## 2024-01-04 NOTE — Telephone Encounter (Signed)
 I spoke to the patient and he said he never heard from [facility] case Production designer, theatre/television/film.  I then sent an email to Ms Deetta Farrow and it was returned as undeliverable. I then sent an email to [email or web address] requesting assistance with helping this patient get his PCS.

## 2024-01-05 NOTE — Telephone Encounter (Signed)
 Voicemail message received from a representative from [place]: [phone] stating she has not heard anything about PCS yet as he has not been assigned an assessor. The message also stated that she may hear more next week.

## 2024-01-05 NOTE — Telephone Encounter (Signed)
 Email sent to [facility] requesting an update on referral status

## 2024-01-12 NOTE — Telephone Encounter (Signed)
 I spoke to Baxter, [place]: [phone] and inquired about the status of the request for PCS.  She said she will reach out to his assessor, if an assessor has been assigned, and check on the status of this request.  She also said she will reach out to the patient and she confirmed his phone number.  In addition, she stated she would call me back when she has more information about the [facility] referral.

## 2024-01-17 ENCOUNTER — Encounter: Payer: Self-pay | Admitting: Podiatry

## 2024-01-17 ENCOUNTER — Ambulatory Visit (INDEPENDENT_AMBULATORY_CARE_PROVIDER_SITE_OTHER): Payer: MEDICAID | Admitting: Podiatry

## 2024-01-17 DIAGNOSIS — I739 Peripheral vascular disease, unspecified: Secondary | ICD-10-CM

## 2024-01-17 DIAGNOSIS — L603 Nail dystrophy: Secondary | ICD-10-CM | POA: Diagnosis not present

## 2024-01-17 DIAGNOSIS — L84 Corns and callosities: Secondary | ICD-10-CM

## 2024-01-17 NOTE — Progress Notes (Signed)
 Patient presents for evaluation and treatment of tenderness around nails feet and hyperkeratotic lesions.   Physical exam:  General appearance: Alert, pleasant, and in no acute distress  Vascular: Pedal pulses: DP 2/4 b/l, PT 0/4 b/l.  Minimal edema lower legs bilaterally  Neurological:  No paresthesias or burning noted  Dermatologic:  Hyperkeratotic lesions plantar fifth MTP bilaterally, plantar heel right, and plantar first MTPJ left.  Thick dystrophic nails 1 through 5 bilaterally. Skin thinned.  Decreased hair lower limbs BL.  Skin hyperpigmentation lower legs BL.  Musculoskeletal:     Diagnosis: 1.  Painful hyperkeratotic lesions feet bilaterally. 2.  Painful dystrophic nails 1 through 5 bilaterally 3.  PAD/PVD  4.  Q8  Plan: -Debrided hyperkeratotic lesions feet x 4. -Debrided dystrophic nails 1 through 5  Return 3 months RFC

## 2024-01-25 NOTE — Telephone Encounter (Signed)
 Call placed to [facility] to inquire about the status of the [facility] request. I had to leave a message requesting a call back

## 2024-01-30 NOTE — Telephone Encounter (Signed)
 I tried to reach Logan Bailey, Logan Bailey again and her message stated she is out of the office through 02/03/2024 and to call the LTSS Supervisor, Logan Bailey: [phone] with any questions.   I called the number for Logan as noted and [employer] answered and said Logan was not available to speak with me and she would send him a message requesting a call back.   Call reference # [id number]

## 2024-02-03 ENCOUNTER — Telehealth (INDEPENDENT_AMBULATORY_CARE_PROVIDER_SITE_OTHER): Payer: Self-pay | Admitting: Primary Care

## 2024-02-03 NOTE — Telephone Encounter (Signed)
 Pt came in stating that paperwork was not filled out correctly. Paperwork fee was already taken previously.

## 2024-02-06 ENCOUNTER — Telehealth (INDEPENDENT_AMBULATORY_CARE_PROVIDER_SITE_OTHER): Payer: Self-pay

## 2024-02-06 NOTE — Telephone Encounter (Unsigned)
 Received new pcs form for pt. Pt states form was filled out wrong previously Will scan email form Will forward to Jane/Cassandra for assistance

## 2024-02-09 NOTE — Telephone Encounter (Addendum)
 Call to Shona Chiles, with [employer] VM left.

## 2024-02-20 NOTE — Telephone Encounter (Signed)
 Patient has an appointment with provider 02/21/2024

## 2024-02-21 ENCOUNTER — Encounter (INDEPENDENT_AMBULATORY_CARE_PROVIDER_SITE_OTHER): Payer: Self-pay | Admitting: Primary Care

## 2024-02-21 ENCOUNTER — Ambulatory Visit (INDEPENDENT_AMBULATORY_CARE_PROVIDER_SITE_OTHER): Payer: MEDICAID | Admitting: Primary Care

## 2024-02-21 VITALS — BP 120/78 | HR 60 | Resp 16 | Wt 183.6 lb

## 2024-02-21 DIAGNOSIS — H6123 Impacted cerumen, bilateral: Secondary | ICD-10-CM

## 2024-02-21 DIAGNOSIS — K089 Disorder of teeth and supporting structures, unspecified: Secondary | ICD-10-CM | POA: Diagnosis not present

## 2024-02-21 DIAGNOSIS — E782 Mixed hyperlipidemia: Secondary | ICD-10-CM

## 2024-02-21 DIAGNOSIS — M25512 Pain in left shoulder: Secondary | ICD-10-CM

## 2024-02-21 NOTE — Progress Notes (Signed)
 [facility]  Logan Bailey, is a 63 y.o. male  RDW:[id number]  FMW:[id number]  DOB - 11-10-1960  Chief Complaint  Patient presents with   Hyperlipidemia       Subjective:   Logan Bailey is a 63 y.o. male here today for an acute visit.  Patient blood pressure is well-controlled.  He presents today for referral for a dentist.  He has poor dentition on his left side of his mouth upper  tooth that have broken and needs to be removed.  Painful and sensitive to temperature changes and chewing.  Which decreases his intake. He also reports a concern about shoulder pain on the left side.  Denies any falls, trauma question if it could be arthritis very likely.  Has been using over-the-counter Biofreeze that gives him relief..  No problems updated.  Comprehensive ROS Pertinent positive and negative noted in HPI   Allergies  Allergen Reactions   Bee Venom Anaphylaxis and Hives    Past Medical History:  Diagnosis Date   Depression    GERD (gastroesophageal reflux disease)    Seasonal allergies     Current Outpatient Medications on File Prior to Visit  Medication Sig Dispense Refill   EPINEPHrine  0.3 mg/0.3 mL IJ SOAJ injection Inject 0.3 mLs (0.3 mg total) into the muscle as needed for anaphylaxis. 1 each 0   gabapentin  (NEURONTIN ) 300 MG capsule take 1 capsule (300 mg total) by mouth three times daily 90 capsule 2   gabapentin  (NEURONTIN ) 300 MG capsule Take 1 capsule (300 mg total) by mouth 3 (three) times daily. 90 capsule 0   gabapentin  (NEURONTIN ) 300 MG capsule Take 1 capsule (300 mg total) by mouth 3 (three) times daily as needed. 90 capsule 2   lumateperone  tosylate (CAPLYTA ) 42 MG capsule Take 1 capsule (42 mg total) by mouth daily as directed. 30 capsule 2   omeprazole  (PRILOSEC) 40 MG capsule Take 1 capsule (40 mg total) by mouth daily. 30 capsule 5   PERSERIS  90 MG PRSY SMARTSIG:1 Milligram(s) IM Every 4 Weeks     polyethylene glycol powder  (GLYCOLAX /MIRALAX ) 17 GM/SCOOP powder TAKE 17 GRAMS BY MOUTH DAILY (Patient not taking: Reported on 01/17/2024) 510 g 2   pravastatin  (PRAVACHOL ) 20 MG tablet TAKE 1 TABLET (20 MG TOTAL) BY MOUTH DAILY. 90 tablet 1   risperiDONE  ER (PERSERIS ) 90 MG PRSY Inject 1 mg by Intramuscularly route every 4 weeks. 1 each 2   risperiDONE  ER (PERSERIS ) 90 MG PRSY Inject 1 mg intramuscularly every four weeks as directed 1 each 2   risperiDONE  ER (PERSERIS ) 90 MG PRSY Inject 1 mg into the muscle every every four weeks as directed 1 each 2   [DISCONTINUED] buPROPion  (WELLBUTRIN  XL) 150 MG 24 hr tablet Take 1 tablet (150 mg total) by mouth daily. For smoking cessation 90 tablet 1   [DISCONTINUED] famotidine  (PEPCID ) 20 MG tablet Take 1 tablet (20 mg total) by mouth 2 (two) times daily. (Patient not taking: Reported on 10/01/2020) 30 tablet 0   No current facility-administered medications on file prior to visit.   Health Maintenance  Topic Date Due   COVID-19 Vaccine (1) Never done   Screening for Lung Cancer  12/16/2023   Flu Shot  03/09/2024   Cologuard (Stool DNA test)  02/18/2025   DTaP/Tdap/Td vaccine (2 - Td or Tdap) 09/14/2027   Pneumococcal Vaccination  Completed   Hepatitis C Screening  Completed   HIV Screening  Completed   Zoster (Shingles) Vaccine  Completed  Hepatitis B Vaccine  Aged Out   HPV Vaccine  Aged Out   Meningitis B Vaccine  Aged Out    Objective:   Vitals:   02/21/24 1552  BP: 120/78  Pulse: 60  Resp: 16  SpO2: 100%  Weight: 183 lb 9.6 oz (83.3 kg)   BP Readings from Last 3 Encounters:  02/21/24 120/78  11/07/23 109/71  08/08/23 127/85      Physical Exam Vitals reviewed.  Constitutional:      Appearance: Normal appearance. He is normal weight.  HENT:     Head: Normocephalic.     Right Ear: Ear canal and external ear normal. There is impacted cerumen.     Left Ear: Ear canal and external ear normal. There is impacted cerumen.     Nose: Nose normal.  Eyes:      Extraocular Movements: Extraocular movements intact.  Cardiovascular:     Rate and Rhythm: Normal rate and regular rhythm.  Pulmonary:     Effort: Pulmonary effort is normal.     Breath sounds: Normal breath sounds.  Abdominal:     General: Bowel sounds are normal. There is distension.     Palpations: Abdomen is soft.  Musculoskeletal:        General: Normal range of motion.     Cervical back: Normal range of motion.  Skin:    General: Skin is warm and dry.  Neurological:     Mental Status: He is alert and oriented to person, place, and time.  Psychiatric:        Mood and Affect: Mood normal.        Behavior: Behavior normal.        Thought Content: Thought content normal.        Judgment: Judgment normal.     Assessment & Plan  Izen was seen today for hyperlipidemia.  Diagnoses and all orders for this visit:  Poor dentition -     Ambulatory referral to Dentistry  Mixed hyperlipidemia -     Lipid panel  Acute pain of left shoulder Continue bio freeze full ROM  Bilateral impacted cerumen Removed with curetted tolerated well     Patient have been counseled extensively about nutrition and exercise. Other issues discussed during this visit include: low cholesterol diet, weight control and daily exercise, foot care, annual eye examinations at Ophthalmology, importance of adherence with medications and regular follow-up. We also discussed long term complications of uncontrolled diabetes and hypertension.   Return in about 3 months (around 05/23/2024) for medical conditions, fasting labs.  The patient was given clear instructions to go to ER or return to medical center if symptoms don't improve, worsen or new problems develop. The patient verbalized understanding. The patient was told to call to get lab results if they haven't heard anything in the next week.   This note has been created with Education officer, environmental. Any transcriptional  errors are unintentional.   Logan SHAUNNA Bohr, NP 02/21/2024, 4:06 PM

## 2024-02-22 LAB — LIPID PANEL
Chol/HDL Ratio: 6.4 ratio — ABNORMAL HIGH (ref 0.0–5.0)
Cholesterol, Total: 205 mg/dL — ABNORMAL HIGH (ref 100–199)
HDL: 32 mg/dL — ABNORMAL LOW (ref >39)
LDL Chol Calc (NIH): 144 mg/dL — ABNORMAL HIGH (ref 0–99)
Triglycerides: 157 mg/dL — ABNORMAL HIGH (ref 0–149)
VLDL Cholesterol Cal: 29 mg/dL (ref 5–40)

## 2024-02-24 ENCOUNTER — Ambulatory Visit: Payer: Self-pay | Admitting: Primary Care

## 2024-02-24 DIAGNOSIS — E782 Mixed hyperlipidemia: Secondary | ICD-10-CM

## 2024-02-24 MED ORDER — ATORVASTATIN CALCIUM 40 MG PO TABS: 40.0000 mg | ORAL_TABLET | Freq: Every day | ORAL | 1 refills | Status: DC

## 2024-03-01 NOTE — Telephone Encounter (Signed)
 Calton Daring, RN stated she is working on the [facility] request

## 2024-03-12 NOTE — Telephone Encounter (Signed)
 Completed PCS request emailed to [email or web address]

## 2024-03-12 NOTE — Telephone Encounter (Signed)
 Completed PCS referral emailed to [email or web address].

## 2024-03-16 ENCOUNTER — Telehealth (INDEPENDENT_AMBULATORY_CARE_PROVIDER_SITE_OTHER): Payer: Self-pay | Admitting: Primary Care

## 2024-03-16 NOTE — Telephone Encounter (Signed)
 Copied from CRM #[id number]. Topic: Referral - Question >> Mar 16, 2024  3:04 PM Montie POUR wrote: Reason for CRM:  Logan Bailey is trying to find out about the referral to: [facility])  [address] [place] [address]  Phone: [phone] Fax: [phone] The chart shows the referral has been closed out. Please resend referral.  Please call [name] at [phone] with questions.

## 2024-03-23 NOTE — Telephone Encounter (Signed)
 Can we resend his referral.

## 2024-04-12 ENCOUNTER — Telehealth: Payer: Self-pay

## 2024-04-12 ENCOUNTER — Telehealth: Payer: Self-pay | Admitting: Primary Care

## 2024-04-12 NOTE — Telephone Encounter (Signed)
 Copied from CRM [id number]. Topic: General - Other >> Apr 12, 2024  4:18 PM Wess RAMAN wrote:  Reason for CRM:  Patient states [facility] stated he needed to provide the following website to Celestia Browning, NP to check the status of his home healthcare.  Website: [email or web address]  Patient Callback #: [phone]

## 2024-04-12 NOTE — Telephone Encounter (Signed)
 Fax received from [facility] with [phone] request for PCS.   I called the patient to inquire if he has received a call from [facility] about scheduling a home assessment and he said no. I explained to him that I submitted a 3051 (PCS request)  to [facility] on 03/12/2024 and I received a message back from [facility] on 03/12/2024 stating that the 3051 was received. I instructed him to call [facility] member services and request the home assessment for PCS.   I also explained to him that we do not send the [facility] requests to [facility] because they are not doing the assessments. The PCS requests go to the insurance company who will schedule the assessments. He said he will call [employer].

## 2024-04-13 NOTE — Telephone Encounter (Signed)
 Noted. Not a working or accessible website or it is unable to be pulled up on [facility] network.

## 2024-04-18 ENCOUNTER — Ambulatory Visit (INDEPENDENT_AMBULATORY_CARE_PROVIDER_SITE_OTHER): Payer: MEDICAID | Admitting: Podiatry

## 2024-04-18 ENCOUNTER — Encounter: Payer: Self-pay | Admitting: Podiatry

## 2024-04-18 DIAGNOSIS — M79672 Pain in left foot: Secondary | ICD-10-CM

## 2024-04-18 DIAGNOSIS — M79671 Pain in right foot: Secondary | ICD-10-CM | POA: Diagnosis not present

## 2024-04-18 DIAGNOSIS — B351 Tinea unguium: Secondary | ICD-10-CM | POA: Diagnosis not present

## 2024-04-18 NOTE — Progress Notes (Signed)
 Patient presents for evaluation and treatment of tenderness and some redness around nails feet.  Tenderness around toes with walking and wearing shoes.  Physical exam:  General appearance: Alert, pleasant, and in no acute distress.  Vascular: Pedal pulses: DP 2/4 B/L, PT 0/4 B/L. Mild edema lower legs bilaterally  Neu  Dermatologic:  Nails thickened, disfigured, discolored 1-5 BL with subungual debris.  Redness and hypertrophic nail folds along nail folds bilaterally but no signs of drainage or infection.  Musculoskeletal:     Diagnosis: 1. Painful onychomycotic nails 1 through 5 bilaterally. 2. Pain toes 1 through 5 bilaterally.  Plan: -Debrided onychomycotic nails 1 through 5 bilaterally.  Sharply debrided nails with nail clipper and reduced with a power bur.  Return 3 months RFC

## 2024-04-19 NOTE — Telephone Encounter (Signed)
 Message received from [employer] LTSS stating to contact Ms. Logan Bailey/ [facility] about the services, LTSS has received the [facility] referral - 3051.  I called the patient and he said he still has not heard anything about the [facility] services.  I called Ms. Logan Bailey and she said she knows the patient was assessed for services on 03/21/2024 but they have not received the authorization form from [employer] to proceed with providing services.  Ms. Bailey asked that I reach out to [employer] again.  Email sent to [facility] informing them that I spoke to Ms. Bailey and she is waiting on an authorization form to proceed with servicing the patient.

## 2024-04-20 NOTE — Telephone Encounter (Addendum)
 Noted  Attempt to call [facility] provider line at [phone] recommended to call this number: [phone] Spk with [facility] with Provider Services Provider NPI [id number] Reference number : [phone] Can email for verification-  [email or web address]

## 2024-04-23 NOTE — Telephone Encounter (Signed)
 The following response was received from [facility] regarding the [facility] request:  Good afternoon,  As of today, if providers have questions about authorization or assessment status, they will need to email those directly to [facility].  Please see contact information below.  Please email questions to this email address:  [email or web address]  Thanks,  April [place], [place] LTSS Triage Coordinator     I called Logan [facility]: [phone] and shared this message with her.  She said she has the email address and will email them today requesting the information that she needs.  She also stated she will call me with an update when she hears back from [employer]

## 2024-05-01 ENCOUNTER — Telehealth (INDEPENDENT_AMBULATORY_CARE_PROVIDER_SITE_OTHER): Payer: Self-pay

## 2024-05-01 NOTE — Telephone Encounter (Signed)
 Call received from [facility] stating she needs an FL2 for the patient so he can continue to receive his Special Assistance In Home, she was not calling regarding the PCS request.    She said the [facility] # 10 needs to state home and # 11 needs to state domiciliary.   The signed document can be emailed to her: [email or web address]

## 2024-05-01 NOTE — Telephone Encounter (Signed)
 Call returned to Logan Bailey/[employer] and I had to leave a message requesting a call back.   I need to speak to her and clarify what she is referring to. Is it PCS?  There seems to have been a lot of confusion with his PCS referral.

## 2024-05-01 NOTE — Telephone Encounter (Signed)
 Noted.  Copied from CRM #[id number]. Topic: Clinical - Medical Advice >> May 01, 2024 11:51 AM Tiffini S wrote: Reason for CRM: [name] with [facility] [phone] stated she needed to fax a form to  Celestia Browning, NP to check the status of his home order Will provide email email address

## 2024-05-02 NOTE — Telephone Encounter (Signed)
 I called the patient to inquire if his PCS services have started; but he didn't answer and the voicemail was not set up

## 2024-05-03 NOTE — Telephone Encounter (Signed)
 I spoke to patient and he said he is still waiting for PCS.

## 2024-05-08 NOTE — Telephone Encounter (Signed)
 I spoke to [employer] [facility] and she said she emailed :  [email or web address] about the patient's PCS and has not heard back from anyone.   I told her that I will send an email also requesting an update on the status of the PCS request.  I then sent an Email to  [email or web address]

## 2024-05-09 ENCOUNTER — Ambulatory Visit (INDEPENDENT_AMBULATORY_CARE_PROVIDER_SITE_OTHER): Payer: MEDICAID | Admitting: Primary Care

## 2024-05-09 NOTE — Telephone Encounter (Signed)
 Message received from Rollen Finder, [place] stating she called the patient and scheduled him for the [facility] assessment 05/23/2024 around 2:30 pm

## 2024-05-09 NOTE — Telephone Encounter (Signed)
 I received the following response from [facility]:  Hello,  Thank you for your inquiry.  Per the documentation, the member was scheduled to complete their assessment on September 2nd. However, when the [facility] attempted to contact the member to confirm the appointment, they were unable to reach them.  Could you please confirm the best contact number for the member so we can proceed with rescheduling?  Kind Regards, Rollen Rollen M. Vannie ROBINS, MA Care Navigator - Tailored Plan Case Management [facility]     I then provided [facility] with patient's number: [phone].  I also called the patient and told him to expect a call from [facility] to schedule the evaluation for PCS. I instructed him to please check his missed calls and voicemail messages to make sure he is able to speak with the [employer] representative when they call him and he said he understood.  I told him to please call me if he has not heard from [facility] in the next week. I also told him that he needs to speak to [facility] about this and not [facility]  and again he said he understood

## 2024-05-10 NOTE — Telephone Encounter (Signed)
 I called the patient and he confirmed that he has the [facility] evaluation on 05/23/2024.

## 2024-05-14 NOTE — Telephone Encounter (Signed)
 Signed FL2 emailed to Chrystine Nine, [place] as requested

## 2024-05-23 ENCOUNTER — Encounter (INDEPENDENT_AMBULATORY_CARE_PROVIDER_SITE_OTHER): Payer: Self-pay | Admitting: Primary Care

## 2024-05-23 ENCOUNTER — Ambulatory Visit (INDEPENDENT_AMBULATORY_CARE_PROVIDER_SITE_OTHER): Payer: MEDICAID | Admitting: Primary Care

## 2024-05-23 VITALS — BP 128/71 | HR 60 | Resp 16 | Wt 179.6 lb

## 2024-05-23 DIAGNOSIS — Z23 Encounter for immunization: Secondary | ICD-10-CM

## 2024-05-23 DIAGNOSIS — E782 Mixed hyperlipidemia: Secondary | ICD-10-CM

## 2024-05-23 DIAGNOSIS — Z79899 Other long term (current) drug therapy: Secondary | ICD-10-CM

## 2024-05-23 NOTE — Patient Instructions (Signed)

## 2024-05-23 NOTE — Progress Notes (Signed)
 [facility]  Logan Bailey, is a 63 y.o. male  RDW:[id number]  FMW:[id number]  DOB - 1961-07-19  No chief complaint on file.      Subjective:   Logan Bailey is a 63 y.o. male here today for a follow up visit. Patient has No headache, No chest pain, No abdominal pain - No Nausea, No new weakness tingling or numbness, No Cough - shortness of breath HPI  No problems updated.  Comprehensive ROS Pertinent positive and negative noted in HPI   Allergies  Allergen Reactions   Bee Venom Anaphylaxis and Hives    Past Medical History:  Diagnosis Date   Depression    GERD (gastroesophageal reflux disease)    Seasonal allergies     Current Outpatient Medications on File Prior to Visit  Medication Sig Dispense Refill   atorvastatin  (LIPITOR) 40 MG tablet Take 1 tablet (40 mg total) by mouth daily. 90 tablet 1   EPINEPHrine  0.3 mg/0.3 mL IJ SOAJ injection Inject 0.3 mLs (0.3 mg total) into the muscle as needed for anaphylaxis. 1 each 0   gabapentin  (NEURONTIN ) 300 MG capsule take 1 capsule (300 mg total) by mouth three times daily 90 capsule 2   gabapentin  (NEURONTIN ) 300 MG capsule Take 1 capsule (300 mg total) by mouth 3 (three) times daily. 90 capsule 0   gabapentin  (NEURONTIN ) 300 MG capsule Take 1 capsule (300 mg total) by mouth 3 (three) times daily as needed. 90 capsule 2   lumateperone  tosylate (CAPLYTA ) 42 MG capsule Take 1 capsule (42 mg total) by mouth daily as directed. 30 capsule 2   omeprazole  (PRILOSEC) 40 MG capsule Take 1 capsule (40 mg total) by mouth daily. 30 capsule 5   PERSERIS  90 MG PRSY SMARTSIG:1 Milligram(s) IM Every 4 Weeks     polyethylene glycol powder (GLYCOLAX /MIRALAX ) 17 GM/SCOOP powder TAKE 17 GRAMS BY MOUTH DAILY (Patient not taking: Reported on 04/18/2024) 510 g 2   risperiDONE  ER (PERSERIS ) 90 MG PRSY Inject 1 mg by Intramuscularly route every 4 weeks. 1 each 2   risperiDONE  ER (PERSERIS ) 90 MG PRSY Inject 1 mg intramuscularly every  four weeks as directed 1 each 2   risperiDONE  ER (PERSERIS ) 90 MG PRSY Inject 1 mg into the muscle every every four weeks as directed 1 each 2   [DISCONTINUED] buPROPion  (WELLBUTRIN  XL) 150 MG 24 hr tablet Take 1 tablet (150 mg total) by mouth daily. For smoking cessation 90 tablet 1   [DISCONTINUED] famotidine  (PEPCID ) 20 MG tablet Take 1 tablet (20 mg total) by mouth 2 (two) times daily. (Patient not taking: Reported on 10/01/2020) 30 tablet 0   No current facility-administered medications on file prior to visit.   Health Maintenance  Topic Date Due   COVID-19 Vaccine (1) Never done   Screening for Lung Cancer  12/16/2023   Flu Shot  03/09/2024   Cologuard (Stool DNA test)  02/18/2025   DTaP/Tdap/Td vaccine (2 - Td or Tdap) 09/14/2027   Pneumococcal Vaccine for age over 39  Completed   Hepatitis C Screening  Completed   HIV Screening  Completed   Zoster (Shingles) Vaccine  Completed   Hepatitis B Vaccine  Aged Out   HPV Vaccine  Aged Out   Meningitis B Vaccine  Aged Out    Objective:  There were no vitals filed for this visit.   Physical Exam Vitals reviewed.  Constitutional:      Appearance: Normal appearance.  HENT:     Head: Normocephalic.  Right Ear: Tympanic membrane, ear canal and external ear normal.     Left Ear: Tympanic membrane, ear canal and external ear normal.     Nose: Nose normal.  Eyes:     Extraocular Movements: Extraocular movements intact.     Pupils: Pupils are equal, round, and reactive to light.  Cardiovascular:     Rate and Rhythm: Normal rate and regular rhythm.  Pulmonary:     Effort: Pulmonary effort is normal.     Breath sounds: Normal breath sounds.  Abdominal:     General: Bowel sounds are normal. There is distension.     Palpations: Abdomen is soft.  Musculoskeletal:        General: Normal range of motion.  Skin:    General: Skin is warm and dry.  Neurological:     Mental Status: He is alert and oriented to person, place, and time.   Psychiatric:        Mood and Affect: Mood normal.        Behavior: Behavior normal.        Thought Content: Thought content normal.        Judgment: Judgment normal.       Assessment & Plan  Jerald was seen today for hyperlipidemia.  Diagnoses and all orders for this visit:  Mixed hyperlipidemia -     Lipid Panel  Medication management -     CMP14+EGFR -     CBC with Differential  Encounter for immunization -     Flu vaccine trivalent PF, 6mos and older(Flulaval,Afluria,Fluarix,Fluzone)     Patient have been counseled extensively about nutrition and exercise. Other issues discussed during this visit include: low cholesterol diet, weight control and daily exercise, foot care, annual eye examinations at Ophthalmology, importance of adherence with medications and regular follow-up. We also discussed long term complications of uncontrolled diabetes and hypertension.   No follow-ups on file.  The patient was given clear instructions to go to ER or return to medical center if symptoms don't improve, worsen or new problems develop. The patient verbalized understanding. The patient was told to call to get lab results if they haven't heard anything in the next week.   This note has been created with Education officer, environmental. Any transcriptional errors are unintentional.   Logan SHAUNNA Bohr, NP 05/23/2024, 12:05 PM

## 2024-05-24 LAB — CBC WITH DIFFERENTIAL/PLATELET
Basophils Absolute: 0 x10E3/uL (ref 0.0–0.2)
Basos: 0 %
EOS (ABSOLUTE): 0.1 x10E3/uL (ref 0.0–0.4)
Eos: 1 %
Hematocrit: 43.9 % (ref 37.5–51.0)
Hemoglobin: 13.9 g/dL (ref 13.0–17.7)
Immature Grans (Abs): 0 x10E3/uL (ref 0.0–0.1)
Immature Granulocytes: 0 %
Lymphocytes Absolute: 2.1 x10E3/uL (ref 0.7–3.1)
Lymphs: 38 %
MCH: 29.8 pg (ref 26.6–33.0)
MCHC: 31.7 g/dL (ref 31.5–35.7)
MCV: 94 fL (ref 79–97)
Monocytes Absolute: 0.6 x10E3/uL (ref 0.1–0.9)
Monocytes: 10 %
Neutrophils Absolute: 2.7 x10E3/uL (ref 1.4–7.0)
Neutrophils: 51 %
Platelets: 258 x10E3/uL (ref 150–450)
RBC: 4.67 x10E6/uL (ref 4.14–5.80)
RDW: 13 % (ref 11.6–15.4)
WBC: 5.4 x10E3/uL (ref 3.4–10.8)

## 2024-05-24 LAB — CMP14+EGFR
ALT: 45 IU/L — ABNORMAL HIGH (ref 0–44)
AST: 34 IU/L (ref 0–40)
Albumin: 4.3 g/dL (ref 3.9–4.9)
Alkaline Phosphatase: 74 IU/L (ref 47–123)
BUN/Creatinine Ratio: 15 (ref 10–24)
BUN: 14 mg/dL (ref 8–27)
Bilirubin Total: 0.3 mg/dL (ref 0.0–1.2)
CO2: 24 mmol/L (ref 20–29)
Calcium: 9.6 mg/dL (ref 8.6–10.2)
Chloride: 103 mmol/L (ref 96–106)
Creatinine, Ser: 0.94 mg/dL (ref 0.76–1.27)
Globulin, Total: 2.9 g/dL (ref 1.5–4.5)
Glucose: 54 mg/dL — ABNORMAL LOW (ref 70–99)
Potassium: 4.7 mmol/L (ref 3.5–5.2)
Sodium: 140 mmol/L (ref 134–144)
Total Protein: 7.2 g/dL (ref 6.0–8.5)
eGFR: 92 mL/min/1.73 (ref >59)

## 2024-05-24 LAB — LIPID PANEL
Chol/HDL Ratio: 3.1 ratio (ref 0.0–5.0)
Cholesterol, Total: 148 mg/dL (ref 100–199)
HDL: 47 mg/dL (ref >39)
LDL Chol Calc (NIH): 85 mg/dL (ref 0–99)
Triglycerides: 83 mg/dL (ref 0–149)
VLDL Cholesterol Cal: 16 mg/dL (ref 5–40)

## 2024-06-04 ENCOUNTER — Ambulatory Visit (INDEPENDENT_AMBULATORY_CARE_PROVIDER_SITE_OTHER): Payer: Self-pay | Admitting: Primary Care

## 2024-07-02 NOTE — Telephone Encounter (Signed)
 Message received from Porshe/ [facility] requesting an update on the status of patient's PCS request. Call back : [phone].  I called the patient and he said someone came out to evaluate him but he has not heard anything else.

## 2024-07-02 NOTE — Telephone Encounter (Signed)
 I spoke to [employer]: [phone] and she was not able to assist me with determining the status of the [facility] referral.  She referred me to their website for provider inquiry or [employer]: [phone].  I then spoke to [employer] who transferred me to Beverley POUR, patient's [employer] care manager.: [phone]. He stated he is taking over as the patient's care manager as his prior care manager is no longer there.  I explained the frustrations with trying to secure PCS for the patient.  He said he will have to look into the case more and make some calls. He stated that he can see that the patient has been authorized for [facility] through 10/2024 with [facility].  He said he will follow up with that agency as well as the patient and I asked that he call me with an update and he said he would .

## 2024-07-11 NOTE — Telephone Encounter (Signed)
 I spoke to Tiara/ [facility] and left a message for Portia informing her that Logan Bailey has approved his PCS and they will be reaching out to the patient. I left my call back number if Garrie has any questions.

## 2024-07-12 NOTE — Telephone Encounter (Signed)
 I spoke to Portia/ [facility] and she said that she does not understand how the patient is assigned to another agency to provide services because she has been working with him since June.  I told her that I do not know how he is assigned an agency, because the patients are allowed to choose  I explained to her that he just needs to call [facility] and request [facility].  She said she will call him and possibly do a 3 way call with [employer].

## 2024-07-17 ENCOUNTER — Telehealth: Payer: Self-pay | Admitting: Primary Care

## 2024-07-17 NOTE — Telephone Encounter (Signed)
 Call returned to Portia/ [facility].  She said she thinks that she almost has the patient set up with her agency but may need another 3051 done. I told her that we have not had to do that before when a person just wants to change home care agencies. She said she will check into it more and will get back to me.

## 2024-07-17 NOTE — Telephone Encounter (Signed)
 Copied from CRM #[id number]. Topic: General - Call Back - No Documentation >> Jul 17, 2024 12:09 PM Berwyn MATSU wrote:  Reason for CRM:  Patient and Cherise agency rep called in requesting to speak to Slater or [employer]anner. Per patient they need a call back.  This is in regards to Personal Care Service.   CB: [phone]  May you please assist.

## 2024-07-18 ENCOUNTER — Ambulatory Visit: Payer: MEDICAID | Admitting: Podiatry

## 2024-07-18 ENCOUNTER — Encounter: Payer: Self-pay | Admitting: Podiatry

## 2024-07-18 DIAGNOSIS — M79671 Pain in right foot: Secondary | ICD-10-CM

## 2024-07-18 DIAGNOSIS — M79672 Pain in left foot: Secondary | ICD-10-CM

## 2024-07-18 DIAGNOSIS — B351 Tinea unguium: Secondary | ICD-10-CM | POA: Diagnosis not present

## 2024-07-18 NOTE — Progress Notes (Signed)
 Patient presents for evaluation and treatment of tenderness and some redness around nails feet.  Tenderness around toes with walking and wearing shoes.  Physical exam:  General appearance: Alert, pleasant, and in no acute distress.  Vascular: Pedal pulses: DP 2/4 B/L, PT 0/4 B/L. Mild edema lower legs bilaterally  Neu  Dermatologic:  Nails thickened, disfigured, discolored 1-5 BL with subungual debris.  Redness and hypertrophic nail folds along nail folds bilaterally but no signs of drainage or infection.  Musculoskeletal:     Diagnosis: 1. Painful onychomycotic nails 1 through 5 bilaterally. 2. Pain toes 1 through 5 bilaterally.  Plan: -Debrided onychomycotic nails 1 through 5 bilaterally.  Sharply debrided nails with nail clipper and reduced with a power bur.  Return 3 months RFC

## 2024-07-27 ENCOUNTER — Telehealth (INDEPENDENT_AMBULATORY_CARE_PROVIDER_SITE_OTHER): Payer: Self-pay

## 2024-07-27 NOTE — Telephone Encounter (Signed)
 Received a fax from [facility] in regards to pt requesting pcs. I have forwarded paperwork to our Case Manager

## 2024-07-31 ENCOUNTER — Other Ambulatory Visit: Payer: Self-pay | Admitting: Primary Care

## 2024-07-31 ENCOUNTER — Other Ambulatory Visit (INDEPENDENT_AMBULATORY_CARE_PROVIDER_SITE_OTHER): Payer: Self-pay

## 2024-07-31 DIAGNOSIS — E782 Mixed hyperlipidemia: Secondary | ICD-10-CM

## 2024-07-31 MED ORDER — ATORVASTATIN CALCIUM 40 MG PO TABS: 40.0000 mg | ORAL_TABLET | Freq: Every day | ORAL | 1 refills | Status: AC

## 2024-07-31 NOTE — Telephone Encounter (Signed)
 Copied from CRM [id number]. Topic: General - Call Back - No Documentation >> Jul 31, 2024 11:04 AM Olam RAMAN wrote:  Reason for CRM: caller calling for [facility], Has inforamtion for pt. CB: [phone] Did not want to tell me more info  >> Jul 31, 2024 11:26 AM Pinkey ORN wrote:  [facility] [phone] Returning a call from Marvis Slater, RN on behalf of patient.

## 2024-07-31 NOTE — Telephone Encounter (Signed)
 I spoke to Portia/ [facility] and she explained that she contacted [facility] and sent them page 3 of the 3051 with the change of provider name and [employer] informed her that a new 3051 is needed from the provider.

## 2024-08-03 NOTE — Telephone Encounter (Signed)
 I spoke to Portia/ [facility] and she explained that when she contacted [facility], she was told a new 3051 is needed and if it is not received in 5 business days, his services will be discontinued. I explained to her that I will contact [employer] again as this has not been the procedure when a patient wants to change agencies.  I also told her that I if needed, I can send a 3051 to PCP to sign.  I sent a message to [email or web address] and received a response from Rollen Finder: This member has an active authorization with [facility]. If the member would like to change their PCS provider, the Change of Provider sections of the DMA-3051 form must be completed and submitted to [email or web address].     I emailed Rollen back asking if the entire 3051 needs to be completed or if the home care agency can just submit the change request on page 3 of the 3051.

## 2024-08-03 NOTE — Telephone Encounter (Signed)
 The following response was received from Rollen Finder, Care Navigator with [facility]. :   A [id number] submitted by the member's PCP was received earlier this year and is already on file; a new submission is not needed at this time. As [employer] is the entity that processes these forms, please refer to them for questions regarding who may submit a Change of Provider (COP) request. Based on my experience, I have seen forms submitted and signed by [facility] providers and Tailored Care Managers.   Cassie- can you please call Portia/ [facility] and let her know about this message that I received regarding her PCS. The change of provider request is also part of the 3051 ; but the provider does not sign that form. She should be able to submit that request.   If a 3051 is needed, I have completed one and sent page 2 to [facility] for signature.  The form and visit notes are in my desk drawer if needed

## 2024-08-07 NOTE — Telephone Encounter (Signed)
 Called Cherise - [facility] at [phone] several times. Unable to reach or leave message (fast beep note when call has been placed)

## 2024-08-13 NOTE — Telephone Encounter (Signed)
 I spoke to Logan Bailey/ [facility] and shared the message from Logan Bailey, [facility] noting that a new 3015 is not needed from the provider.    The 3rd page of the 3051 can be completed by the [facility] provider with the change of agency request.    Logan Bailey said that she has already submitted that form and was notified it was not acceptable. I asked her to please re-send that request for change of PCS providers to [email or web address] and to cc me on the email.  She stated she would re-send the form and and include me on the email.

## 2024-08-22 ENCOUNTER — Telehealth (INDEPENDENT_AMBULATORY_CARE_PROVIDER_SITE_OTHER): Payer: Self-pay | Admitting: Primary Care

## 2024-08-22 NOTE — Telephone Encounter (Signed)
 I spoke to Portia/ [facility] and she confirmed that they are all set with PCS for the patient.  They have been approved to provide his services and she said he has a great male aide working with him.   She did not have any other questions/ concerns at this time

## 2024-08-22 NOTE — Telephone Encounter (Signed)
 Pt was unavailable and phone was not ringing.

## 2024-08-23 ENCOUNTER — Ambulatory Visit (INDEPENDENT_AMBULATORY_CARE_PROVIDER_SITE_OTHER): Payer: MEDICAID | Admitting: Primary Care

## 2024-08-23 ENCOUNTER — Encounter (INDEPENDENT_AMBULATORY_CARE_PROVIDER_SITE_OTHER): Payer: Self-pay | Admitting: Primary Care

## 2024-08-23 ENCOUNTER — Other Ambulatory Visit: Payer: Self-pay

## 2024-08-23 VITALS — BP 118/80 | HR 55 | Resp 16 | Ht 74.0 in | Wt 191.2 lb

## 2024-08-23 DIAGNOSIS — F17210 Nicotine dependence, cigarettes, uncomplicated: Secondary | ICD-10-CM

## 2024-08-23 DIAGNOSIS — F17200 Nicotine dependence, unspecified, uncomplicated: Secondary | ICD-10-CM

## 2024-08-23 DIAGNOSIS — J9801 Acute bronchospasm: Secondary | ICD-10-CM

## 2024-08-23 DIAGNOSIS — Z87891 Personal history of nicotine dependence: Secondary | ICD-10-CM

## 2024-08-23 DIAGNOSIS — H6123 Impacted cerumen, bilateral: Secondary | ICD-10-CM

## 2024-08-23 MED ORDER — ALBUTEROL SULFATE HFA 108 (90 BASE) MCG/ACT IN AERS: 2.0000 | INHALATION_SPRAY | Freq: Four times a day (QID) | RESPIRATORY_TRACT | 1 refills | Status: AC | PRN

## 2024-08-23 NOTE — Progress Notes (Signed)
 " [facility]  Logan Bailey, is a 64 y.o. male  RDW:[id number]  FMW:[id number]  DOB - 02-17-1961  Chief Complaint  Patient presents with   Hyperlipidemia       Subjective:   Logan Bailey is a 64 y.o. male here today for a follow up visit. Patient has No headache, No chest pain, No abdominal pain - No Nausea, No new weakness tingling or numbness, No Cough - shortness of breath Patient had a cold 2 weeks ago cold chills denies fever (but did not take temp) sinus irritating, cough  and nasal drainage with throat itching. Sister tx him with OTC Sinus medication and increase fluids resolved. No problems updated.  Comprehensive ROS Pertinent positive and negative noted in HPI   Allergies[1]  Past Medical History:  Diagnosis Date   Depression    GERD (gastroesophageal reflux disease)    Seasonal allergies     Medications Ordered Prior to Encounter[2] Health Maintenance  Topic Date Due   COVID-19 Vaccine (1) Never done   Screening for Lung Cancer  12/16/2023   Cologuard (Stool DNA test)  02/18/2025   DTaP/Tdap/Td vaccine (2 - Td or Tdap) 09/14/2027   Pneumococcal Vaccine for age over 94  Completed   Flu Shot  Completed   HPV Vaccine (No Doses Required) Completed   Hepatitis C Screening  Completed   HIV Screening  Completed   Zoster (Shingles) Vaccine  Completed   Hepatitis B Vaccine  Aged Out   Meningitis B Vaccine  Aged Out    Objective:   Vitals:   08/23/24 1032  BP: 118/80  Pulse: (!) 55  Resp: 16  SpO2: 98%  Weight: 191 lb 3.2 oz (86.7 kg)  Height: 6' 2 (1.88 m)   BP Readings from Last 3 Encounters:  08/23/24 118/80  05/23/24 128/71  02/21/24 120/78      Physical Exam Vitals reviewed.  Constitutional:      Appearance: Normal appearance. He is normal weight.  HENT:     Head: Normocephalic.     Right Ear: External ear normal. There is impacted cerumen.     Left Ear: External ear normal. There is impacted cerumen.     Nose: Nose  normal.  Eyes:     Extraocular Movements: Extraocular movements intact.     Pupils: Pupils are equal, round, and reactive to light.  Cardiovascular:     Rate and Rhythm: Normal rate and regular rhythm.  Pulmonary:     Effort: Pulmonary effort is normal.     Breath sounds: Wheezing present.  Abdominal:     General: Bowel sounds are normal.     Palpations: Abdomen is soft.  Musculoskeletal:        General: Normal range of motion.  Skin:    General: Skin is warm and dry.  Neurological:     Mental Status: He is alert and oriented to person, place, and time. Mental status is at baseline.  Psychiatric:        Mood and Affect: Mood normal.        Behavior: Behavior normal.        Thought Content: Thought content normal.        Judgment: Judgment normal.       Assessment & Plan  Carmon was seen today for hyperlipidemia.  Diagnoses and all orders for this visit:  Tobacco use disorder -     Ambulatory Referral for Lung Cancer Screening [REF832]   Bilateral impacted cerumen  Rtn for ear  irrigation  Bronchospasm -     albuterol  (VENTOLIN  HFA) 108 (90 Base) MCG/ACT inhaler; Inhale 2 puffs into the lungs every 6 (six) hours as needed for wheezing or shortness of breath.    Patient have been counseled extensively about nutrition and exercise. Other issues discussed during this visit include: low cholesterol diet, weight control and daily exercise, foot care, annual eye examinations at Ophthalmology, importance of adherence with medications and regular follow-up. We also discussed long term complications of uncontrolled diabetes and hypertension.   Return in about 3 months (around 11/21/2024).  The patient was given clear instructions to go to ER or return to medical center if symptoms don't improve, worsen or new problems develop. The patient verbalized understanding. The patient was told to call to get lab results if they haven't heard anything in the next week.   This note has been  created with Education officer, environmental. Any transcriptional errors are unintentional.   Logan SHAUNNA Bohr, NP 08/23/2024, 11:01 AM    [1]  Allergies Allergen Reactions   Bee Venom Anaphylaxis and Hives  [2]  Current Outpatient Medications on File Prior to Visit  Medication Sig Dispense Refill   atorvastatin  (LIPITOR) 40 MG tablet Take 1 tablet (40 mg total) by mouth daily. 90 tablet 1   EPINEPHrine  0.3 mg/0.3 mL IJ SOAJ injection Inject 0.3 mLs (0.3 mg total) into the muscle as needed for anaphylaxis. 1 each 0   gabapentin  (NEURONTIN ) 300 MG capsule take 1 capsule (300 mg total) by mouth three times daily 90 capsule 2   gabapentin  (NEURONTIN ) 300 MG capsule Take 1 capsule (300 mg total) by mouth 3 (three) times daily. 90 capsule 0   gabapentin  (NEURONTIN ) 300 MG capsule Take 1 capsule (300 mg total) by mouth 3 (three) times daily as needed. 90 capsule 2   lumateperone  tosylate (CAPLYTA ) 42 MG capsule Take 1 capsule (42 mg total) by mouth daily as directed. 30 capsule 2   omeprazole  (PRILOSEC) 40 MG capsule Take 1 capsule (40 mg total) by mouth daily. 30 capsule 5   PERSERIS  90 MG PRSY SMARTSIG:1 Milligram(s) IM Every 4 Weeks     risperiDONE  ER (PERSERIS ) 90 MG PRSY Inject 1 mg by Intramuscularly route every 4 weeks. 1 each 2   risperiDONE  ER (PERSERIS ) 90 MG PRSY Inject 1 mg intramuscularly every four weeks as directed 1 each 2   risperiDONE  ER (PERSERIS ) 90 MG PRSY Inject 1 mg into the muscle every every four weeks as directed 1 each 2   No current facility-administered medications on file prior to visit.   "

## 2024-09-13 ENCOUNTER — Telehealth (INDEPENDENT_AMBULATORY_CARE_PROVIDER_SITE_OTHER): Payer: Self-pay | Admitting: Primary Care

## 2024-09-13 NOTE — Telephone Encounter (Signed)
 Called pt to confirm appt.Pt did not answer and could not LVM.

## 2024-09-14 ENCOUNTER — Ambulatory Visit (INDEPENDENT_AMBULATORY_CARE_PROVIDER_SITE_OTHER): Payer: Self-pay

## 2024-10-16 ENCOUNTER — Ambulatory Visit: Payer: MEDICAID | Admitting: Podiatry

## 2024-11-21 ENCOUNTER — Ambulatory Visit (INDEPENDENT_AMBULATORY_CARE_PROVIDER_SITE_OTHER): Payer: Self-pay | Admitting: Primary Care

## 2024-11-21 ENCOUNTER — Ambulatory Visit (INDEPENDENT_AMBULATORY_CARE_PROVIDER_SITE_OTHER): Payer: MEDICAID | Admitting: Primary Care
